# Patient Record
Sex: Female | Born: 1965 | Race: Black or African American | Hispanic: No | Marital: Married | Smoking: Never smoker
Health system: Southern US, Community
[De-identification: ages and names within clinical notes are randomized; demographics above are authoritative.]

## PROBLEM LIST (undated history)

## (undated) DIAGNOSIS — Z87442 Personal history of urinary calculi: Secondary | ICD-10-CM

## (undated) DIAGNOSIS — M2550 Pain in unspecified joint: Secondary | ICD-10-CM

## (undated) DIAGNOSIS — E079 Disorder of thyroid, unspecified: Secondary | ICD-10-CM

## (undated) DIAGNOSIS — E039 Hypothyroidism, unspecified: Secondary | ICD-10-CM

## (undated) DIAGNOSIS — G4733 Obstructive sleep apnea (adult) (pediatric): Secondary | ICD-10-CM

## (undated) DIAGNOSIS — G4730 Sleep apnea, unspecified: Secondary | ICD-10-CM

## (undated) DIAGNOSIS — T884XXA Failed or difficult intubation, initial encounter: Secondary | ICD-10-CM

## (undated) DIAGNOSIS — E049 Nontoxic goiter, unspecified: Secondary | ICD-10-CM

## (undated) DIAGNOSIS — M1990 Unspecified osteoarthritis, unspecified site: Secondary | ICD-10-CM

## (undated) DIAGNOSIS — Z9989 Dependence on other enabling machines and devices: Secondary | ICD-10-CM

## (undated) DIAGNOSIS — K219 Gastro-esophageal reflux disease without esophagitis: Secondary | ICD-10-CM

## (undated) DIAGNOSIS — Z9889 Other specified postprocedural states: Secondary | ICD-10-CM

## (undated) DIAGNOSIS — E785 Hyperlipidemia, unspecified: Secondary | ICD-10-CM

## (undated) DIAGNOSIS — I10 Essential (primary) hypertension: Secondary | ICD-10-CM

## (undated) DIAGNOSIS — K829 Disease of gallbladder, unspecified: Secondary | ICD-10-CM

## (undated) DIAGNOSIS — R112 Nausea with vomiting, unspecified: Secondary | ICD-10-CM

## (undated) DIAGNOSIS — R7303 Prediabetes: Secondary | ICD-10-CM

## (undated) HISTORY — DX: Morbid (severe) obesity due to excess calories: E66.01

## (undated) HISTORY — DX: Prediabetes: R73.03

## (undated) HISTORY — DX: Disease of gallbladder, unspecified: K82.9

## (undated) HISTORY — DX: Pain in unspecified joint: M25.50

## (undated) HISTORY — DX: Sleep apnea, unspecified: G47.30

## (undated) HISTORY — DX: Nontoxic goiter, unspecified: E04.9

## (undated) HISTORY — DX: Obstructive sleep apnea (adult) (pediatric): G47.33

## (undated) HISTORY — DX: Hyperlipidemia, unspecified: E78.5

## (undated) HISTORY — DX: Dependence on other enabling machines and devices: Z99.89

---

## 1997-12-17 DIAGNOSIS — T884XXA Failed or difficult intubation, initial encounter: Secondary | ICD-10-CM

## 1997-12-17 HISTORY — DX: Failed or difficult intubation, initial encounter: T88.4XXA

## 1998-03-03 ENCOUNTER — Other Ambulatory Visit: Admission: RE | Admit: 1998-03-03 | Discharge: 1998-03-03 | Payer: Self-pay | Admitting: Obstetrics and Gynecology

## 1998-08-24 ENCOUNTER — Inpatient Hospital Stay (HOSPITAL_COMMUNITY): Admission: AD | Admit: 1998-08-24 | Discharge: 1998-08-24 | Payer: Self-pay | Admitting: Obstetrics and Gynecology

## 1998-08-25 HISTORY — PX: TUBAL LIGATION: SHX77

## 1998-10-06 ENCOUNTER — Other Ambulatory Visit: Admission: RE | Admit: 1998-10-06 | Discharge: 1998-10-06 | Payer: Self-pay | Admitting: Obstetrics and Gynecology

## 1999-01-23 ENCOUNTER — Encounter: Payer: Self-pay | Admitting: Family Medicine

## 1999-01-23 ENCOUNTER — Ambulatory Visit (HOSPITAL_COMMUNITY): Admission: RE | Admit: 1999-01-23 | Discharge: 1999-01-23 | Payer: Self-pay | Admitting: Family Medicine

## 2002-02-04 ENCOUNTER — Encounter: Admission: RE | Admit: 2002-02-04 | Discharge: 2002-02-04 | Payer: Self-pay | Admitting: Family Medicine

## 2002-03-11 ENCOUNTER — Encounter: Payer: Self-pay | Admitting: *Deleted

## 2002-03-11 ENCOUNTER — Encounter: Admission: RE | Admit: 2002-03-11 | Discharge: 2002-03-11 | Payer: Self-pay | Admitting: *Deleted

## 2002-04-06 ENCOUNTER — Encounter (INDEPENDENT_AMBULATORY_CARE_PROVIDER_SITE_OTHER): Payer: Self-pay | Admitting: Specialist

## 2002-04-06 ENCOUNTER — Ambulatory Visit (HOSPITAL_COMMUNITY): Admission: RE | Admit: 2002-04-06 | Discharge: 2002-04-06 | Payer: Self-pay | Admitting: *Deleted

## 2002-04-06 ENCOUNTER — Encounter: Payer: Self-pay | Admitting: *Deleted

## 2002-04-22 ENCOUNTER — Encounter (INDEPENDENT_AMBULATORY_CARE_PROVIDER_SITE_OTHER): Payer: Self-pay | Admitting: *Deleted

## 2002-04-22 ENCOUNTER — Encounter: Admission: RE | Admit: 2002-04-22 | Discharge: 2002-04-22 | Payer: Self-pay | Admitting: Family Medicine

## 2002-07-14 ENCOUNTER — Encounter: Admission: RE | Admit: 2002-07-14 | Discharge: 2002-07-14 | Payer: Self-pay | Admitting: Family Medicine

## 2002-09-10 ENCOUNTER — Encounter: Admission: RE | Admit: 2002-09-10 | Discharge: 2002-09-10 | Payer: Self-pay | Admitting: Family Medicine

## 2002-09-16 ENCOUNTER — Encounter: Admission: RE | Admit: 2002-09-16 | Discharge: 2002-09-16 | Payer: Self-pay | Admitting: Family Medicine

## 2002-09-16 ENCOUNTER — Ambulatory Visit (HOSPITAL_COMMUNITY): Admission: RE | Admit: 2002-09-16 | Discharge: 2002-09-16 | Payer: Self-pay | Admitting: Family Medicine

## 2002-09-25 ENCOUNTER — Encounter: Payer: Self-pay | Admitting: Family Medicine

## 2002-09-25 ENCOUNTER — Inpatient Hospital Stay (HOSPITAL_COMMUNITY): Admission: AD | Admit: 2002-09-25 | Discharge: 2002-09-29 | Payer: Self-pay | Admitting: Family Medicine

## 2002-09-25 ENCOUNTER — Encounter: Admission: RE | Admit: 2002-09-25 | Discharge: 2002-09-25 | Payer: Self-pay | Admitting: Family Medicine

## 2002-10-05 ENCOUNTER — Encounter: Admission: RE | Admit: 2002-10-05 | Discharge: 2002-10-05 | Payer: Self-pay | Admitting: Family Medicine

## 2002-10-09 ENCOUNTER — Encounter (INDEPENDENT_AMBULATORY_CARE_PROVIDER_SITE_OTHER): Payer: Self-pay | Admitting: *Deleted

## 2002-10-09 ENCOUNTER — Inpatient Hospital Stay (HOSPITAL_COMMUNITY): Admission: RE | Admit: 2002-10-09 | Discharge: 2002-10-11 | Payer: Self-pay | Admitting: Surgery

## 2002-10-30 HISTORY — PX: THYROIDECTOMY: SHX17

## 2002-12-07 ENCOUNTER — Encounter: Admission: RE | Admit: 2002-12-07 | Discharge: 2002-12-07 | Payer: Self-pay | Admitting: Family Medicine

## 2002-12-24 ENCOUNTER — Encounter: Admission: RE | Admit: 2002-12-24 | Discharge: 2002-12-24 | Payer: Self-pay | Admitting: Family Medicine

## 2003-01-04 ENCOUNTER — Encounter: Admission: RE | Admit: 2003-01-04 | Discharge: 2003-01-04 | Payer: Self-pay | Admitting: Family Medicine

## 2003-02-17 ENCOUNTER — Encounter: Admission: RE | Admit: 2003-02-17 | Discharge: 2003-02-17 | Payer: Self-pay | Admitting: Sports Medicine

## 2003-03-10 ENCOUNTER — Encounter: Admission: RE | Admit: 2003-03-10 | Discharge: 2003-03-10 | Payer: Self-pay | Admitting: Family Medicine

## 2003-05-30 ENCOUNTER — Emergency Department (HOSPITAL_COMMUNITY): Admission: EM | Admit: 2003-05-30 | Discharge: 2003-05-31 | Payer: Self-pay | Admitting: Emergency Medicine

## 2003-05-31 ENCOUNTER — Encounter: Payer: Self-pay | Admitting: Emergency Medicine

## 2003-08-13 ENCOUNTER — Encounter: Admission: RE | Admit: 2003-08-13 | Discharge: 2003-08-13 | Payer: Self-pay | Admitting: Family Medicine

## 2003-09-15 ENCOUNTER — Encounter: Admission: RE | Admit: 2003-09-15 | Discharge: 2003-09-15 | Payer: Self-pay | Admitting: Family Medicine

## 2004-03-06 ENCOUNTER — Encounter: Admission: RE | Admit: 2004-03-06 | Discharge: 2004-03-06 | Payer: Self-pay | Admitting: Family Medicine

## 2004-11-24 ENCOUNTER — Ambulatory Visit: Payer: Self-pay | Admitting: Sports Medicine

## 2004-12-14 ENCOUNTER — Ambulatory Visit: Payer: Self-pay | Admitting: Family Medicine

## 2005-01-03 ENCOUNTER — Ambulatory Visit: Payer: Self-pay | Admitting: Family Medicine

## 2005-03-28 ENCOUNTER — Encounter: Admission: RE | Admit: 2005-03-28 | Discharge: 2005-03-28 | Payer: Self-pay | Admitting: Sports Medicine

## 2005-04-06 ENCOUNTER — Encounter (INDEPENDENT_AMBULATORY_CARE_PROVIDER_SITE_OTHER): Payer: Self-pay | Admitting: Specialist

## 2005-04-06 ENCOUNTER — Ambulatory Visit: Payer: Self-pay | Admitting: Sports Medicine

## 2005-04-10 ENCOUNTER — Ambulatory Visit: Payer: Self-pay | Admitting: Sports Medicine

## 2005-04-19 ENCOUNTER — Ambulatory Visit: Payer: Self-pay | Admitting: Family Medicine

## 2005-07-12 ENCOUNTER — Ambulatory Visit: Payer: Self-pay | Admitting: Family Medicine

## 2005-08-03 ENCOUNTER — Ambulatory Visit: Payer: Self-pay | Admitting: Family Medicine

## 2006-02-12 ENCOUNTER — Ambulatory Visit: Payer: Self-pay | Admitting: Family Medicine

## 2006-08-16 ENCOUNTER — Encounter: Admission: RE | Admit: 2006-08-16 | Discharge: 2006-08-16 | Payer: Self-pay | Admitting: *Deleted

## 2006-11-15 HISTORY — PX: BREAST SURGERY: SHX581

## 2009-12-05 ENCOUNTER — Emergency Department (HOSPITAL_COMMUNITY): Admission: EM | Admit: 2009-12-05 | Discharge: 2009-12-05 | Payer: Self-pay | Admitting: Emergency Medicine

## 2011-01-07 ENCOUNTER — Encounter: Payer: Self-pay | Admitting: *Deleted

## 2011-03-19 LAB — DIFFERENTIAL
Basophils Absolute: 0 10*3/uL (ref 0.0–0.1)
Basophils Relative: 0 % (ref 0–1)
Lymphocytes Relative: 21 % (ref 12–46)
Monocytes Relative: 5 % (ref 3–12)
Neutro Abs: 5.7 10*3/uL (ref 1.7–7.7)
Neutrophils Relative %: 73 % (ref 43–77)

## 2011-03-19 LAB — COMPREHENSIVE METABOLIC PANEL
Alkaline Phosphatase: 70 U/L (ref 39–117)
BUN: 15 mg/dL (ref 6–23)
Creatinine, Ser: 0.97 mg/dL (ref 0.4–1.2)
Glucose, Bld: 126 mg/dL — ABNORMAL HIGH (ref 70–99)
Potassium: 4.5 mEq/L (ref 3.5–5.1)
Total Bilirubin: 1.3 mg/dL — ABNORMAL HIGH (ref 0.3–1.2)
Total Protein: 7.5 g/dL (ref 6.0–8.3)

## 2011-03-19 LAB — URINALYSIS, ROUTINE W REFLEX MICROSCOPIC
Bilirubin Urine: NEGATIVE
Glucose, UA: NEGATIVE mg/dL
Ketones, ur: NEGATIVE mg/dL
Protein, ur: 30 mg/dL — AB
pH: 5.5 (ref 5.0–8.0)

## 2011-03-19 LAB — GC/CHLAMYDIA PROBE AMP, GENITAL
Chlamydia, DNA Probe: NEGATIVE
GC Probe Amp, Genital: NEGATIVE

## 2011-03-19 LAB — CBC
HCT: 36.7 % (ref 36.0–46.0)
Hemoglobin: 12.4 g/dL (ref 12.0–15.0)
MCV: 91.6 fL (ref 78.0–100.0)
RDW: 13.7 % (ref 11.5–15.5)

## 2011-03-19 LAB — URINE MICROSCOPIC-ADD ON

## 2011-03-19 LAB — WET PREP, GENITAL

## 2011-05-02 ENCOUNTER — Encounter: Payer: Self-pay | Admitting: Family Medicine

## 2011-05-02 ENCOUNTER — Ambulatory Visit (INDEPENDENT_AMBULATORY_CARE_PROVIDER_SITE_OTHER): Payer: Medicaid Other | Admitting: Family Medicine

## 2011-05-02 VITALS — BP 141/93 | HR 102 | Temp 98.6°F | Ht 68.0 in | Wt 325.8 lb

## 2011-05-02 DIAGNOSIS — I10 Essential (primary) hypertension: Secondary | ICD-10-CM | POA: Insufficient documentation

## 2011-05-02 DIAGNOSIS — E669 Obesity, unspecified: Secondary | ICD-10-CM

## 2011-05-02 DIAGNOSIS — G4733 Obstructive sleep apnea (adult) (pediatric): Secondary | ICD-10-CM | POA: Insufficient documentation

## 2011-05-02 DIAGNOSIS — E890 Postprocedural hypothyroidism: Secondary | ICD-10-CM | POA: Insufficient documentation

## 2011-05-02 DIAGNOSIS — G4730 Sleep apnea, unspecified: Secondary | ICD-10-CM

## 2011-05-02 MED ORDER — LEVOTHYROXINE SODIUM 125 MCG PO TABS: 125.0000 ug | ORAL_TABLET | Freq: Every day | ORAL | Status: DC

## 2011-05-02 MED ORDER — HYDROCHLOROTHIAZIDE 25 MG PO TABS: 25.0000 mg | ORAL_TABLET | Freq: Every day | ORAL | Status: DC

## 2011-05-02 MED ORDER — ENALAPRIL MALEATE 20 MG PO TABS: 20.0000 mg | ORAL_TABLET | Freq: Two times a day (BID) | ORAL | Status: DC

## 2011-05-02 NOTE — Progress Notes (Signed)
  Subjective:    Patient ID: Paula Austin, female    DOB: 12-16-1966, 45 y.o.   MRN: [id number]  HPI Establishing care. History of hypertension and total thyroidectomy. Wt up since thyroidectomy Also had old sleep study showing "severe sleep apnea", but never given CPAP No complaints - feels well except would like to lose wt Switching due to financial reasons    Review of Systems     Objective:   Physical Exam Neck thyroidectomy scar well healed Cardiac RRR without m Abd benign.       Assessment & Plan:

## 2011-05-03 LAB — COMPLETE METABOLIC PANEL WITH GFR
AST: 14 U/L (ref 0–37)
Albumin: 4.2 g/dL (ref 3.5–5.2)
Alkaline Phosphatase: 78 U/L (ref 39–117)
Potassium: 4.8 mEq/L (ref 3.5–5.3)
Sodium: 136 mEq/L (ref 135–145)
Total Protein: 7.6 g/dL (ref 6.0–8.3)

## 2011-05-04 ENCOUNTER — Encounter: Payer: Self-pay | Admitting: Family Medicine

## 2011-05-04 NOTE — Assessment & Plan Note (Signed)
Sleep study, sounds like she really needs CPAP,  Snore, children say she stops breathing, has excessive daytime sleepiness.

## 2011-05-04 NOTE — Op Note (Signed)
NAME:  Paula Austin, Paula Austin                       ACCOUNT NO.:  [id number]   MEDICAL RECORD NO.:  [id number]                   PATIENT TYPE:  OIB   LOCATION:  2550                                 FACILITY:  [facility]   PHYSICIAN:  Velora Heckler, M.D.                DATE OF BIRTH:  01/14/1966   DATE OF PROCEDURE:  10/09/2002  DATE OF DISCHARGE:                                 OPERATIVE REPORT   PREOPERATIVE DIAGNOSIS:  Thyroid goiter with atypia.   POSTOPERATIVE DIAGNOSIS:  Thyroid goiter with atypia.   PROCEDURE:  Total thyroidectomy.   SURGEON:  Velora Heckler, M.D.   ASSISTANT:  Gita Kudo, M.D.   ANESTHESIA:  General per Maren Beach, M.D.   ESTIMATED BLOOD LOSS:  500 cc.   PREPARATION:  Betadine.   COMPLICATIONS:  None.   DRAINS:  48 [place].   INDICATIONS:  The patient is a 45 year old black female who presents with  thyroid goiter.  This has been present for approximately four years.  It has  slowly progressed in size.  The patient has developed compressive symptoms.  She had a recent admission to [facility] for thyroid storm.  She is  currently on PTU and beta blockade.  The patient had undergone fine needle  aspiration cytology.  This showed moderate nuclear irregularity and  microfollicles.  The possibility of a papillary lesion in the left thyroid  lobe was raised.  The patient now comes to surgery for total thyroidectomy.   DESCRIPTION OF PROCEDURE:  The procedure was done in OR #15 at the [facility].  The patient is brought to the operating room and  placed in a supine position on the operating room table.  Following  administration of general anesthesia, the patient is prepped and draped in  the usual strict aseptic fashion.  This is a large anterior goiter.  After  ascertaining that an adequate level of anesthesia had been obtained, a  transverse Kocher incision is made with a #11 blade.  Dissection was carried  down through the subcutaneous tissues and platysma and hemostasis obtained  with the electrocautery.  Skin flaps are developed cephalad and caudad.  A  Mahorner self-retaining retractor is placed for exposure.  Strap muscles are  incised in the midline and dissection is begun on the left side of the neck.  Strap muscles are reflected laterally.  The gland is quite large anteriorly.  It extends posteriorly and superiorly a great distance.  This is a quite  large goiter.  Venous tributaries are dissected out and ligated in  continuity with 3-0 silk ties and divided.  The gland is rolled anteriorly.  Inferior venous tributaries are ligated in continuity with 3-0 silk ties and  divided.  The superior pole was dissected out.  Again large venous  tributaries are ligated in continuity with 2-0 silk ties and div died.  Smaller vessels are divided between medium Ligaclips.  The superior thyroid  artery is ligated in continuity with 2-0 silk ties as well as medium  Ligaclips and then divided.  The gland is rolled anteriorly.  The superior  and inferior parathyroid glands are identified on the left side, and both  glands are preserved on their vascular pedicles.  The recurrent laryngeal  nerve was identified and preserved.  Branches of the inferior thyroid artery  are divided between small and medium Ligaclips.  The gland was rolled  anteriorly.  The tubercle of Zuckerkandl is dissected out.  Ligament of  Allyson Sabal is transected, and the gland is rolled up and onto the anterior  trachea.  Venous tributaries coming from the superior midline are also  divided between medium Ligaclips.  Hemostasis is obtained with the  electrocautery.  Next we turned our attention to the right side.  Again  strap muscles are reflected laterally.  Strap muscles are markedly  attenuated due to the size of the goiter.  Venous tributaries are ligated in  continuity with 2-0 silk ties and divided.  The gland is rolled  anteriorly.  Venous tributaries to the superior pole are ligated in continuity between 2-  0 silk ties and div died.  Superior pole vessels are dissected out and  ligated between 2-0 silk ties and divided.  The smaller vessels are divided  between medium Ligaclips.  The gland is rolled further anteriorly.  There is  moderate back bleeding at this point from the large venous collaterals on  the surface of the thyroid gland.  This makes visualization difficult.  Despite multiple attempts at a meticulous dissection on the right neck,  visualization cannot be maintained.  Therefore, a decision is made to leave  a small rim of thyroid tissue in the area of the parathyroid glands and  recurrent nerve.  Therefore, the thyroid tissue is divided between hemostats  near the trachea.  The inferior thyroid veins are ligated with 2-0 silk  ties.  The gland is then excised off the anterior trachea and anterior  thyroid cartilage in its entirety.  The gland is inspected and then the left  superior pole marked with a suture ligature and submitted to pathology for  review.  Hemostats on the residual thyroid tissue are suture ligated with 3-  0 Vicryl suture ligatures.  Good hemostasis is noted.  The neck is irrigated  on both sides.  The venous collaterals are ligated with 2-0 silk ties.  Surgicel is placed over the area of the recurrent laryngeal nerve  bilaterally.  Good hemostasis is noted.  A 15 [place] Blake drain is brought  in from a left lateral stab wound and placed into the thyroid bed.  Strap  muscles are then reapproximated in the midline with interrupted 3-0 Vicryl  sutures.  Platysma is reapproximated with interrupted 3-0 Vicryl sutures.  Skin edges are reapproximated with widely-spaced stainless steel staples and  interspaced Benzoin and Steri-Strips.  Sterile gauze dressings were applied.  The drain was placed to bulb suction.  The patient is awakened from anesthesia and brought to the  recovery room in stable condition.  The  patient tolerated the procedure well.                                               Velora Heckler, M.D.  TMG/MEDQ  D:  10/09/2002  T:  10/09/2002  Job:  [id number]   cc:   Solon Palm, D.O.

## 2011-05-04 NOTE — Assessment & Plan Note (Signed)
Refill meds, check TSH

## 2011-05-04 NOTE — Assessment & Plan Note (Signed)
Poor control, increase enalapril

## 2011-05-04 NOTE — Discharge Summary (Signed)
NAME:  MOZELL, HABER                       ACCOUNT NO.:  [id number]   MEDICAL RECORD NO.:  [id number]                   PATIENT TYPE:  INP   LOCATION:  3710                                 FACILITY:  [facility]   PHYSICIAN:  Leighton Roach McDiarmid, M.D.             DATE OF BIRTH:  14-Nov-1966   DATE OF ADMISSION:  09/25/2002  DATE OF DISCHARGE:  09/29/2002                                 DISCHARGE SUMMARY   DISCHARGE DIAGNOSES:  1. 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  2. Thyroid goiter.  3. Gastroesophageal reflux disease.   DISCHARGE MEDICATIONS:  1. Propylthiouracil 15 mg 4 pills p.o. t.i.d. x 10 days.  2. Metoprolol 150 mg 1 tab p.o. b.i.d. x 10 days.  3. __________ solution or SSKI 5 drops under the tongue t.i.d. x 10 days.   FOLLOW UP:  She will be discharged with an appointment to see Dr. Renold Don at the  [facility] [facility] on October 05, 2002, at 10:05 a.m. and Dr. Ardine Eng  office will call her to set up an appointment prior to her surgery which is  scheduled for October 09, 2002.   HOSPITAL COURSE:  Ms. [name] is a 45 year old female, recently diagnosed  with hypothyroidism with a goiter, status post fine needle aspiration which  showed atypical cells, who is scheduled for a thyroidectomy on October 09, 2002, who over the past couple of weeks has had increased agitation and  inability to sleep as well as tremor and persistent vomiting and anorexia.  The patient had a 16 pound weight loss over the course of two weeks prior to  admission, complains of heart racing, palpitations, vomiting, and  tremulousness over the course of the two weeks prior to hospitalization.  Please see admission H&P for further details.   PHYSICAL EXAMINATION ON ADMISSION:  VITAL SIGNS:  Temperature 97.5, heart  rate 100, blood pressure 136/80.  GENERAL:  Well-developed, well-nourished female.  Of note, on physical exam  at admission, the patient has no proptosis, a large midline tissue swelling  on her neck  which is not tender.  LUNGS:  Clear to auscultation bilaterally.  CARDIOVASCULAR:  She is tachycardic with 2/6 systolic ejection murmur.  ABDOMEN:  She is obese.   LABORATORY DATA:  TSH on September 16, 2002, was less than 0.004.  EKG on  September 16, 2002, showed sinus tachycardia with no acute abnormalities.   PROBLEM LIST:  1. Hypothyroidism with thyroid storm.  T4 and T3 were extremely elevated,     her T4 being 28.  Free T4 being 8.54.  Her T3 being 680.1.  The patient     was admitted, placed on beta blockers which were titrated up to alleviate     symptoms.  She was then placed on PTU to help manage.  Dr. Gerrit Friends was     contacted, and he requested that we place the patient on __________     solution or SSKI prior  to surgery and that we should get some lab work     prior to her surgery and that he would see her a couple of days before     her surgery to make sure she was not symptomatic.  On the day of     discharge, the patient was no longer tremulous and maintained heart rates     under 105.  Felt that she would be comfortable going home and increasing     her beta blocker as an outpatient if necessary.  2. Gastroesophageal reflux disease.  The patient is currently controlled     with Protonix.  This will be continued as an outpatient.   SUGGESTED FOLLOW UP ITEMS:  The patient has an appointment with Dr. Renold Don, on  October 05, 2002.  It is suggested that he repeat her lab work at that time  and cc a copy of it to Dr. Gerrit Friends.     Maylon Peppers Waynette Buttery, M.D.                     Etta Grandchild, M.D.    SAG/MEDQ  D:  09/29/2002  T:  09/30/2002  Job:  [id number]   cc:   Emmit Alexanders, M.D.  [address]  [place], [place] [address]  Fax: [phone]   Velora Heckler, M.D.  Fax: [phone]

## 2011-05-09 ENCOUNTER — Telehealth: Payer: Self-pay | Admitting: Family Medicine

## 2011-05-09 NOTE — Telephone Encounter (Signed)
Checking status of referral to get a sleep study

## 2011-05-09 NOTE — Telephone Encounter (Signed)
Informed pt that the sleep study lab will call her about the date and time of her appt.Marland KitchenMarland KitchenLoralee Pacas Austin

## 2011-06-13 ENCOUNTER — Ambulatory Visit (HOSPITAL_BASED_OUTPATIENT_CLINIC_OR_DEPARTMENT_OTHER): Payer: Medicaid Other | Attending: Family Medicine

## 2011-06-13 DIAGNOSIS — I491 Atrial premature depolarization: Secondary | ICD-10-CM | POA: Insufficient documentation

## 2011-06-13 DIAGNOSIS — G4733 Obstructive sleep apnea (adult) (pediatric): Secondary | ICD-10-CM | POA: Insufficient documentation

## 2011-06-13 DIAGNOSIS — G4730 Sleep apnea, unspecified: Secondary | ICD-10-CM

## 2011-06-23 DIAGNOSIS — G4733 Obstructive sleep apnea (adult) (pediatric): Secondary | ICD-10-CM

## 2011-06-23 DIAGNOSIS — I491 Atrial premature depolarization: Secondary | ICD-10-CM

## 2011-06-23 NOTE — Procedures (Signed)
NAMELUVA, METZGER             ACCOUNT NO.:  [id number]  MEDICAL RECORD NO.:  [id number]          PATIENT TYPE:  OUT  LOCATION:  SLEEP CENTER                 FACILITY:  [facility]  PHYSICIAN:  Clinton D. Maple Hudson, MD, FCCP, FACPDATE OF BIRTH:  [birth year] 09, 1966  DATE OF STUDY:  06/13/2011                           NOCTURNAL POLYSOMNOGRAM  REFERRING PHYSICIAN:  Richrd Prime HENSEL  INDICATION FOR STUDY:  Hypersomnia with sleep apnea.  EPWORTH SLEEPINESS SCORE:  18/24, BMI 48.7.  Weight 320 pounds, height 68 inches.  Neck 20 inches.  MEDICATIONS:  Home medications are charted and reviewed.  SLEEP ARCHITECTURE:  Split-study protocol.  During the diagnostic phase, total sleep time 147 minutes with sleep efficiency 79.9%.  Stage I was 24.5%, stage II 46.3%, stage III absent, REM 29.3% of total sleep time. Sleep latency 14.5 minutes, REM latency 39 minutes, awake after sleep onset 21.5 minutes, arousal index 78.  BEDTIME MEDICATION:  None.  RESPIRATORY DATA:  Split-study protocol.  Apnea-hypopnea index (AHI) 101.2 per hour.  A total of 248 events was scored including 177 obstructive apneas, 20 mixed apneas and 51 hypopneas.  The events were not positional.  REM/AHI 100.5 per hour.  CPAP was then titrated to 17 CWP, AHI 0 per hour.  She wore a medium ResMed [employer] FX full- face mask with heated humidifier and C-flex of 3.  OXYGEN DATA:  Before CPAP, snoring was moderately loud with oxygen desaturation to a nadir of 68% on room air.  With CPAP titration, snoring was prevented and mean oxygen saturation held 95% on room air.  CARDIAC DATA:  Sinus rhythm with occasional PAC.  MOVEMENT-PARASOMNIA:  No significant movement disturbance.  Bathroom x1.  IMPRESSIONS-RECOMMENDATIONS: 1. Severe obstructive sleep apnea/hypopnea syndrome, AHI 101.2 per     hour with non positional events, moderately loud snoring at oxygen     desaturation to a nadir of 68% on room air. 2. Successful CPAP  titration to 17 CWP, AHI 0 per hour.  She wore a     medium ResMed [employer]age Quattro FX full-face mask with heated     humidifier and a C-Flex setting of 3.     Clinton D. Maple Hudson, MD, [facility], FACP Diplomate, Biomedical engineer of Sleep Medicine Electronically Signed    CDY/MEDQ  D:  06/23/2011 11:52:35  T:  06/23/2011 13:18:30  Job:  [id number]

## 2011-06-25 ENCOUNTER — Telehealth: Payer: Self-pay | Admitting: Family Medicine

## 2011-06-25 DIAGNOSIS — G4730 Sleep apnea, unspecified: Secondary | ICD-10-CM

## 2011-06-25 NOTE — Telephone Encounter (Signed)
Pt is requesting sleep study results. Would like message left on her vm since she is going to work.

## 2011-06-26 NOTE — Assessment & Plan Note (Signed)
Severe sleep apnea based on sleep study of 05/2011

## 2011-06-26 NOTE — Telephone Encounter (Signed)
Exam results show severe sleep apnea.  Will put order in for [facility] to fit for home CPAP.

## 2011-06-27 ENCOUNTER — Telehealth: Payer: Self-pay | Admitting: Family Medicine

## 2011-06-27 NOTE — Telephone Encounter (Signed)
Dr. Leveda Anna, Can you please write an rx for this. ---[place] Dec

## 2011-06-27 NOTE — Telephone Encounter (Signed)
Paula Austin has called again about the prescription for the CPAP and a copy of the sleep study.

## 2011-06-27 NOTE — Telephone Encounter (Signed)
rec'd referral but needs a copy of sleep study and also needs rx script for the CPAP pls fax to [phone] attn [place]

## 2011-06-27 NOTE — Telephone Encounter (Signed)
done

## 2011-06-27 NOTE — Telephone Encounter (Signed)
Faxed out

## 2011-06-27 NOTE — Telephone Encounter (Signed)
LM on patient's VM informing below. I am faxing referral to [facility].

## 2011-11-23 ENCOUNTER — Ambulatory Visit (INDEPENDENT_AMBULATORY_CARE_PROVIDER_SITE_OTHER): Payer: Medicaid Other | Admitting: Family Medicine

## 2011-11-23 ENCOUNTER — Other Ambulatory Visit (HOSPITAL_COMMUNITY)
Admission: RE | Admit: 2011-11-23 | Discharge: 2011-11-23 | Disposition: A | Discharge status: Home / Self Care | Payer: Medicaid Other | Source: Ambulatory Visit | Attending: Family Medicine | Admitting: Family Medicine

## 2011-11-23 VITALS — BP 140/89 | HR 102 | Temp 97.7°F | Ht 68.0 in | Wt 327.0 lb

## 2011-11-23 DIAGNOSIS — Z01419 Encounter for gynecological examination (general) (routine) without abnormal findings: Secondary | ICD-10-CM | POA: Insufficient documentation

## 2011-11-23 DIAGNOSIS — Z124 Encounter for screening for malignant neoplasm of cervix: Secondary | ICD-10-CM

## 2011-11-23 DIAGNOSIS — E890 Postprocedural hypothyroidism: Secondary | ICD-10-CM

## 2011-11-23 DIAGNOSIS — I10 Essential (primary) hypertension: Secondary | ICD-10-CM

## 2011-11-23 DIAGNOSIS — Z23 Encounter for immunization: Secondary | ICD-10-CM

## 2011-11-23 DIAGNOSIS — N912 Amenorrhea, unspecified: Secondary | ICD-10-CM | POA: Insufficient documentation

## 2011-11-23 DIAGNOSIS — Z1239 Encounter for other screening for malignant neoplasm of breast: Secondary | ICD-10-CM

## 2011-11-23 DIAGNOSIS — Z1231 Encounter for screening mammogram for malignant neoplasm of breast: Secondary | ICD-10-CM

## 2011-11-23 LAB — CBC
HCT: 39.2 % (ref 36.0–46.0)
Hemoglobin: 12.9 g/dL (ref 12.0–15.0)
MCV: 91 fL (ref 78.0–100.0)
RBC: 4.31 MIL/uL (ref 3.87–5.11)
WBC: 8.5 10*3/uL (ref 4.0–10.5)

## 2011-11-23 LAB — LIPID PANEL
HDL: 58 mg/dL (ref >39)
LDL Cholesterol: 122 mg/dL — ABNORMAL HIGH (ref 0–99)
Total CHOL/HDL Ratio: 3.4 Ratio

## 2011-11-23 MED ORDER — ASPIRIN EC 325 MG PO TBEC: 325.0000 mg | DELAYED_RELEASE_TABLET | Freq: Every day | ORAL | Status: AC

## 2011-11-23 NOTE — Progress Notes (Signed)
  Subjective:    Patient ID: Paula Austin, female    DOB: [birth year], 45 y.o.   MRN: [id number]  HPI Las menstrual period about 3.5 months ago.  Not sexually active.  Periods were regular until then. Is having some flushing of face and tingling of arms that could be consistent with a hot flash.    Wonders if her thyroid might be abnormal.  She had previous similar tingling and flushing which was attributed to thyroid problems.  She is exercising more and eating healthier.  Unfortunately, this has not yet translated to wt loss.    Review of Systems     Objective:   Physical Exam HEENT normal Neck supple Cardiac RRR without m Lungs clear abd benign. Pelvic and Pap done.  Grossly normal but limited sensitivity of  bimanual due to her obesity.       Assessment & Plan:

## 2011-11-23 NOTE — Patient Instructions (Addendum)
Please start taking one aspirin per day.   Good job on exercising more. I will call with lab results.  The likely problem is either with your thyroid or that you are menopausal or perimenopausal. Great job with more exercise.  This will help even if you do not lose weight. Please get your mammogram [facility] [place] [place], [place]

## 2011-11-24 LAB — BASIC METABOLIC PANEL WITH GFR
Calcium: 9.8 mg/dL (ref 8.4–10.5)
GFR, Est African American: >89 mL/min
Glucose, Bld: 92 mg/dL (ref 70–99)
Sodium: 136 mEq/L (ref 135–145)

## 2011-11-27 ENCOUNTER — Encounter: Payer: Self-pay | Admitting: Family Medicine

## 2011-11-28 ENCOUNTER — Encounter: Payer: Self-pay | Admitting: Family Medicine

## 2011-11-28 NOTE — Assessment & Plan Note (Signed)
Likely perimenopausal.  Will check FSH and TSH.

## 2011-11-28 NOTE — Assessment & Plan Note (Signed)
Given risk factors, will add daily aspirin to regimine.

## 2011-11-28 NOTE — Assessment & Plan Note (Signed)
Check TSH 

## 2011-12-21 ENCOUNTER — Ambulatory Visit
Admission: RE | Admit: 2011-12-21 | Discharge: 2011-12-21 | Disposition: A | Discharge status: Home / Self Care | Payer: Medicaid Other | Source: Ambulatory Visit | Attending: Family Medicine | Admitting: Family Medicine

## 2011-12-21 DIAGNOSIS — Z1231 Encounter for screening mammogram for malignant neoplasm of breast: Secondary | ICD-10-CM

## 2012-06-05 ENCOUNTER — Other Ambulatory Visit: Payer: Self-pay | Admitting: Family Medicine

## 2012-06-24 ENCOUNTER — Ambulatory Visit: Payer: Medicaid Other | Admitting: Family Medicine

## 2012-12-01 ENCOUNTER — Encounter (HOSPITAL_COMMUNITY): Payer: Self-pay | Admitting: Adult Health

## 2012-12-01 ENCOUNTER — Emergency Department (HOSPITAL_COMMUNITY)
Admission: EM | Admit: 2012-12-01 | Discharge: 2012-12-01 | Disposition: A | Discharge status: Home / Self Care | Payer: Self-pay | Attending: Emergency Medicine | Admitting: Emergency Medicine

## 2012-12-01 DIAGNOSIS — E079 Disorder of thyroid, unspecified: Secondary | ICD-10-CM | POA: Insufficient documentation

## 2012-12-01 DIAGNOSIS — M545 Low back pain, unspecified: Secondary | ICD-10-CM | POA: Insufficient documentation

## 2012-12-01 DIAGNOSIS — Z79899 Other long term (current) drug therapy: Secondary | ICD-10-CM | POA: Insufficient documentation

## 2012-12-01 DIAGNOSIS — I10 Essential (primary) hypertension: Secondary | ICD-10-CM | POA: Insufficient documentation

## 2012-12-01 HISTORY — DX: Disorder of thyroid, unspecified: E07.9

## 2012-12-01 HISTORY — DX: Essential (primary) hypertension: I10

## 2012-12-01 LAB — URINALYSIS, ROUTINE W REFLEX MICROSCOPIC
Hgb urine dipstick: NEGATIVE
Nitrite: NEGATIVE
Protein, ur: NEGATIVE mg/dL
Urobilinogen, UA: 1 mg/dL (ref 0.0–1.0)

## 2012-12-01 LAB — URINE MICROSCOPIC-ADD ON

## 2012-12-01 MED ORDER — KETOROLAC TROMETHAMINE 60 MG/2ML IM SOLN
60.0000 mg | Freq: Once | INTRAMUSCULAR | Status: AC
Start: 2012-12-01 — End: 2012-12-01
  Administered 2012-12-01: 60 mg via INTRAMUSCULAR
  Filled 2012-12-01: qty 2

## 2012-12-01 MED ORDER — DIAZEPAM 5 MG PO TABS
10.0000 mg | ORAL_TABLET | Freq: Once | ORAL | Status: AC
  Administered 2012-12-01: 10 mg via ORAL
  Filled 2012-12-01: qty 1

## 2012-12-01 MED ORDER — METHOCARBAMOL 500 MG PO TABS: 1000.0000 mg | ORAL_TABLET | Freq: Four times a day (QID) | ORAL | Status: DC | PRN

## 2012-12-01 MED ORDER — DIAZEPAM 5 MG PO TABS
ORAL_TABLET | ORAL | Status: AC
  Filled 2012-12-01: qty 1

## 2012-12-01 MED ORDER — TRAMADOL-ACETAMINOPHEN 37.5-325 MG PO TABS: ORAL_TABLET | ORAL | Status: DC

## 2012-12-01 MED ORDER — PREDNISONE 20 MG PO TABS: ORAL_TABLET | ORAL | Status: DC

## 2012-12-01 NOTE — ED Notes (Signed)
Family at bedside. 

## 2012-12-01 NOTE — ED Notes (Signed)
PT reports back pain started after bending over in the shower. Pt reports a neg. Hx of back injuries.

## 2012-12-01 NOTE — ED Notes (Signed)
Presents with lumbar back pain that began yesterday after getting out of shower pain is described as constant and cramping and is worse with bending over and waliking. Pain radiates to lower abdomen. Pt denies injury to back. Denies urinary symptoms. Pain is rated 6/10.  Pressure against back makes pain better.

## 2012-12-01 NOTE — ED Provider Notes (Signed)
History   This chart was scribed for Ward Givens, MD by Gerlean Ren, ED Scribe. This patient was seen in room TR07C/TR07C and the patient's care was started at 4:31 PM    CSN: [id number]  Arrival date & time 12/01/12  1443   First MD Initiated Contact with Patient 12/01/12 1606      Chief Complaint  Patient presents with  . Back Pain    The history is provided by the patient. No language interpreter was used.   MARIEA MCMARTIN is a 46 y.o. female with h/o HTN and thyroid disease who presents to the Emergency Department complaining of sudden onset, constant, moderate, aching, non-radiating lower back pain that began yesterday morning while bending down in the shower to wash her feet  and is worsened when changing positions and improved when she presses on her back while laying down and by warm compress and Icy-Hot compress.  Pt denies urinary or rectal incontinence and numbness or weakness in extremities.  Pain does not radiate. Pt denies any h/o similar back problems.  Pt denies tobacco and alcohol use.    PCP is Dr. Leveda Anna [facility]  Past Medical History  Diagnosis Date  . Hypertension   . Thyroid disease     History reviewed. No pertinent past surgical history.  History reviewed. No pertinent family history.  History  Substance Use Topics  . Smoking status: Never Smoker   . Smokeless tobacco: Never Used  . Alcohol Use: occassional  employed  No OB history provided.   Review of Systems  HENT: Negative for neck pain.   Musculoskeletal: Positive for back pain.  Neurological: Negative for weakness and numbness.    Allergies  Review of patient's allergies indicates no known allergies.  Home Medications   Current Outpatient Rx  Name  Route  Sig  Dispense  Refill  . VITAMIN D 1000 UNITS PO TABS   Oral   Take 1,000 Units by mouth daily. OTC med          . ENALAPRIL MALEATE 20 MG PO TABS   Oral   Take 20 mg by mouth 2 (two) times daily.         . GUAIFENESIN ER  600 MG PO TB12   Oral   Take 1,200 mg by mouth 2 (two) times daily as needed. For congestion         . HYDROCHLOROTHIAZIDE 25 MG PO TABS   Oral   Take 25 mg by mouth daily.         . IBUPROFEN 200 MG PO TABS   Oral   Take 400-600 mg by mouth every 6 (six) hours as needed. For pain         . LEVOTHYROXINE SODIUM 125 MCG PO TABS   Oral   Take 125 mcg by mouth daily.           BP 157/89  Pulse 87  Temp 98.5 F (36.9 C) (Oral)  Resp 16  SpO2 99%  Vital signs normal    Physical Exam  Nursing note and vitals reviewed. Constitutional: She is oriented to person, place, and time. She appears well-developed and well-nourished.  Non-toxic appearance. She does not appear ill. No distress.       obese  HENT:  Head: Normocephalic and atraumatic.  Right Ear: External ear normal.  Left Ear: External ear normal.  Nose: Nose normal. No mucosal edema or rhinorrhea.  Mouth/Throat: Oropharynx is clear and moist and mucous membranes are normal.  No dental abscesses or uvula swelling.  Eyes: Conjunctivae normal and EOM are normal. Pupils are equal, round, and reactive to light.  Neck: Normal range of motion and full passive range of motion without pain. Neck supple.  Cardiovascular: Normal rate, regular rhythm and normal heart sounds.  Exam reveals no gallop and no friction rub.   No murmur heard. Pulmonary/Chest: Effort normal and breath sounds normal. No respiratory distress. She has no wheezes. She has no rhonchi. She has no rales. She exhibits no tenderness and no crepitus.  Abdominal: Normal appearance.  Musculoskeletal: She exhibits tenderness. She exhibits no edema.       Lower lumbar and sacral tenderness.  Non-tender over cervical and thoracic and upper lumbar spine.  Non-tender over SIJ bilaterally.  On ROM at the waist no pain on lateral movement, forward flexion reproduces pain. Patellar reflexes +2 and = bilaterally. Neg SLR bilaterally  Neurological: She is alert and  oriented to person, place, and time. She has normal strength. No cranial nerve deficit.  Skin: Skin is warm, dry and intact. No rash noted. No erythema. No pallor.  Psychiatric: She has a normal mood and affect. Her speech is normal and behavior is normal. Her mood appears not anxious.    ED Course  Procedures (including critical care time)   Medications  ketorolac (TORADOL) injection 60 mg (60 mg Intramuscular Given 12/01/12 1651)  diazepam (VALIUM) tablet 10 mg (10 mg Oral Given 12/01/12 1651)    DIAGNOSTIC STUDIES: Oxygen Saturation is 99% on room air, normal by my interpretation.    COORDINATION OF CARE: 4:39 PM- Patient informed of clinical course, understands medical decision-making process, and agrees with plan.  Ordered IM Toradol, IM Valium, and urinalysis.  Advised follow-up with Dr. Leveda Anna if symptoms worsen.  Results for orders placed during the hospital encounter of 12/01/12  URINALYSIS, ROUTINE W REFLEX MICROSCOPIC      Component Value Range   Color, Urine YELLOW  YELLOW   APPearance CLOUDY (*) CLEAR   Specific Gravity, Urine 1.026  1.005 - 1.030   pH 6.0  5.0 - 8.0   Glucose, UA NEGATIVE  NEGATIVE mg/dL   Hgb urine dipstick NEGATIVE  NEGATIVE   Bilirubin Urine NEGATIVE  NEGATIVE   Ketones, ur NEGATIVE  NEGATIVE mg/dL   Protein, ur NEGATIVE  NEGATIVE mg/dL   Urobilinogen, UA 1.0  0.0 - 1.0 mg/dL   Nitrite NEGATIVE  NEGATIVE   Leukocytes, UA MODERATE (*) NEGATIVE  URINE MICROSCOPIC-ADD ON      Component Value Range   Squamous Epithelial / LPF MANY (*) RARE   WBC, UA 3-6  <3 WBC/hpf   RBC / HPF 0-2  <3 RBC/hpf   Bacteria, UA FEW (*) RARE   Urine-Other TRICHOMONAS PRESENT     Laboratory interpretation all normal except contaminated UA     1. Low back pain     New Prescriptions   METHOCARBAMOL (ROBAXIN) 500 MG TABLET    Take 2 tablets (1,000 mg total) by mouth 4 (four) times daily as needed.   PREDNISONE (DELTASONE) 20 MG TABLET    Take 3 po QD x 3d ,  then 2 po QD x 3d then 1 po QD x 3d   TRAMADOL-ACETAMINOPHEN (ULTRACET) 37.5-325 MG PER TABLET    2 tabs po QID prn pain    Plan discharge  Devoria Albe, MD, FACEP   MDM    I personally performed the services described in this documentation, which was scribed in my presence. The  recorded information has been reviewed and considered.  Devoria Albe, MD, FACEP        Ward Givens, MD 12/01/12 [phone]

## 2012-12-03 LAB — URINE CULTURE: Colony Count: 45000

## 2013-04-17 ENCOUNTER — Ambulatory Visit: Payer: Medicaid Other | Admitting: Family Medicine

## 2013-04-24 ENCOUNTER — Ambulatory Visit: Payer: Medicaid Other | Admitting: Family Medicine

## 2013-05-13 ENCOUNTER — Ambulatory Visit (INDEPENDENT_AMBULATORY_CARE_PROVIDER_SITE_OTHER): Payer: BC Managed Care – PPO | Admitting: Surgery

## 2013-05-13 ENCOUNTER — Encounter (INDEPENDENT_AMBULATORY_CARE_PROVIDER_SITE_OTHER): Payer: Self-pay | Admitting: Surgery

## 2013-05-13 NOTE — Patient Instructions (Addendum)

## 2013-05-13 NOTE — Progress Notes (Signed)
Chief Complaint:  Morbid obesity BMI 52  History of Present Illness:  Paula Austin is an 47 y.o. female who has been to 2 of our seminars and is interested in lap gastric bypass.  I discussed this procedure with her in detail including complications not limited to anastomotic leaks. She is very knowledgeable patient and his study this. She has questions. She has had multiple attempts at weight loss and has been in the gym and his work out a lot. She noted a lot of weight loss after she had a thyroidectomy back in early 2006. Her weight is up to 343. I questioned her about Deltasone listed under medications but she only was given that for back spasms several months ago and didn't take it. I explained that with the gastric bypass she cannot take NSAIDs or steroids.    Past Medical History  Diagnosis Date  . Hypertension   . Thyroid disease     History reviewed. No pertinent past surgical history.  Current Outpatient Prescriptions  Medication Sig Dispense Refill  . cholecalciferol (VITAMIN D) 1000 UNITS tablet Take 1,000 Units by mouth daily. OTC med       . enalapril (VASOTEC) 20 MG tablet Take 20 mg by mouth 2 (two) times daily.      . hydrochlorothiazide (HYDRODIURIL) 25 MG tablet Take 25 mg by mouth daily.      Marland Kitchen ibuprofen (ADVIL,MOTRIN) 200 MG tablet Take 400-600 mg by mouth every 6 (six) hours as needed. For pain      . levothyroxine (SYNTHROID, LEVOTHROID) 125 MCG tablet Take 125 mcg by mouth daily.       No current facility-administered medications for this visit.   Review of patient's allergies indicates no known allergies. Family History  Problem Relation Age of Onset  . Cancer Maternal Grandfather     lung   Social History:   reports that she has never smoked. She has never used smokeless tobacco. She reports that she does not drink alcohol or use illicit drugs.   REVIEW OF SYSTEMS - PERTINENT POSITIVES ONLY: No DVT  Physical Exam:   Blood pressure 138/84, pulse 76,  temperature 97.4 F (36.3 C), temperature source Temporal, resp. rate 14, height 5\' 8"  (1.727 m), weight 343 lb 3.2 oz (155.674 kg). Body mass index is 52.2 kg/(m^2).  Gen:  WDWN AAF NAD  Neurological: Alert and oriented to person, place, and time. Motor and sensory function is grossly intact  Head: Normocephalic and atraumatic.  Eyes: Conjunctivae are normal. Pupils are equal, round, and reactive to light. No scleral icterus.  Neck: Normal range of motion. Neck supple. No tracheal deviation or thyromegaly present.  Cardiovascular:  SR without murmurs or gallops.  No carotid bruits Respiratory: Effort normal.  No respiratory distress. No chest wall tenderness. Breath sounds normal.  No wheezes, rales or rhonchi.  Abdomen:  Prior tubal ligation GU: Musculoskeletal: Normal range of motion. Extremities are nontender. No cyanosis, edema or clubbing noted Lymphadenopathy: No cervical, preauricular, postauricular or axillary adenopathy is present Skin: Skin is warm and dry. No rash noted. No diaphoresis. No erythema. No pallor. Pscyh: Normal mood and affect. Behavior is normal. Judgment and thought content normal.   LABORATORY RESULTS: No results found for this or any previous visit (from the past 48 hour(s)).  RADIOLOGY RESULTS: No results found.  Problem List: Patient Active Problem List   Diagnosis Date Noted  . Breast cancer screening 11/23/2011  . Amenorrhea 11/23/2011  . 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  Hypertension 05/02/2011  . Sleep apnea 05/02/2011  . Obesity 05/02/2011    Assessment & Plan: Morbid obesity and desire lap gastric bypass.      Matt B. Daphine Deutscher, MD, [facility] [phone] beeper [phone]  05/13/2013 4:20 PM

## 2013-05-22 ENCOUNTER — Encounter: Payer: Self-pay | Admitting: Family Medicine

## 2013-05-22 ENCOUNTER — Ambulatory Visit (INDEPENDENT_AMBULATORY_CARE_PROVIDER_SITE_OTHER): Payer: BC Managed Care – PPO | Admitting: Family Medicine

## 2013-05-22 VITALS — BP 148/102 | HR 91 | Temp 98.1°F | Ht 68.0 in | Wt 340.3 lb

## 2013-05-22 DIAGNOSIS — E890 Postprocedural hypothyroidism: Secondary | ICD-10-CM

## 2013-05-22 DIAGNOSIS — I10 Essential (primary) hypertension: Secondary | ICD-10-CM

## 2013-05-22 DIAGNOSIS — Z0000 Encounter for general adult medical examination without abnormal findings: Secondary | ICD-10-CM | POA: Insufficient documentation

## 2013-05-22 DIAGNOSIS — N951 Menopausal and female climacteric states: Secondary | ICD-10-CM | POA: Insufficient documentation

## 2013-05-22 DIAGNOSIS — E669 Obesity, unspecified: Secondary | ICD-10-CM

## 2013-05-22 DIAGNOSIS — L8990 Pressure ulcer of unspecified site, unspecified stage: Secondary | ICD-10-CM | POA: Insufficient documentation

## 2013-05-22 LAB — LIPID PANEL
Cholesterol: 215 mg/dL — ABNORMAL HIGH (ref 0–200)
HDL: 56 mg/dL (ref >39)
LDL Cholesterol: 145 mg/dL — ABNORMAL HIGH (ref 0–99)
Total CHOL/HDL Ratio: 3.8 Ratio
Triglycerides: 68 mg/dL (ref <150)
VLDL: 14 mg/dL (ref 0–40)

## 2013-05-22 LAB — COMPLETE METABOLIC PANEL WITH GFR
ALT: 28 U/L (ref 0–35)
AST: 19 U/L (ref 0–37)
Alkaline Phosphatase: 76 U/L (ref 39–117)
BUN: 10 mg/dL (ref 6–23)
Chloride: 101 mEq/L (ref 96–112)
Creat: 0.75 mg/dL (ref 0.50–1.10)
Potassium: 4 mEq/L (ref 3.5–5.3)

## 2013-05-22 LAB — CBC
HCT: 38.2 % (ref 36.0–46.0)
MCHC: 34 g/dL (ref 30.0–36.0)
MCV: 88.2 fL (ref 78.0–100.0)
Platelets: 257 10*3/uL (ref 150–400)
RDW: 14.2 % (ref 11.5–15.5)
WBC: 7.4 10*3/uL (ref 4.0–10.5)

## 2013-05-22 LAB — TSH: TSH: 3.182 u[IU]/mL (ref 0.350–4.500)

## 2013-05-22 MED ORDER — ENALAPRIL MALEATE 20 MG PO TABS
20.0000 mg | ORAL_TABLET | Freq: Two times a day (BID) | ORAL | Status: DC
Start: 2013-05-22 — End: 2013-08-31

## 2013-05-22 MED ORDER — LEVOTHYROXINE SODIUM 125 MCG PO TABS: 125.0000 ug | ORAL_TABLET | Freq: Every day | ORAL | Status: DC

## 2013-05-22 MED ORDER — HYDROCHLOROTHIAZIDE 25 MG PO TABS: 25.0000 mg | ORAL_TABLET | Freq: Every day | ORAL | Status: DC

## 2013-05-22 NOTE — Assessment & Plan Note (Signed)
Needs TSH.

## 2013-05-22 NOTE — Patient Instructions (Signed)
I am glad you signed up for MyChart. I am glad you are planning bariatric surgery.  The cows are still in the barn. Your blood pressure is up.  Get it checked several times over the next month.  We will then decide whether to increase your meds. See me in one month.   Make sure you have them send mammogram results to me.

## 2013-05-22 NOTE — Assessment & Plan Note (Addendum)
Begin medically supervised weight loss.  Will see nutritionist through bariatric surg program.

## 2013-05-22 NOTE — Assessment & Plan Note (Signed)
Poor control today.  Unclear whether due to missed doses or I need to increase meds.

## 2013-05-22 NOTE — Progress Notes (Signed)
  Subjective:    Patient ID: Paula Austin, female    DOB: 1966/10/29, 47 y.o.   MRN: [id number]  HPI Plans Bariatric surg.  Needs letter for medical necessity. Has appointment for mammogram. Up to date on screening/immunizations. BP up but has missed some doses.  Needs blood work - over 1 year since last check.  No complaints except for inability to lose wt.  Exercising regularly.    Review of Systems     Objective:   Physical ExamBP noted HEENT nl Neck supple without thyromegally Lungs clear Cardiac RRR without m or g abd benign Ext 1+ edema        Assessment & Plan:

## 2013-05-22 NOTE — Assessment & Plan Note (Signed)
Having irregular menses.

## 2013-05-22 NOTE — Assessment & Plan Note (Signed)
Blood work and begin medically supervised weight loss

## 2013-05-25 ENCOUNTER — Encounter: Payer: Self-pay | Admitting: Family Medicine

## 2013-05-26 ENCOUNTER — Ambulatory Visit (HOSPITAL_COMMUNITY)
Admission: RE | Admit: 2013-05-26 | Discharge: 2013-05-26 | Disposition: A | Discharge status: Home / Self Care | Payer: BC Managed Care – PPO | Source: Ambulatory Visit | Attending: Surgery | Admitting: Surgery

## 2013-05-26 ENCOUNTER — Encounter (HOSPITAL_COMMUNITY)
Admission: RE | Disposition: A | Discharge status: Home / Self Care | Payer: Self-pay | Source: Ambulatory Visit | Attending: Surgery

## 2013-05-26 HISTORY — PX: BREATH TEK H PYLORI: SHX5422

## 2013-05-26 SURGERY — BREATH TEST, FOR HELICOBACTER PYLORI

## 2013-05-27 ENCOUNTER — Encounter (HOSPITAL_COMMUNITY): Payer: Self-pay | Admitting: Surgery

## 2013-06-04 LAB — CBC WITH DIFFERENTIAL/PLATELET
Basophils Absolute: 0 10*3/uL (ref 0.0–0.1)
Eosinophils Relative: 2 % (ref 0–5)
Lymphocytes Relative: 36 % (ref 12–46)
Lymphs Abs: 2.9 10*3/uL (ref 0.7–4.0)
MCV: 90.3 fL (ref 78.0–100.0)
Neutrophils Relative %: 55 % (ref 43–77)
Platelets: 246 10*3/uL (ref 150–400)
RBC: 4.11 MIL/uL (ref 3.87–5.11)
RDW: 13.7 % (ref 11.5–15.5)
WBC: 8 10*3/uL (ref 4.0–10.5)

## 2013-06-04 LAB — T4: T4, Total: 7.1 ug/dL (ref 5.0–12.5)

## 2013-06-11 ENCOUNTER — Other Ambulatory Visit: Payer: Self-pay | Admitting: Family Medicine

## 2013-06-11 DIAGNOSIS — Z1231 Encounter for screening mammogram for malignant neoplasm of breast: Secondary | ICD-10-CM

## 2013-06-12 ENCOUNTER — Ambulatory Visit (HOSPITAL_COMMUNITY)
Admission: RE | Admit: 2013-06-12 | Discharge: 2013-06-12 | Disposition: A | Discharge status: Home / Self Care | Payer: BC Managed Care – PPO | Source: Ambulatory Visit | Attending: Surgery | Admitting: Surgery

## 2013-06-12 ENCOUNTER — Other Ambulatory Visit: Payer: Self-pay

## 2013-06-12 DIAGNOSIS — G4730 Sleep apnea, unspecified: Secondary | ICD-10-CM | POA: Insufficient documentation

## 2013-06-12 DIAGNOSIS — M4180 Other forms of scoliosis, site unspecified: Secondary | ICD-10-CM | POA: Insufficient documentation

## 2013-06-12 DIAGNOSIS — E079 Disorder of thyroid, unspecified: Secondary | ICD-10-CM | POA: Insufficient documentation

## 2013-06-12 DIAGNOSIS — Z6841 Body Mass Index (BMI) 40.0 and over, adult: Secondary | ICD-10-CM | POA: Insufficient documentation

## 2013-06-12 DIAGNOSIS — K828 Other specified diseases of gallbladder: Secondary | ICD-10-CM | POA: Insufficient documentation

## 2013-06-12 DIAGNOSIS — K7689 Other specified diseases of liver: Secondary | ICD-10-CM | POA: Insufficient documentation

## 2013-06-12 DIAGNOSIS — I10 Essential (primary) hypertension: Secondary | ICD-10-CM | POA: Insufficient documentation

## 2013-06-17 ENCOUNTER — Encounter: Payer: BC Managed Care – PPO | Attending: Surgery | Admitting: *Deleted

## 2013-06-17 ENCOUNTER — Encounter: Payer: Self-pay | Admitting: *Deleted

## 2013-06-17 VITALS — Ht 68.0 in | Wt 350.9 lb

## 2013-06-17 DIAGNOSIS — E669 Obesity, unspecified: Secondary | ICD-10-CM | POA: Insufficient documentation

## 2013-06-17 DIAGNOSIS — Z713 Dietary counseling and surveillance: Secondary | ICD-10-CM | POA: Insufficient documentation

## 2013-06-17 NOTE — Patient Instructions (Addendum)
   Follow Pre-Op Nutrition Goals to prepare for Gastric Bypass Surgery.   Call the [facility] at [phone] once you have been given your surgery date to enrolled in the Pre-Op Nutrition Class. You will need to attend this nutrition class 3-4 weeks prior to your surgery. 

## 2013-06-17 NOTE — Progress Notes (Signed)
  Pre-Op Assessment Visit:  Pre-Operative RYGB Surgery  Medical Nutrition Therapy:  Appt start time: 1530   End time:  1630.  Patient was seen on 06/17/2013 for Pre-Operative RYGB Nutrition Assessment. Assessment and letter of approval faxed to [facility] Program coordinator on 06/17/2013.  Approval letter sent to [employer] [facility] center and will be available in the chart under the media tab.  Handouts given during visit include:  Pre-Op Goals   Bariatric Surgery Protein Shakes  Patient to call for Pre-Op and Post-Op Nutrition Education at the [facility] when surgery is scheduled.

## 2013-06-18 ENCOUNTER — Ambulatory Visit (HOSPITAL_COMMUNITY)
Admission: RE | Admit: 2013-06-18 | Discharge: 2013-06-18 | Disposition: A | Discharge status: Home / Self Care | Payer: BC Managed Care – PPO | Source: Ambulatory Visit | Attending: Family Medicine | Admitting: Family Medicine

## 2013-06-18 DIAGNOSIS — Z1231 Encounter for screening mammogram for malignant neoplasm of breast: Secondary | ICD-10-CM | POA: Insufficient documentation

## 2013-06-29 ENCOUNTER — Other Ambulatory Visit: Payer: Self-pay | Admitting: *Deleted

## 2013-06-29 DIAGNOSIS — E890 Postprocedural hypothyroidism: Secondary | ICD-10-CM

## 2013-06-29 MED ORDER — LEVOTHYROXINE SODIUM 125 MCG PO TABS: 125.0000 ug | ORAL_TABLET | Freq: Every day | ORAL | Status: DC

## 2013-06-29 NOTE — Telephone Encounter (Signed)
Yes, OK to switch

## 2013-06-29 NOTE — Telephone Encounter (Signed)
[facility] is changing manufacturers of levothyroxine from [employer] to [employer] - would like to know if this is ok for this pt? Wyatt Haste, RN-BSN

## 2013-07-15 ENCOUNTER — Other Ambulatory Visit: Payer: Self-pay | Admitting: *Deleted

## 2013-07-15 DIAGNOSIS — E890 Postprocedural hypothyroidism: Secondary | ICD-10-CM

## 2013-07-15 DIAGNOSIS — I10 Essential (primary) hypertension: Secondary | ICD-10-CM

## 2013-07-15 MED ORDER — LEVOTHYROXINE SODIUM 125 MCG PO TABS: 125.0000 ug | ORAL_TABLET | Freq: Every day | ORAL | Status: DC

## 2013-07-15 MED ORDER — HYDROCHLOROTHIAZIDE 25 MG PO TABS: 25.0000 mg | ORAL_TABLET | Freq: Every day | ORAL | Status: DC

## 2013-07-17 ENCOUNTER — Ambulatory Visit: Payer: BC Managed Care – PPO | Admitting: Family Medicine

## 2013-08-17 DIAGNOSIS — R112 Nausea with vomiting, unspecified: Secondary | ICD-10-CM

## 2013-08-17 DIAGNOSIS — Z9889 Other specified postprocedural states: Secondary | ICD-10-CM

## 2013-08-17 HISTORY — DX: Other specified postprocedural states: Z98.890

## 2013-08-17 HISTORY — DX: Other specified postprocedural states: R11.2

## 2013-08-20 ENCOUNTER — Encounter: Payer: BC Managed Care – PPO | Attending: Surgery | Admitting: *Deleted

## 2013-08-20 VITALS — Ht 68.0 in | Wt 352.5 lb

## 2013-08-20 DIAGNOSIS — Z713 Dietary counseling and surveillance: Secondary | ICD-10-CM | POA: Insufficient documentation

## 2013-08-20 DIAGNOSIS — E669 Obesity, unspecified: Secondary | ICD-10-CM

## 2013-08-23 NOTE — Progress Notes (Signed)
  Pre-Operative Nutrition Class:  Appt start time: 1730   End time:  1830.  Patient was seen on 08/20/13 for Pre-Operative Bariatric Surgery Education at [facility].   Surgery date: 09/07/13 Surgery type: RYGB Start weight at [facility]: 350.9 lbs (06/17/13) Weight today: 352.5 lbs   TANITA  BODY COMP RESULTS  08/20/13   BMI (kg/m^2) 53.6   Fat Mass (lbs) 180.5   Fat Free Mass (lbs) 172.0   Total Body Water (lbs) 126.0   Samples given per MNT protocol.  Patient educated on appropriate usage. Bariatric Advantage Multivitamin Lot # [id number]; Exp: 12/15  Bariatric Advantage Sublingual B12 Lot # [id number]; Exp: 10/15  Celebrate Vitamins Multivitamin Lot # [id number]; Exp: 01/15  Celebrate Vitamins Calcium Citrate Lot # [id number]; Exp: 01/16  BariActiv Multivitamin Lot # [id number]; Exp: 05/16  BariActiv Calcium Citrate Lot # [id number]; Exp: 05/16  BariActiv Iron + Vit C Lot # [id number]; Exp: 05/16  Unjury Protein Powder Lot # [id number]; Exp: 09/15  The following the learning objectives were met by the patient during this course:  Identify Pre-Op Dietary Goals and will begin 2 weeks pre-operatively  Identify appropriate sources of fluids and proteins   State protein recommendations and appropriate sources pre and post-operatively  Identify Post-Operative Dietary Goals and will follow for 2 weeks post-operatively  Identify appropriate multivitamin and calcium sources  Describe the need for physical activity post-operatively and will follow MD recommendations  State when to call healthcare provider regarding medication questions or post-operative complications  Handouts given during class include:  Pre-Op Bariatric Surgery Diet Handout  Protein Shake Handout  Post-Op Bariatric Surgery Nutrition Handout  [facility]ELT Program Information Flyer  Support Group Information Flyer  [facility] Bariatric Supplements Price List  Follow-Up Plan: Patient will  follow-up at [facility] 2 weeks post operatively for diet advancement per MD.

## 2013-08-23 NOTE — Patient Instructions (Signed)
Follow:  Pre-Op Diet per MD 2 weeks prior to surgery  Phase 2- Liquids (clear/full) 2 weeks after surgery  Vitamin/Mineral/Calcium guidelines for purchasing bariatric supplements  Exercise guidelines pre and post-op per MD  Follow-up at [facility] in 2 weeks post-op for diet advancement. Contact Tajai Suder at [email or web address] or [phone] as needed with questions/concerns.   

## 2013-08-31 ENCOUNTER — Encounter (HOSPITAL_COMMUNITY): Payer: Self-pay | Admitting: Pharmacy Technician

## 2013-08-31 NOTE — Progress Notes (Signed)
Dr. Daphine Deutscher,  Please enter preop orders in Epic for Paula Austin - she is coming to [facility] Thursday  9/18 for her preop labs / visit.  Thanks.

## 2013-09-02 ENCOUNTER — Other Ambulatory Visit (HOSPITAL_COMMUNITY): Payer: Self-pay | Admitting: Surgery

## 2013-09-02 NOTE — Patient Instructions (Addendum)
[address] Spruell  09/02/2013   Your procedure is scheduled on: 09-07-2013  MONDAY  Report to [facility] at 515 AM.  Call this number if you have problems the morning of surgery [phone] BRING CPAP MASK AND TUBING WITH YOU TO HOSPITAL  Remember:   Do not eat food or drink liquids :After Midnight. Sunday NIGHT     Take these medicines the morning of surgery with A SIP OF WATER:  Levothyroxine                                SEE [facility] PREPARING FOR SURGERY SHEET             You may not have any metal on your body including hair pins and piercings  Do not wear jewelry, make-up.  Do not wear lotions, powders, or perfumes. You may wear deodorant.   Men may shave face and neck.  Do not bring valuables to the hospital. [facility] IS NOT RESPONSIBLE FOR VALUEABLES.  Contacts, dentures or bridgework may not be worn into surgery.  Leave suitcase in the car. After surgery it may be brought to your room.  For patients admitted to the hospital, checkout time is 11:00 AM the day of discharge.   Patients discharged the day of surgery will not be allowed to drive home.  Name and phone number of your driver:family  Special Instructions: N/A   Please read over the following fact sheets that you were given:   Call Theodis Aguas  RN pre op nurse if needed [phone]    FAILURE TO FOLLOW THESE INSTRUCTIONS MAY RESULT IN THE CANCELLATION OF YOUR SURGERY.  PATIENT SIGNATURE___________________________________________  NURSE SIGNATURE_____________________________________________

## 2013-09-02 NOTE — Progress Notes (Signed)
Chest xray 2 view 06-12-13 epic ekg 06-12-13 epic

## 2013-09-03 ENCOUNTER — Encounter (HOSPITAL_COMMUNITY)
Admission: RE | Admit: 2013-09-03 | Discharge: 2013-09-03 | Disposition: A | Discharge status: Home / Self Care | Payer: BC Managed Care – PPO | Source: Ambulatory Visit | Attending: Surgery | Admitting: Surgery

## 2013-09-03 ENCOUNTER — Ambulatory Visit (INDEPENDENT_AMBULATORY_CARE_PROVIDER_SITE_OTHER): Payer: BC Managed Care – PPO | Admitting: Surgery

## 2013-09-03 ENCOUNTER — Encounter (HOSPITAL_COMMUNITY): Payer: Self-pay

## 2013-09-03 ENCOUNTER — Encounter (INDEPENDENT_AMBULATORY_CARE_PROVIDER_SITE_OTHER): Payer: Self-pay | Admitting: Surgery

## 2013-09-03 VITALS — BP 146/90 | HR 80 | Temp 98.1°F | Resp 14 | Ht 68.0 in | Wt 340.8 lb

## 2013-09-03 DIAGNOSIS — E669 Obesity, unspecified: Secondary | ICD-10-CM

## 2013-09-03 DIAGNOSIS — Z01812 Encounter for preprocedural laboratory examination: Secondary | ICD-10-CM | POA: Insufficient documentation

## 2013-09-03 DIAGNOSIS — Z01818 Encounter for other preprocedural examination: Secondary | ICD-10-CM | POA: Insufficient documentation

## 2013-09-03 HISTORY — DX: Gastro-esophageal reflux disease without esophagitis: K21.9

## 2013-09-03 HISTORY — DX: Personal history of urinary calculi: Z87.442

## 2013-09-03 LAB — CBC
Hemoglobin: 12.8 g/dL (ref 12.0–15.0)
MCH: 30.1 pg (ref 26.0–34.0)
MCV: 90.1 fL (ref 78.0–100.0)
Platelets: 265 10*3/uL (ref 150–400)
RBC: 4.25 MIL/uL (ref 3.87–5.11)

## 2013-09-03 LAB — BASIC METABOLIC PANEL
BUN: 14 mg/dL (ref 6–23)
CO2: 26 mEq/L (ref 19–32)
Calcium: 9.7 mg/dL (ref 8.4–10.5)
Creatinine, Ser: 0.66 mg/dL (ref 0.50–1.10)
Glucose, Bld: 108 mg/dL — ABNORMAL HIGH (ref 70–99)

## 2013-09-03 NOTE — Progress Notes (Signed)
Chief Complaint:  Morbid obesity BMI 52:  Wants to proceed with lap roux en Y gastric bypass  History of Present Illness:  Paula Austin is an 47 y.o. female who has been to 2 of our seminars and is interested in lap gastric bypass.  I discussed this procedure with her in detail including complications not limited to anastomotic leaks. She is very knowledgeable patient and his study this. She has questions. She has had multiple attempts at weight loss and has been in the gym and his work out a lot. She noted a lot of weight loss after she had a thyroidectomy back in early 2006. Her weight is 340.  BMI 52.  Ultrasound showed a polyp,  UGI negative.   I questioned her about Deltasone listed under medications but she only was given that for back spasms several months ago and didn't take it. I explained that with the gastric bypass she cannot take NSAIDs or steroids.    Questions answered and patient ready for roux Y gastric bypass.  I gave her a sample of ImBev to drink the evening before her surgery.    Past Medical History  Diagnosis Date  . Hypertension   . Thyroid disease   . Hyperlipidemia   . OSA on CPAP   . Goiter   . Morbid obesity   . History of kidney stones   . GERD (gastroesophageal reflux disease)     Past Surgical History  Procedure Laterality Date  . Breath tek h pylori N/A 05/26/2013    Procedure: BREATH TEK H PYLORI;  Surgeon: Valarie Merino, MD;  Location: [facility];  Service: General;  Laterality: N/A;  . Thyroidectomy  10/30/02  . Breast surgery  11/15/06    Reduction  . Tubal ligation  08/25/98    Current Outpatient Prescriptions  Medication Sig Dispense Refill  . cholecalciferol (VITAMIN D) 1000 UNITS tablet Take 1,000 Units by mouth daily. OTC med       . cyanocobalamin 1000 MCG tablet Place 100 mcg under the tongue daily.      . enalapril (VASOTEC) 20 MG tablet Take 20 mg by mouth 2 (two) times daily.      . hydrochlorothiazide (HYDRODIURIL) 25 MG tablet  Take 25 mg by mouth daily.      Marland Kitchen levothyroxine (SYNTHROID, LEVOTHROID) 125 MCG tablet Take 125 mcg by mouth daily before breakfast.      . phenylephrine (SUDAFED PE) 10 MG TABS tablet Take 10 mg by mouth every 4 (four) hours as needed.       No current facility-administered medications for this visit.   Oxycodone Family History  Problem Relation Age of Onset  . Cancer Maternal Grandfather     lung  . Diabetes Father   . Diabetes Paternal Grandmother    Social History:   reports that she has never smoked. She has never used smokeless tobacco. She reports that she does not drink alcohol or use illicit drugs.   REVIEW OF SYSTEMS - PERTINENT POSITIVES ONLY: No DVT  Physical Exam:   Blood pressure 146/90, pulse 80, temperature 98.1 F (36.7 C), temperature source Temporal, resp. rate 14, height 5\' 8"  (1.727 m), weight 340 lb 12.8 oz (154.586 kg), last menstrual period 07/03/2013. Body mass index is 51.83 kg/(m^2).  Gen:  WDWN AAF NAD  Neurological: Alert and oriented to person, place, and time. Motor and sensory function is grossly intact  Head: Normocephalic and atraumatic.  Eyes: Conjunctivae are normal. Pupils are equal, round, and  reactive to light. No scleral icterus.  Neck: Normal range of motion. Neck supple. No tracheal deviation or thyromegaly present.  Cardiovascular:  SR without murmurs or gallops.  No carotid bruits Respiratory: Effort normal.  No respiratory distress. No chest wall tenderness. Breath sounds normal.  No wheezes, rales or rhonchi.  Abdomen:  Prior tubal ligation GU: Musculoskeletal: Normal range of motion. Extremities are nontender. No cyanosis, edema or clubbing noted Lymphadenopathy: No cervical, preauricular, postauricular or axillary adenopathy is present Skin: Skin is warm and dry. No rash noted. No diaphoresis. No erythema. No pallor. Pscyh: Normal mood and affect. Behavior is normal. Judgment and thought content normal.   LABORATORY  RESULTS: Results for orders placed during the hospital encounter of 09/03/13 (from the past 48 hour(s))  CBC     Status: None   Collection Time    09/03/13  8:40 AM      Result Value Range   WBC 6.0  4.0 - 10.5 K/uL   RBC 4.25  3.87 - 5.11 MIL/uL   Hemoglobin 12.8  12.0 - 15.0 g/dL   HCT 09.8  11.9 - 14.7 %   MCV 90.1  78.0 - 100.0 fL   MCH 30.1  26.0 - 34.0 pg   MCHC 33.4  30.0 - 36.0 g/dL   RDW 82.9  56.2 - 13.0 %   Platelets 265  150 - 400 K/uL  HCG, SERUM, QUALITATIVE     Status: None   Collection Time    09/03/13  8:40 AM      Result Value Range   Preg, Serum NEGATIVE  NEGATIVE   Comment:            THE SENSITIVITY OF THIS     METHODOLOGY IS >10 mIU/mL.    RADIOLOGY RESULTS: No results found.  Problem List: Patient Active Problem List   Diagnosis Date Noted  . Routine general medical examination at a health care facility 05/22/2013  . Perimenopausal 05/22/2013  . 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 Hypertension 05/02/2011  . Sleep apnea 05/02/2011  . morbid obesity BMI 52 05/02/2011    Assessment & Plan: For Roux en Y gastric bypass on Monday    Matt B. Daphine Deutscher, MD, [facility] [phone] beeper [phone]  09/03/2013 10:18 AM

## 2013-09-03 NOTE — Patient Instructions (Signed)

## 2013-09-03 NOTE — Progress Notes (Signed)
Dr Daphine Deutscher- need pre op orders please  Thanks

## 2013-09-07 ENCOUNTER — Encounter (HOSPITAL_COMMUNITY): Payer: Self-pay | Admitting: *Deleted

## 2013-09-07 ENCOUNTER — Encounter (HOSPITAL_COMMUNITY): Payer: Self-pay | Admitting: Anesthesiology

## 2013-09-07 ENCOUNTER — Inpatient Hospital Stay (HOSPITAL_COMMUNITY): Payer: BC Managed Care – PPO | Admitting: Anesthesiology

## 2013-09-07 ENCOUNTER — Encounter (HOSPITAL_COMMUNITY)
Admission: RE | Disposition: A | Discharge status: Home / Self Care | Payer: Self-pay | Source: Ambulatory Visit | Attending: Surgery

## 2013-09-07 ENCOUNTER — Other Ambulatory Visit (INDEPENDENT_AMBULATORY_CARE_PROVIDER_SITE_OTHER): Payer: Self-pay | Admitting: Surgery

## 2013-09-07 ENCOUNTER — Inpatient Hospital Stay (HOSPITAL_COMMUNITY)
Admission: RE | Admit: 2013-09-07 | Discharge: 2013-09-09 | DRG: 288 | Disposition: A | Discharge status: Home / Self Care | Payer: BC Managed Care – PPO | Source: Ambulatory Visit | Attending: Surgery | Admitting: Surgery

## 2013-09-07 DIAGNOSIS — Z79899 Other long term (current) drug therapy: Secondary | ICD-10-CM

## 2013-09-07 DIAGNOSIS — Z6841 Body Mass Index (BMI) 40.0 and over, adult: Secondary | ICD-10-CM

## 2013-09-07 DIAGNOSIS — Z9884 Bariatric surgery status: Secondary | ICD-10-CM

## 2013-09-07 DIAGNOSIS — E785 Hyperlipidemia, unspecified: Secondary | ICD-10-CM | POA: Diagnosis present

## 2013-09-07 DIAGNOSIS — K219 Gastro-esophageal reflux disease without esophagitis: Secondary | ICD-10-CM | POA: Diagnosis present

## 2013-09-07 DIAGNOSIS — G4733 Obstructive sleep apnea (adult) (pediatric): Secondary | ICD-10-CM | POA: Diagnosis present

## 2013-09-07 DIAGNOSIS — E669 Obesity, unspecified: Secondary | ICD-10-CM

## 2013-09-07 DIAGNOSIS — I10 Essential (primary) hypertension: Secondary | ICD-10-CM

## 2013-09-07 DIAGNOSIS — Z01812 Encounter for preprocedural laboratory examination: Secondary | ICD-10-CM

## 2013-09-07 HISTORY — DX: Failed or difficult intubation, initial encounter: T88.4XXA

## 2013-09-07 HISTORY — PX: GASTRIC ROUX-EN-Y: SHX5262

## 2013-09-07 LAB — CREATININE, SERUM: Creatinine, Ser: 0.82 mg/dL (ref 0.50–1.10)

## 2013-09-07 LAB — CBC
MCH: 30.3 pg (ref 26.0–34.0)
MCHC: 33.4 g/dL (ref 30.0–36.0)
Platelets: 217 10*3/uL (ref 150–400)
RDW: 13.3 % (ref 11.5–15.5)

## 2013-09-07 SURGERY — LAPAROSCOPIC ROUX-EN-Y GASTRIC
Anesthesia: General | Wound class: Clean Contaminated

## 2013-09-07 MED ORDER — INFLUENZA VAC SPLIT QUAD 0.5 ML IM SUSP
0.5000 mL | INTRAMUSCULAR | Status: AC
  Administered 2013-09-09: 0.5 mL via INTRAMUSCULAR
  Filled 2013-09-07 (×2): qty 0.5

## 2013-09-07 MED ORDER — EPHEDRINE SULFATE 50 MG/ML IJ SOLN
INTRAMUSCULAR | Status: DC | PRN
  Administered 2013-09-07: 5 mg via INTRAVENOUS

## 2013-09-07 MED ORDER — CISATRACURIUM BESYLATE (PF) 10 MG/5ML IV SOLN
INTRAVENOUS | Status: DC | PRN
  Administered 2013-09-07: 4 mg via INTRAVENOUS
  Administered 2013-09-07: 10 mg via INTRAVENOUS
  Administered 2013-09-07: 4 mg via INTRAVENOUS
  Administered 2013-09-07: 10 mg via INTRAVENOUS
  Administered 2013-09-07 (×2): 2 mg via INTRAVENOUS

## 2013-09-07 MED ORDER — DEXTROSE 5 % IV SOLN
2.0000 g | INTRAVENOUS | Status: DC | PRN
  Administered 2013-09-07: 2 g via INTRAVENOUS

## 2013-09-07 MED ORDER — LACTATED RINGERS IV SOLN
INTRAVENOUS | Status: DC | PRN
  Administered 2013-09-07 (×3): via INTRAVENOUS

## 2013-09-07 MED ORDER — KETOROLAC TROMETHAMINE 30 MG/ML IJ SOLN: 15.0000 mg | Freq: Once | INTRAMUSCULAR | Status: DC | PRN

## 2013-09-07 MED ORDER — SUCCINYLCHOLINE CHLORIDE 20 MG/ML IJ SOLN
INTRAMUSCULAR | Status: DC | PRN
  Administered 2013-09-07: 160 mg via INTRAVENOUS
  Administered 2013-09-07: 80 mg via INTRAVENOUS

## 2013-09-07 MED ORDER — MORPHINE SULFATE 2 MG/ML IJ SOLN
2.0000 mg | INTRAMUSCULAR | Status: DC | PRN
  Administered 2013-09-07 – 2013-09-08 (×4): 2 mg via INTRAVENOUS
  Administered 2013-09-08 (×2): 4 mg via INTRAVENOUS
  Administered 2013-09-09: 2 mg via INTRAVENOUS
  Administered 2013-09-09 (×2): 4 mg via INTRAVENOUS
  Filled 2013-09-07 (×3): qty 2
  Filled 2013-09-07 (×2): qty 1
  Filled 2013-09-07: qty 2
  Filled 2013-09-07: qty 1
  Filled 2013-09-07: qty 2
  Filled 2013-09-07: qty 1

## 2013-09-07 MED ORDER — ONDANSETRON HCL 4 MG/2ML IJ SOLN
INTRAMUSCULAR | Status: DC | PRN
  Administered 2013-09-07 (×2): 2 mg via INTRAVENOUS

## 2013-09-07 MED ORDER — HYDROMORPHONE HCL PF 1 MG/ML IJ SOLN
INTRAMUSCULAR | Status: AC
  Filled 2013-09-07: qty 1

## 2013-09-07 MED ORDER — GLYCOPYRROLATE 0.2 MG/ML IJ SOLN
INTRAMUSCULAR | Status: DC | PRN
  Administered 2013-09-07: .2 mg via INTRAVENOUS
  Administered 2013-09-07: .1 mg via INTRAVENOUS

## 2013-09-07 MED ORDER — PNEUMOCOCCAL VAC POLYVALENT 25 MCG/0.5ML IJ INJ
0.5000 mL | INJECTION | INTRAMUSCULAR | Status: AC
  Filled 2013-09-07 (×2): qty 0.5

## 2013-09-07 MED ORDER — UNJURY VANILLA POWDER: 2.0000 [oz_av] | Freq: Four times a day (QID) | ORAL | Status: DC

## 2013-09-07 MED ORDER — UNJURY CHOCOLATE CLASSIC POWDER
2.0000 [oz_av] | Freq: Four times a day (QID) | ORAL | Status: DC
  Administered 2013-09-09: 2 [oz_av] via ORAL

## 2013-09-07 MED ORDER — NEOSTIGMINE METHYLSULFATE 1 MG/ML IJ SOLN
INTRAMUSCULAR | Status: DC | PRN
  Administered 2013-09-07: 3 mg via INTRAVENOUS

## 2013-09-07 MED ORDER — FENTANYL CITRATE 0.05 MG/ML IJ SOLN
INTRAMUSCULAR | Status: DC | PRN
  Administered 2013-09-07 (×3): 50 ug via INTRAVENOUS
  Administered 2013-09-07 (×3): 100 ug via INTRAVENOUS
  Administered 2013-09-07 (×2): 50 ug via INTRAVENOUS

## 2013-09-07 MED ORDER — KCL IN DEXTROSE-NACL 20-5-0.45 MEQ/L-%-% IV SOLN
INTRAVENOUS | Status: DC
  Administered 2013-09-07 – 2013-09-08 (×3): via INTRAVENOUS
  Administered 2013-09-09: 125 mL/h via INTRAVENOUS
  Administered 2013-09-09: 09:00:00 via INTRAVENOUS
  Filled 2013-09-07 (×8): qty 1000

## 2013-09-07 MED ORDER — UNJURY CHICKEN SOUP POWDER: 2.0000 [oz_av] | Freq: Four times a day (QID) | ORAL | Status: DC

## 2013-09-07 MED ORDER — LACTATED RINGERS IR SOLN
Status: DC | PRN
  Administered 2013-09-07: 1000 mL

## 2013-09-07 MED ORDER — ONDANSETRON HCL 4 MG/2ML IJ SOLN
4.0000 mg | INTRAMUSCULAR | Status: DC | PRN
  Administered 2013-09-07 – 2013-09-08 (×3): 4 mg via INTRAVENOUS
  Filled 2013-09-07 (×4): qty 2

## 2013-09-07 MED ORDER — ACETAMINOPHEN 160 MG/5ML PO SOLN: 650.0000 mg | ORAL | Status: DC | PRN

## 2013-09-07 MED ORDER — LIDOCAINE HCL (CARDIAC) 20 MG/ML IV SOLN
INTRAVENOUS | Status: DC | PRN
  Administered 2013-09-07: 100 mg via INTRAVENOUS

## 2013-09-07 MED ORDER — DEXTROSE 5 % IV SOLN
INTRAVENOUS | Status: AC
  Filled 2013-09-07 (×2): qty 1

## 2013-09-07 MED ORDER — HYDROMORPHONE HCL PF 1 MG/ML IJ SOLN
0.2500 mg | INTRAMUSCULAR | Status: DC | PRN
  Administered 2013-09-07 (×2): 0.5 mg via INTRAVENOUS

## 2013-09-07 MED ORDER — BUPIVACAINE LIPOSOME 1.3 % IJ SUSP
INTRAMUSCULAR | Status: DC | PRN
  Administered 2013-09-07: 20 mL

## 2013-09-07 MED ORDER — PROPOFOL 10 MG/ML IV BOLUS
INTRAVENOUS | Status: DC | PRN
  Administered 2013-09-07: 225 mg via INTRAVENOUS
  Administered 2013-09-07: 75 mg via INTRAVENOUS

## 2013-09-07 MED ORDER — KCL IN DEXTROSE-NACL 20-5-0.45 MEQ/L-%-% IV SOLN
INTRAVENOUS | Status: AC
  Filled 2013-09-07: qty 1000

## 2013-09-07 MED ORDER — DEXAMETHASONE SODIUM PHOSPHATE 10 MG/ML IJ SOLN
INTRAMUSCULAR | Status: DC | PRN
  Administered 2013-09-07: 10 mg via INTRAVENOUS

## 2013-09-07 MED ORDER — HEPARIN SODIUM (PORCINE) 5000 UNIT/ML IJ SOLN
5000.0000 [IU] | Freq: Three times a day (TID) | INTRAMUSCULAR | Status: DC
  Administered 2013-09-07 – 2013-09-09 (×6): 5000 [IU] via SUBCUTANEOUS
  Filled 2013-09-07 (×8): qty 1

## 2013-09-07 MED ORDER — BUPIVACAINE LIPOSOME 1.3 % IJ SUSP
20.0000 mL | Freq: Once | INTRAMUSCULAR | Status: DC
  Filled 2013-09-07: qty 20

## 2013-09-07 MED ORDER — ALBUTEROL SULFATE HFA 108 (90 BASE) MCG/ACT IN AERS
INHALATION_SPRAY | RESPIRATORY_TRACT | Status: DC | PRN
  Administered 2013-09-07 (×2): 5 via RESPIRATORY_TRACT

## 2013-09-07 MED ORDER — EVICEL 5 ML EX KIT
PACK | CUTANEOUS | Status: DC | PRN
  Administered 2013-09-07: 20 mL

## 2013-09-07 MED ORDER — 0.9 % SODIUM CHLORIDE (POUR BTL) OPTIME
TOPICAL | Status: DC | PRN
  Administered 2013-09-07: 1000 mL

## 2013-09-07 MED ORDER — TISSEEL VH 10 ML EX KIT
PACK | CUTANEOUS | Status: AC
  Filled 2013-09-07: qty 2

## 2013-09-07 MED ORDER — MIDAZOLAM HCL 5 MG/5ML IJ SOLN
INTRAMUSCULAR | Status: DC | PRN
  Administered 2013-09-07: 1 mg via INTRAVENOUS
  Administered 2013-09-07: 0.5 mg via INTRAVENOUS

## 2013-09-07 MED ORDER — PROMETHAZINE HCL 25 MG/ML IJ SOLN: 6.2500 mg | INTRAMUSCULAR | Status: DC | PRN

## 2013-09-07 SURGICAL SUPPLY — 72 items
APL SKNCLS STERI-STRIP NONHPOA (GAUZE/BANDAGES/DRESSINGS)
APL SRG 32X5 SNPLK LF DISP (MISCELLANEOUS) ×1
APPLICATOR COTTON TIP 6IN STRL (MISCELLANEOUS) ×2 IMPLANT
APPLIER CLIP ROT 10 11.4 M/L (STAPLE)
APR CLP MED LRG 11.4X10 (STAPLE)
BENZOIN TINCTURE PRP APPL 2/3 (GAUZE/BANDAGES/DRESSINGS) IMPLANT
BLADE SURG 15 STRL LF DISP TIS (BLADE) ×1 IMPLANT
BLADE SURG 15 STRL SS (BLADE) ×2
CABLE HIGH FREQUENCY MONO STRZ (ELECTRODE) IMPLANT
CANISTER SUCTION 2500CC (MISCELLANEOUS) ×2 IMPLANT
CLIP APPLIE ROT 10 11.4 M/L (STAPLE) ×1 IMPLANT
CLIP SUT LAPRA TY ABSORB (SUTURE) ×4 IMPLANT
CLOTH BEACON ORANGE TIMEOUT ST (SAFETY) ×2 IMPLANT
COVER SURGICAL LIGHT HANDLE (MISCELLANEOUS) ×1 IMPLANT
DEVICE SUTURE ENDOST 10MM (ENDOMECHANICALS) ×3 IMPLANT
DISSECTOR BLUNT TIP ENDO 5MM (MISCELLANEOUS) ×2 IMPLANT
DRAIN CHANNEL RND F F (WOUND CARE) ×1 IMPLANT
DRAIN PENROSE 18X1/4 LTX STRL (WOUND CARE) ×2 IMPLANT
DRAPE CAMERA CLOSED 9X96 (DRAPES) ×2 IMPLANT
EVACUATOR SILICONE 100CC (DRAIN) ×1 IMPLANT
GAUZE SPONGE 4X4 16PLY XRAY LF (GAUZE/BANDAGES/DRESSINGS) ×2 IMPLANT
GLOVE BIOGEL M 8.0 STRL (GLOVE) ×2 IMPLANT
GOWN STRL NON-REIN LRG LVL3 (GOWN DISPOSABLE) ×2 IMPLANT
GOWN STRL REIN XL XLG (GOWN DISPOSABLE) ×8 IMPLANT
HANDLE STAPLE EGIA 4 XL (STAPLE) ×2 IMPLANT
HOVERMATT SINGLE USE (MISCELLANEOUS) ×2 IMPLANT
KIT BASIN OR (CUSTOM PROCEDURE TRAY) ×2 IMPLANT
KIT GASTRIC LAVAGE 34FR ADT (SET/KITS/TRAYS/PACK) ×2 IMPLANT
MARKER SKIN DUAL TIP RULER LAB (MISCELLANEOUS) ×2 IMPLANT
NDL SPNL 22GX3.5 QUINCKE BK (NEEDLE) ×1 IMPLANT
NEEDLE SPNL 22GX3.5 QUINCKE BK (NEEDLE) ×2 IMPLANT
NS IRRIG 1000ML POUR BTL (IV SOLUTION) ×2 IMPLANT
PACK CARDIOVASCULAR III (CUSTOM PROCEDURE TRAY) ×2 IMPLANT
RELOAD EGIA 45 MED/THCK PURPLE (STAPLE) ×2 IMPLANT
RELOAD EGIA 45 TAN VASC (STAPLE) ×2 IMPLANT
RELOAD EGIA 60 MED/THCK PURPLE (STAPLE) ×2 IMPLANT
RELOAD EGIA 60 TAN VASC (STAPLE) ×6 IMPLANT
RELOAD ENDO STITCH 2.0 (ENDOMECHANICALS) ×22
RELOAD STAPLE 60 MED/THCK ART (STAPLE) ×2 IMPLANT
RELOAD SUT SNGL STCH ABSRB 2-0 (ENDOMECHANICALS) ×5 IMPLANT
RELOAD SUT SNGL STCH BLK 2-0 (ENDOMECHANICALS) ×4 IMPLANT
SCISSORS LAP 5X45 EPIX DISP (ENDOMECHANICALS) ×1 IMPLANT
SEALANT SURGICAL APPL DUAL CAN (MISCELLANEOUS) ×2 IMPLANT
SET IRRIG TUBING LAPAROSCOPIC (IRRIGATION / IRRIGATOR) ×2 IMPLANT
SHEARS CURVED HARMONIC AC 45CM (MISCELLANEOUS) ×2 IMPLANT
SLEEVE ADV FIXATION 12X100MM (TROCAR) ×3 IMPLANT
SLEEVE ADV FIXATION 5X100MM (TROCAR) ×2 IMPLANT
SOLUTION ANTI FOG 6CC (MISCELLANEOUS) ×2 IMPLANT
SPONGE DRAIN TRACH 4X4 STRL 2S (GAUZE/BANDAGES/DRESSINGS) ×1 IMPLANT
SPONGE GAUZE 4X4 12PLY (GAUZE/BANDAGES/DRESSINGS) ×1 IMPLANT
STAPLER VISISTAT 35W (STAPLE) ×2 IMPLANT
STRIP CLOSURE SKIN 1/2X4 (GAUZE/BANDAGES/DRESSINGS) IMPLANT
STRIP PERI DRY VERITAS 45 (STAPLE) ×1 IMPLANT
STRIP PERI DRY VERITAS 60 (STAPLE) ×4 IMPLANT
SUT ETHILON 2 0 PS N (SUTURE) ×1 IMPLANT
SUT RELOAD ENDO STITCH 2 48X1 (ENDOMECHANICALS) ×7
SUT RELOAD ENDO STITCH 2.0 (ENDOMECHANICALS) ×4
SUT VIC AB 2-0 SH 27 (SUTURE) ×4
SUT VIC AB 2-0 SH 27X BRD (SUTURE) ×1 IMPLANT
SUT VIC AB 4-0 SH 18 (SUTURE) ×2 IMPLANT
SUTURE RELOAD END STTCH 2 48X1 (ENDOMECHANICALS) ×7 IMPLANT
SUTURE RELOAD ENDO STITCH 2.0 (ENDOMECHANICALS) ×4 IMPLANT
SYR 20CC LL (SYRINGE) ×2 IMPLANT
SYR 30ML LL (SYRINGE) ×1 IMPLANT
SYR 50ML LL SCALE MARK (SYRINGE) ×2 IMPLANT
TRAY FOLEY CATH 14FRSI W/METER (CATHETERS) ×2 IMPLANT
TROCAR ADV FIXATION 12X100MM (TROCAR) ×4 IMPLANT
TROCAR BLADELESS OPT 5 100 (ENDOMECHANICALS) ×2 IMPLANT
TROCAR XCEL 12X100 BLDLESS (ENDOMECHANICALS) ×2 IMPLANT
TUBING CONNECTING 10 (TUBING) ×3 IMPLANT
TUBING ENDO SMARTCAP (MISCELLANEOUS) ×2 IMPLANT
TUBING FILTER THERMOFLATOR (ELECTROSURGICAL) ×2 IMPLANT

## 2013-09-07 NOTE — Preoperative (Signed)
Beta Blockers   Reason not to administer Beta Blockers:Not Applicable 

## 2013-09-07 NOTE — Transfer of Care (Signed)
Immediate Anesthesia Transfer of Care Note  Patient: Paula Austin  Procedure(s) Performed: Procedure(s): LAPAROSCOPIC ROUX-EN-Y GASTRIC upper endoscopy (N/A)  Patient Location: PACU  Anesthesia Type:General  Level of Consciousness: awake, alert , oriented and patient cooperative  Airway & Oxygen Therapy: Patient Spontanous Breathing and Patient connected to face mask oxygen  Post-op Assessment: Report given to PACU RN, Post -op Vital signs reviewed and stable and Patient moving all extremities  Post vital signs: Reviewed and stable  Complications: No apparent anesthesia complications

## 2013-09-07 NOTE — Anesthesia Preprocedure Evaluation (Addendum)
Anesthesia Evaluation  Patient identified by MRN, date of birth, ID band Patient awake    Reviewed: Allergy & Precautions, H&P , NPO status , Patient's Chart, lab work & pertinent test results  Airway Mallampati: IV TM Distance: <3 FB Neck ROM: Full    Dental no notable dental hx.    Pulmonary sleep apnea and Continuous Positive Airway Pressure Ventilation ,  breath sounds clear to auscultation  + decreased breath sounds      Cardiovascular hypertension, Pt. on medications Rhythm:Regular Rate:Normal     Neuro/Psych negative neurological ROS  negative psych ROS   GI/Hepatic negative GI ROS, Neg liver ROS,   Endo/Other  Hypothyroidism   Renal/GU negative Renal ROS  negative genitourinary   Musculoskeletal negative musculoskeletal ROS (+)   Abdominal   Peds negative pediatric ROS (+)  Hematology negative hematology ROS (+)   Anesthesia Other Findings   Reproductive/Obstetrics negative OB ROS                         Anesthesia Physical Anesthesia Plan  ASA: III  Anesthesia Plan: General   Post-op Pain Management:    Induction: Intravenous  Airway Management Planned: Oral ETT and Video Laryngoscope Planned  Additional Equipment:   Intra-op Plan:   Post-operative Plan: Extubation in OR  Informed Consent: I have reviewed the patients History and Physical, chart, labs and discussed the procedure including the risks, benefits and alternatives for the proposed anesthesia with the patient or authorized representative who has indicated his/her understanding and acceptance.   Dental advisory given  Plan Discussed with: CRNA and Surgeon  Anesthesia Plan Comments:        Anesthesia Quick Evaluation

## 2013-09-07 NOTE — Interval H&P Note (Signed)
History and Physical Interval Note:  09/07/2013 7:12 AM  Paula Austin  has presented today for surgery, with the diagnosis of morbid obesity   The various methods of treatment have been discussed with the patient and family. After consideration of risks, benefits and other options for treatment, the patient has consented to  Procedure(s): LAPAROSCOPIC ROUX-EN-Y GASTRIC (N/A) as a surgical intervention .  The patient's history has been reviewed, patient examined, no change in status, stable for surgery.  I have reviewed the patient's chart and labs.  Questions were answered to the patient's satisfaction.     [name]

## 2013-09-07 NOTE — Anesthesia Postprocedure Evaluation (Signed)
  Anesthesia Post-op Note  Patient: Paula Austin  Procedure(s) Performed: Procedure(s) (LRB): LAPAROSCOPIC ROUX-EN-Y GASTRIC upper endoscopy (N/A)  Patient Location: PACU  Anesthesia Type: General  Level of Consciousness: awake and alert   Airway and Oxygen Therapy: Patient Spontanous Breathing  Post-op Pain: mild  Post-op Assessment: Post-op Vital signs reviewed, Patient's Cardiovascular Status Stable, Respiratory Function Stable, Patent Airway and No signs of Nausea or vomiting  Last Vitals:  Filed Vitals:   09/07/13 1245  BP: 131/73  Pulse: 105  Temp:   Resp: 13    Post-op Vital Signs: stable   Complications: No apparent anesthesia complications

## 2013-09-07 NOTE — Anesthesia Procedure Notes (Signed)
Procedure Name: Intubation Date/Time: 09/07/2013 7:23 AM Performed by: Edison Pace Pre-anesthesia Checklist: Emergency Drugs available, Patient identified, Timeout performed, Suction available and Patient being monitored Patient Re-evaluated:Patient Re-evaluated prior to inductionOxygen Delivery Method: Circle system utilized Preoxygenation: Pre-oxygenation with 100% oxygen (glidescope, bougee at bedside elective glidescope per Dr Okey Dupre) Intubation Type: IV induction Ventilation: Mask ventilation throughout procedure, Two handed mask ventilation required and Nasal airway inserted- appropriate to patient size Laryngoscope Size: Mac and 4 Grade View: Grade III Tube type: Glide rite Tube size: 7.5 mm Number of attempts: 3 Airway Equipment and Method: Video-laryngoscopy and Bougie stylet (elective ) Placement Confirmation: ETT inserted through vocal cords under direct vision,  positive ETCO2 and breath sounds checked- equal and bilateral Secured at: 22 cm Tube secured with: Tape Dental Injury: Bloody posterior oropharynx  Difficulty Due To: Difficulty was anticipated, Difficult Airway- due to anterior larynx, Difficult Airway- due to limited oral opening, Difficult Airway- due to dentition, Difficult Airway- due to reduced neck mobility and Difficult Airway- due to large tongue Future Recommendations: Recommend- induction with short-acting agent, and alternative techniques readily available

## 2013-09-07 NOTE — Progress Notes (Signed)
Patient complains of "indigiestion" in her upper chest near breast area. Pt repositioned to more upright sitting , morphine 2mg  given and md notified. Will cont to monitor.

## 2013-09-07 NOTE — H&P (View-Only) (Signed)
Chief Complaint:  Morbid obesity BMI 52:  Wants to proceed with lap roux en Y gastric bypass  History of Present Illness:  Paula Austin is an 47 y.o. female who has been to 2 of our seminars and is interested in lap gastric bypass.  I discussed this procedure with her in detail including complications not limited to anastomotic leaks. She is very knowledgeable patient and his study this. She has questions. She has had multiple attempts at weight loss and has been in the gym and his work out a lot. She noted a lot of weight loss after she had a thyroidectomy back in early 2006. Her weight is 340.  BMI 52.  Ultrasound showed a polyp,  UGI negative.   I questioned her about Deltasone listed under medications but she only was given that for back spasms several months ago and didn't take it. I explained that with the gastric bypass she cannot take NSAIDs or steroids.    Questions answered and patient ready for roux Y gastric bypass.  I gave her a sample of ImBev to drink the evening before her surgery.    Past Medical History  Diagnosis Date  . Hypertension   . Thyroid disease   . Hyperlipidemia   . OSA on CPAP   . Goiter   . Morbid obesity   . History of kidney stones   . GERD (gastroesophageal reflux disease)     Past Surgical History  Procedure Laterality Date  . Breath tek h pylori N/A 05/26/2013    Procedure: BREATH TEK H PYLORI;  Surgeon: Anna-Marie Coller B Maksim Peregoy, MD;  Location: [facility];  Service: General;  Laterality: N/A;  . Thyroidectomy  10/30/02  . Breast surgery  11/15/06    Reduction  . Tubal ligation  08/25/98    Current Outpatient Prescriptions  Medication Sig Dispense Refill  . cholecalciferol (VITAMIN D) 1000 UNITS tablet Take 1,000 Units by mouth daily. OTC med       . cyanocobalamin 1000 MCG tablet Place 100 mcg under the tongue daily.      . enalapril (VASOTEC) 20 MG tablet Take 20 mg by mouth 2 (two) times daily.      . hydrochlorothiazide (HYDRODIURIL) 25 MG tablet  Take 25 mg by mouth daily.      . levothyroxine (SYNTHROID, LEVOTHROID) 125 MCG tablet Take 125 mcg by mouth daily before breakfast.      . phenylephrine (SUDAFED PE) 10 MG TABS tablet Take 10 mg by mouth every 4 (four) hours as needed.       No current facility-administered medications for this visit.   Oxycodone Family History  Problem Relation Age of Onset  . Cancer Maternal Grandfather     lung  . Diabetes Father   . Diabetes Paternal Grandmother    Social History:   reports that she has never smoked. She has never used smokeless tobacco. She reports that she does not drink alcohol or use illicit drugs.   REVIEW OF SYSTEMS - PERTINENT POSITIVES ONLY: No DVT  Physical Exam:   Blood pressure 146/90, pulse 80, temperature 98.1 F (36.7 C), temperature source Temporal, resp. rate 14, height 5' 8" (1.727 m), weight 340 lb 12.8 oz (154.586 kg), last menstrual period 07/03/2013. Body mass index is 51.83 kg/(m^2).  Gen:  WDWN AAF NAD  Neurological: Alert and oriented to person, place, and time. Motor and sensory function is grossly intact  Head: Normocephalic and atraumatic.  Eyes: Conjunctivae are normal. Pupils are equal, round, and   reactive to light. No scleral icterus.  Neck: Normal range of motion. Neck supple. No tracheal deviation or thyromegaly present.  Cardiovascular:  SR without murmurs or gallops.  No carotid bruits Respiratory: Effort normal.  No respiratory distress. No chest wall tenderness. Breath sounds normal.  No wheezes, rales or rhonchi.  Abdomen:  Prior tubal ligation GU: Musculoskeletal: Normal range of motion. Extremities are nontender. No cyanosis, edema or clubbing noted Lymphadenopathy: No cervical, preauricular, postauricular or axillary adenopathy is present Skin: Skin is warm and dry. No rash noted. No diaphoresis. No erythema. No pallor. Pscyh: Normal mood and affect. Behavior is normal. Judgment and thought content normal.   LABORATORY  RESULTS: Results for orders placed during the hospital encounter of 09/03/13 (from the past 48 hour(s))  CBC     Status: None   Collection Time    09/03/13  8:40 AM      Result Value Range   WBC 6.0  4.0 - 10.5 K/uL   RBC 4.25  3.87 - 5.11 MIL/uL   Hemoglobin 12.8  12.0 - 15.0 g/dL   HCT 38.3  36.0 - 46.0 %   MCV 90.1  78.0 - 100.0 fL   MCH 30.1  26.0 - 34.0 pg   MCHC 33.4  30.0 - 36.0 g/dL   RDW 13.3  11.5 - 15.5 %   Platelets 265  150 - 400 K/uL  HCG, SERUM, QUALITATIVE     Status: None   Collection Time    09/03/13  8:40 AM      Result Value Range   Preg, Serum NEGATIVE  NEGATIVE   Comment:            THE SENSITIVITY OF THIS     METHODOLOGY IS >10 mIU/mL.    RADIOLOGY RESULTS: No results found.  Problem List: Patient Active Problem List   Diagnosis Date Noted  . Routine general medical examination at a health care facility 05/22/2013  . Perimenopausal 05/22/2013  . 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 Hypertension 05/02/2011  . Sleep apnea 05/02/2011  . morbid obesity BMI 52 05/02/2011    Assessment & Plan: For Roux en Y gastric bypass on Monday    Matt B. Davien Malone, MD, FACS  [facility] [phone] beeper [phone]  09/03/2013 10:18 AM     

## 2013-09-07 NOTE — Brief Op Note (Signed)
09/07/2013  12:00 PM  PATIENT:  Paula Austin  47 y.o. female  PRE-OPERATIVE DIAGNOSIS:  morbid obesity   POST-OPERATIVE DIAGNOSIS:  morbid obesity  PROCEDURE:  Procedure(s): upper endoscopy (N/A)  SURGEON:  Surgeon(s) and Role:    Avri Paiva  PHYSICIAN ASSISTANT:   ASSISTANTS: martin   ANESTHESIA:   general  EBL:  Total I/O In: 2550 [I.V.:2550] Out: 225 [Urine:200; Blood:25]  BLOOD ADMINISTERED:none  DRAINS: none   LOCAL MEDICATIONS USED:  NONE  SPECIMEN:  No Specimen  DISPOSITION OF SPECIMEN:  N/A  COUNTS:  YES  TOURNIQUET:  * No tourniquets in log *  DICTATION: .Other Dictation: Dictation Number [id number]  PLAN OF CARE: Admit to inpatient   PATIENT DISPOSITION:  PACU - hemodynamically stable.   Delay start of Pharmacological VTE agent (>24hrs) due to surgical blood loss or risk of bleeding: no

## 2013-09-07 NOTE — Progress Notes (Signed)
Text paged Dr. Daphine Deutscher this am 09/07/13 requesting orders for surgery.

## 2013-09-07 NOTE — Op Note (Signed)
Surgeon: Pollyann Savoy. Daphine Deutscher, MD, FACS Asst:  Lodema Pilot, DO Anesthesia: General endotracheal Drains: None  Procedure: Laparoscopic Roux en Y gastric bypass with 40 cm BP limb and 100 cm Roux limb, antecolic, antegastric, candy cane to the left.  Closure of Peterson's defect. Upper endoscopy.   Description of Procedure:  The patient was taken to OR 1 at [facility] and given general anesthesia.  The abdomen was prepped with PCMX and draped sterilely.  A time out was performed.  Access to the abdomen was achieved with a 0 12 mm Optiview to the left upper quadrant. A total of 6 trochars were used including a 5 mm laterally which was subsequently converted to the drain site on the left.  The operation began by identifying the ligament of Treitz. I measured 40 cm downstream and divided the bowel with a 6 cm Covidian stapler.  I sutured a Penrose drain along the Roux limb end.  I measured a 1 meter (100 cm) Roux limb and then placed the distal bowels to the BP limb side by side and performed a stapled 4.5 mm  jejunojejunostomy. The common defect was closed from either end with 4-0 Vicryl using the Endo Stitch. The mesenteric defect was closed with a running 2-0 silk using the Endo Stitch. Tisseel was applied to the suture line.  The omentum was divided with the harmonic scalpel.  The Nathanson retractor was inserted in the left lateral segment of liver was retracted. The foregut dissection ensued.  5 cm was measured down from the EG junction and the stomach was divided along the lesser curvature a single firing of the purple cartridge Covidien 6 cm a second application going more cephalad was performed and once in this retrogastric space we applied multiple firings proximally using the green stapler purple loads and Peri-Strips.  The Roux limb was then brought up with the candycane pointed left and a back row of sutures of 2-0 Vicryl were placed. I opened along the right side of each structure and inserted the 4.5 cm  stapler to create the gastrojejunostomy. The common defect was closed from either end with 2-0 Vicryl and a second row was placed anterior to that the Ewald tube acting as a stent across the anastomosis. The Penrose drain was removed. Peterson's defect was closed with 2-0 silk. The Ewald was fixed and so I removed the anterior sutures and removed the tube.  I then placed the second layer again with no stent in place.    Endoscopy was performed by Dr. Biagio Quint and this revealed a 6-7 cm pouch with a patent anastomsis, no bleeding and no bubbles were seen with insufflation.    The incisions were injected with Exparel and were closed with 4-0 Vicryl and steristrips.    The patient was taken to the recovery room in satisfactory condition.  Matt B. Daphine Deutscher, MD, FACS

## 2013-09-08 ENCOUNTER — Encounter (HOSPITAL_COMMUNITY): Payer: Self-pay | Admitting: Surgery

## 2013-09-08 ENCOUNTER — Encounter (HOSPITAL_COMMUNITY): Payer: Self-pay | Admitting: *Deleted

## 2013-09-08 ENCOUNTER — Inpatient Hospital Stay (HOSPITAL_COMMUNITY): Payer: BC Managed Care – PPO

## 2013-09-08 DIAGNOSIS — Z9889 Other specified postprocedural states: Secondary | ICD-10-CM

## 2013-09-08 LAB — CBC WITH DIFFERENTIAL/PLATELET
Basophils Absolute: 0 10*3/uL (ref 0.0–0.1)
Eosinophils Absolute: 0 10*3/uL (ref 0.0–0.7)
Eosinophils Relative: 0 % (ref 0–5)
MCH: 30.3 pg (ref 26.0–34.0)
MCHC: 33.5 g/dL (ref 30.0–36.0)
MCV: 90.3 fL (ref 78.0–100.0)
Platelets: 217 10*3/uL (ref 150–400)
RDW: 13.3 % (ref 11.5–15.5)

## 2013-09-08 LAB — HEMOGLOBIN AND HEMATOCRIT, BLOOD
HCT: 34.8 % — ABNORMAL LOW (ref 36.0–46.0)
Hemoglobin: 11.7 g/dL — ABNORMAL LOW (ref 12.0–15.0)

## 2013-09-08 MED ORDER — IOHEXOL 300 MG/ML  SOLN
50.0000 mL | Freq: Once | INTRAMUSCULAR | Status: AC | PRN
  Administered 2013-09-08: 45 mL via ORAL

## 2013-09-08 MED ORDER — HYDRALAZINE HCL 20 MG/ML IJ SOLN
5.0000 mg | Freq: Once | INTRAMUSCULAR | Status: AC
  Administered 2013-09-08: 5 mg via INTRAVENOUS
  Filled 2013-09-08: qty 1

## 2013-09-08 NOTE — Op Note (Signed)
NAMEMARKIAH, Paula Austin             ACCOUNT NO.:  [id number]  MEDICAL RECORD NO.:  [id number]  LOCATION:  1538                         FACILITY:  [facility]  PHYSICIAN:  Lodema Pilot, MD       DATE OF BIRTH:  [birth year]  DATE OF PROCEDURE:  09/07/2013 DATE OF DISCHARGE:                              OPERATIVE REPORT   PROCEDURE:  Intraoperative upper endoscopy.  PREOPERATIVE DIAGNOSIS:  Morbid obesity.  POSTOPERATIVE DIAGNOSIS:  Morbid obesity.  SURGEON:  Lodema Pilot, MD  ASSISTANT:  Thornton Park. Daphine Deutscher, MD  ANESTHESIA:  General endotracheal tube anesthesia.  FLUIDS:  Please see anesthesia record for fluid administration.  ESTIMATED BLOOD LOSS:  None.  SPECIMENS:  None.  COMPLICATIONS:  None.  FINDINGS:  Normal-appearing anastomosis without any evidence of anastomotic leak.  No evidence of intraluminal bleeding.  No evidence of esophageal masses.  OPERATIVE DETAILS:  Ms. [name] was undergoing laparoscopic Roux-en-Y gastric bypass for morbid obesity by Dr. Daphine Deutscher and I was asked to perform intraoperative upper endoscopy for evaluation of her anastomosis and gastric pouch.  I passed a well-lubricated fiberoptic endoscope on the first attempt into the esophagus and advanced this under direct visualization through the esophagus and to the gastroesophageal junction, which was located at about 39 cm from the incisors.  There was no evidence of any esophageal mucosal changes or masses.  A scope was introduced into the gastric pouch and there was no evidence of intraluminal bleeding.  I insufflated the pouch that Dr. Daphine Deutscher had submerged under water to test the anastomosis for leakage and there was no evidence of air leakage.  The anastomosis was located 7 cm distal to the gastroesophageal junction and I advanced the scope through the anastomosis into the small bowel without difficulty.  The air was suctioned and the scope was removed.  There was no evidence of any complications.   For the rest of the details of the gastric bypass, please refer to the op note by Dr. Daphine Deutscher.          ______________________________ Lodema Pilot, MD     BL/MEDQ  D:  09/07/2013  T:  09/08/2013  Job:  [id number]

## 2013-09-08 NOTE — Progress Notes (Signed)
Patient prefers self placement of nocturnal CPAP. She does have her home equipment at bedside. She denies the need for any assistance of supplies at this time. She is aware that RT is present in the hospital at all times if she should need further assistance.

## 2013-09-08 NOTE — Care Management Note (Signed)
    Page 1 of 1   09/08/2013     11:16:12 AM   CARE MANAGEMENT NOTE 09/08/2013  Patient:  Paula Austin, Paula Austin   Account Number:  [id number]  Date Initiated:  09/08/2013  Documentation initiated by:  Lorenda Ishihara  Subjective/Objective Assessment:   47 yo female admitted s/p gastric bypass. PTA lived at home with spouse.     Action/Plan:   Home when stable   Anticipated DC Date:  09/11/2013   Anticipated DC Plan:  HOME/SELF CARE      DC Planning Services  CM consult      Choice offered to / List presented to:             Status of service:  Completed, signed off Medicare Important Message given?   (If response is "NO", the following Medicare IM given date fields will be blank) Date Medicare IM given:   Date Additional Medicare IM given:    Discharge Disposition:  HOME/SELF CARE  Per UR Regulation:  Reviewed for med. necessity/level of care/duration of stay  If discussed at Long Length of Stay Meetings, dates discussed:    Comments:

## 2013-09-08 NOTE — Progress Notes (Signed)
*  PRELIMINARY RESULTS* Vascular Ultrasound Lower extremity venous duplex has been completed.  Preliminary findings: no evidence of DVT in visualized veins.   Farrel Demark, RDMS, RVT  09/08/2013, 2:12 PM

## 2013-09-08 NOTE — Progress Notes (Signed)
Patient brought own CPAP machine, mask, and tubing. Equipment has been checked and looks fine . Will let [facility] know to come and check it in the AM.

## 2013-09-09 DIAGNOSIS — Z9884 Bariatric surgery status: Secondary | ICD-10-CM

## 2013-09-09 LAB — CBC WITH DIFFERENTIAL/PLATELET
Basophils Relative: 0 % (ref 0–1)
Eosinophils Absolute: 0 10*3/uL (ref 0.0–0.7)
Eosinophils Relative: 0 % (ref 0–5)
Hemoglobin: 11.3 g/dL — ABNORMAL LOW (ref 12.0–15.0)
MCH: 30.1 pg (ref 26.0–34.0)
MCHC: 33.3 g/dL (ref 30.0–36.0)
Monocytes Relative: 6 % (ref 3–12)
Neutro Abs: 9.3 10*3/uL — ABNORMAL HIGH (ref 1.7–7.7)
Neutrophils Relative %: 76 % (ref 43–77)
RDW: 13.3 % (ref 11.5–15.5)

## 2013-09-09 MED ORDER — HYDROCODONE-ACETAMINOPHEN 7.5-325 MG/15ML PO SOLN: 15.0000 mL | Freq: Four times a day (QID) | ORAL | Status: DC | PRN

## 2013-09-09 NOTE — Discharge Summary (Signed)
Physician Discharge Summary  Patient ID: Paula Austin MRN: [id number] DOB/AGE: 21-Oct-1966 47 y.o.  Admit date: 09/07/2013 Discharge date: 09/09/2013  Admission Diagnoses:  Morbid obesity  Discharge Diagnoses:  same  Active Problems:   Lap Roux Y Gastric Bypass Sept 2014   Surgery:  Lap Roux Y Gastric Bypass  Discharged Condition: improved  Hospital Course:   Had surgery.  UGI and DVT studies were negative.  Had some nausea PD 1 that resolved.  Ready for discharge on PD 2.    Consults: none  Significant Diagnostic Studies: UGI, doppler study of legs    Discharge Exam: Blood pressure 141/96, pulse 94, temperature 98.7 F (37.1 C), temperature source Oral, resp. rate 18, height 5\' 8"  (1.727 m), weight 340 lb 2 oz (154.28 kg), SpO2 100.00%. JP serosanguinous-removed.  No nausea or abdominal pain.    Disposition: 01-Home or Self Care  Discharge Orders   Future Appointments Provider Department Dept Phone   09/22/2013 3:30 PM [facility] Post-Op Class [facility] [phone]   09/25/2013 9:50 AM Valarie Merino, MD [facility] [place] [phone]   Future Orders Complete By Expires   Call MD for:  persistant nausea and vomiting  As directed    Call MD for:  severe uncontrolled pain  As directed    Call MD for:  temperature >100.4  As directed    Diet - low sodium heart healthy  As directed    Discharge wound care:  As directed    Comments:     May shower and shampoo as needed   Discontinue JP/Blake drain  As directed    Increase activity slowly  As directed        Medication List         cholecalciferol 1000 UNITS tablet  Commonly known as:  VITAMIN D  Take 1,000 Units by mouth daily. OTC med     cyanocobalamin 1000 MCG tablet  Place 100 mcg under the tongue daily.     enalapril 20 MG tablet  Commonly known as:  VASOTEC  Take 20 mg by mouth 2 (two) times daily.     hydrochlorothiazide 25 MG tablet  Commonly  known as:  HYDRODIURIL  Take 25 mg by mouth daily.     HYDROcodone-acetaminophen 7.5-325 mg/15 ml solution  Commonly known as:  HYCET  Take 15 mLs by mouth 4 (four) times daily as needed for pain.     levothyroxine 125 MCG tablet  Commonly known as:  SYNTHROID, LEVOTHROID  Take 125 mcg by mouth daily before breakfast.     phenylephrine 10 MG Tabs tablet  Commonly known as:  SUDAFED PE  Take 10 mg by mouth every 4 (four) hours as needed.           Follow-up Information   Follow up with Valarie Merino, MD.   Specialty:  General Surgery   Contact information:   [address] [place] [address] [phone]       Signed: Valarie Merino 09/09/2013, 3:30 PM

## 2013-09-09 NOTE — Progress Notes (Signed)
Patient alert and oriented, pain is controlled. Patient is tolerating fluids, plan to advance to protein shake today.  Reviewed Gastric Bypass discharge instructions with patient and patient is able to articulate understanding.  GASTRIC BYPASS / SLEEVE  Home Care Instructions  These instructions are to help you care for yourself when you go home.  Call: If you have any problems.   Call [phone] and ask for the surgeon on call   If you need immediate assistance come to the ER at [facility]. Tell the ER staff that you are a new post-op gastric bypass or gastric sleeve patient   Signs and symptoms to report:   Severe vomiting or nausea o If you cannot handle clear liquids for longer than 1 day, call your surgeon    Abdominal pain which does not get better after taking your pain medication   Fever greater than 100.4 F and chills   Heart rate over 100 beats a minute   Trouble breathing   Chest pain    Redness, swelling, drainage, or foul odor at incision (surgical) sites    If your incisions open or pull apart   Swelling or pain in calf (lower leg)   Diarrhea (Loose bowel movements that happen often), frequent watery, uncontrolled bowel movements   Constipation, (no bowel movements for 3 days) if this happens:  o Take Milk of Magnesia, 2 tablespoons by mouth, 3 times a day for 2 days if needed o Stop taking Milk of Magnesia once you have had a bowel movement o Call your doctor if constipation continues Or o Take Miralax  (instead of Milk of Magnesia) following the label instructions o Stop taking Miralax once you have had a bowel movement o Call your doctor if constipation continues   Anything you think is "abnormal for you"   Normal side effects after surgery:   Unable to sleep at night or unable to concentrate   Irritability   Being tearful (crying) or depressed These are common complaints, possibly related to your anesthesia, stress of surgery and change in lifestyle, that  usually go away a few weeks after surgery.  If these feelings continue, call your medical doctor.  Wound Care: You may have surgical glue, steri-strips, or staples over your incisions after surgery   Surgical glue:  Looks like a clear film over your incisions and will wear off a little at a time   Steri-strips : Adhesive strips of tape over your incisions. You may notice a yellowish color on the skin under the steri-strips. This is used to make the   steri-strips stick better. Do not pull the steri-strips off - let them fall off   Staples: Staples may be removed before you leave the hospital o If you go home with staples, call [facility] at for an appointment with your surgeon's nurse to have staples removed 10 days after surgery, [phone]   Showering: You may shower two (2) days after your surgery unless your surgeon tells you differently o Wash gently around incisions with warm soapy water, rinse well, and gently pat dry  o If you have a drain (tube from your incision), you may need someone to hold this while you shower  o No tub baths until staples are removed and incisions are healed     Medications:   Medications should be liquid or crushed if larger than the size of a dime   Extended release pills (medication that releases a little bit at a time   through the day) should not be crushed   Depending on the size and number of medications you take, you may need to space (take a few throughout the day)/change the time you take your medications so that you do not over-fill your pouch (smaller stomach)   Make sure you follow-up with your primary care physician to make medication changes needed during rapid weight loss and life-style changes   If you have diabetes, follow up with the doctor that orders your diabetes medication(s) within one week after surgery and check your blood sugar regularly.   Do not drive while taking narcotics (pain medications)   Do not take acetaminophen  (Tylenol) and Roxicet or Lortab Elixir at the same time since these pain medications contain acetaminophen  Diet:                    First 2 Weeks  You will see the nutritionist about two (2) weeks after your surgery. The nutritionist will increase the types of foods you can eat if you are handling liquids well:   If you have severe vomiting or nausea and cannot handle clear liquids lasting longer than 1 day, call your surgeon  Protein Shake   Drink at least 2 ounces of shake 5-6 times per day   Each serving of protein shakes (usually 8 - 12 ounces) should have a minimum of:  o 15 grams of protein  o And no more than 5 grams of carbohydrate    Goal for protein each day: o Men = 80 grams per day o Women = 60 grams per day   Protein powder may be added to fluids such as non-fat milk or Lactaid milk or Soy milk (limit to 35 grams added protein powder per serving)  Hydration   Slowly increase the amount of water and other clear liquids as tolerated (See Acceptable Fluids)   Slowly increase the amount of protein shake as tolerated     Sip fluids slowly and throughout the day   May use sugar substitutes in small amounts (no more than 6 - 8 packets per day; i.e. Splenda)  Fluid Goal   The first goal is to drink at least 8 ounces of protein shake/drink per day (or as directed by the nutritionist); some examples of protein shakes are Syntrax Nectar, Adkins Advantage, EAS Edge HP, and Unjury. See handout from pre-op Bariatric Education Class: o Slowly increase the amount of protein shake you drink as tolerated o You may find it easier to slowly sip shakes throughout the day o It is important to get your proteins in first   Your fluid goal is to drink 64 - 100 ounces of fluid daily o It may take a few weeks to build up to this   32 oz (or more) should be clear liquids  And    32 oz (or more) should be full liquids (see below for examples)   Liquids should not contain sugar, caffeine, or  carbonation  Clear Liquids:   Water or Sugar-free flavored water (i.e. Fruit H2O, Propel)   Decaffeinated coffee or tea (sugar-free)   Crystal Lite, Wyler's Lite, Minute Maid Lite   Sugar-free Jell-O   Bouillon or broth   Sugar-free Popsicle:   *Less than 20 calories each; Limit 1 per day  Full Liquids: Protein Shakes/Drinks + 2 choices per day of other full liquids   Full liquids must be: o No More Than 12 grams of Carbs per serving  o No   More Than 3 grams of Fat per serving   Strained low-fat cream soup   Non-Fat milk   Fat-free Lactaid Milk   Sugar-free yogurt (Dannon Lite & Fit, Greek yogurt)      Vitamins and Minerals   Start 1 day after surgery unless otherwise directed by your surgeon   2 Chewable Multivitamin / Multimineral Supplement with iron (i.e. Centrum for Adults)   Vitamin B-12, 350 - 500 micrograms sub-lingual (place tablet under the tongue) each day   Chewable Calcium Citrate with Vitamin D-3 (Example: 3 Chewable Calcium Plus 600 with Vitamin D-3) o Take 500 mg three (3) times a day for a total of 1500 mg each day o Do not take all 3 doses of calcium at one time as it may cause constipation, and you can only absorb 500 mg  at a time  o Do not mix multivitamins containing iron with calcium supplements; take 2 hours apart o Do not substitute Tums (calcium carbonate) for your calcium   Menstruating women and those at risk for anemia (a blood disease that causes weakness) may need extra iron o Talk with your doctor to see if you need more iron   If you need extra iron: Total daily Iron recommendation (including Vitamins) is 50 to 100 mg Iron/day   Do not stop taking or change any vitamins or minerals until you talk to your nutritionist or surgeon   Your nutritionist and/or surgeon must approve all vitamin and mineral supplements   Activity and Exercise: It is important to continue walking at home.  Limit your physical activity as instructed by your doctor.  During  this time, use these guidelines:   Do not lift anything greater than ten (10) pounds for at least two (2) weeks   Do not go back to work or drive until your surgeon says you can   You may have sex when you feel comfortable  o It is VERY important for female patients to use a reliable birth control method; fertility often increases after surgery  o Do not get pregnant for at least 18 months   Start exercising as soon as your doctor tells you that you can o Make sure your doctor approves any physical activity   Start with a simple walking program   Walk 5-15 minutes each day, 7 days per week.    Slowly increase until you are walking 30-45 minutes per day Consider joining our BELT program. [phone] or email [email or web address]   Special Instructions Things to remember:   Free counseling is available for you and your family through collaboration between [facility] and [employer]. Please call [phone] and leave a message   Use your CPAP when sleeping if this applies to you   [facility] has a free Bariatric Surgery Support Group that meets monthly, the 3rd Thursday, 6 pm,  Education Center Classrooms You can see classes online at [email or web address]   It is very important to keep all follow up appointments with your surgeon, nutritionist, primary care physician, and behavioral health practitioner o After the first year, please follow up with your bariatric surgeon and nutritionist at least once a year in order to maintain best weight loss results [facility]: [phone] [facility]: [phone] Bariatric Nurse Coordinator: [phone]     

## 2013-09-09 NOTE — Progress Notes (Signed)
Patient ID: Paula Austin, female   DOB: 16-May-1966, 47 y.o.   MRN: [id number] [facility] Progress Note:   2 Days Post-Op  Subjective: Mental status is clear Objective: Vital signs in last 24 hours: Temp:  [98.2 F (36.8 C)-99.5 F (37.5 C)] 98.9 F (37.2 C) (09/24 0643) Pulse Rate:  [87-99] 87 (09/24 0643) Resp:  [18-20] 20 (09/24 0643) BP: (140-177)/(82-110) 164/97 mmHg (09/24 0643) SpO2:  [97 %-100 %] 100 % (09/24 0643)  Intake/Output from previous day: 09/23 0701 - 09/24 0700 In: 2435 [P.O.:60; I.V.:2375] Out: 1520 [Urine:1400; Drains:120] Intake/Output this shift:    Physical Exam: Work of breathing is normal  Lab Results:  Results for orders placed during the hospital encounter of 09/07/13 (from the past 48 hour(s))  CBC     Status: Abnormal   Collection Time    09/07/13  2:15 PM      Result Value Range   WBC 15.3 (*) 4.0 - 10.5 K/uL   RBC 4.26  3.87 - 5.11 MIL/uL   Hemoglobin 12.9  12.0 - 15.0 g/dL   HCT 38.7  56.4 - 33.2 %   MCV 90.6  78.0 - 100.0 fL   MCH 30.3  26.0 - 34.0 pg   MCHC 33.4  30.0 - 36.0 g/dL   RDW 95.1  88.4 - 16.6 %   Platelets 217  150 - 400 K/uL  CREATININE, SERUM     Status: Abnormal   Collection Time    09/07/13  2:15 PM      Result Value Range   Creatinine, Ser 0.82  0.50 - 1.10 mg/dL   GFR calc non Af Amer 83 (*) >90 mL/min   GFR calc Af Amer >90  >90 mL/min   Comment: (NOTE)     The eGFR has been calculated using the CKD EPI equation.     This calculation has not been validated in all clinical situations.     eGFR's persistently <90 mL/min signify possible Chronic Kidney     Disease.  CBC WITH DIFFERENTIAL     Status: Abnormal   Collection Time    09/08/13  4:50 AM      Result Value Range   WBC 16.7 (*) 4.0 - 10.5 K/uL   RBC 3.80 (*) 3.87 - 5.11 MIL/uL   Hemoglobin 11.5 (*) 12.0 - 15.0 g/dL   HCT 06.3 (*) 01.6 - 01.0 %   MCV 90.3  78.0 - 100.0 fL   MCH 30.3  26.0 - 34.0 pg   MCHC 33.5  30.0 - 36.0 g/dL   RDW  93.2  35.5 - 73.2 %   Platelets 217  150 - 400 K/uL   Neutrophils Relative % 83 (*) 43 - 77 %   Neutro Abs 13.9 (*) 1.7 - 7.7 K/uL   Lymphocytes Relative 9 (*) 12 - 46 %   Lymphs Abs 1.4  0.7 - 4.0 K/uL   Monocytes Relative 8  3 - 12 %   Monocytes Absolute 1.4 (*) 0.1 - 1.0 K/uL   Eosinophils Relative 0  0 - 5 %   Eosinophils Absolute 0.0  0.0 - 0.7 K/uL   Basophils Relative 0  0 - 1 %   Basophils Absolute 0.0  0.0 - 0.1 K/uL  HEMOGLOBIN AND HEMATOCRIT, BLOOD     Status: Abnormal   Collection Time    09/08/13  4:31 PM      Result Value Range   Hemoglobin 11.7 (*) 12.0 - 15.0 g/dL  HCT 34.8 (*) 36.0 - 46.0 %  CBC WITH DIFFERENTIAL     Status: Abnormal   Collection Time    09/09/13 12:01 AM      Result Value Range   WBC 12.2 (*) 4.0 - 10.5 K/uL   RBC 3.75 (*) 3.87 - 5.11 MIL/uL   Hemoglobin 11.3 (*) 12.0 - 15.0 g/dL   HCT 16.1 (*) 09.6 - 04.5 %   MCV 90.4  78.0 - 100.0 fL   MCH 30.1  26.0 - 34.0 pg   MCHC 33.3  30.0 - 36.0 g/dL   RDW 40.9  81.1 - 91.4 %   Platelets 206  150 - 400 K/uL   Neutrophils Relative % 76  43 - 77 %   Neutro Abs 9.3 (*) 1.7 - 7.7 K/uL   Lymphocytes Relative 18  12 - 46 %   Lymphs Abs 2.2  0.7 - 4.0 K/uL   Monocytes Relative 6  3 - 12 %   Monocytes Absolute 0.7  0.1 - 1.0 K/uL   Eosinophils Relative 0  0 - 5 %   Eosinophils Absolute 0.0  0.0 - 0.7 K/uL   Basophils Relative 0  0 - 1 %   Basophils Absolute 0.0  0.0 - 0.1 K/uL    Radiology/Results: Dg Ugi W/water Sol Cm  09/08/2013   CLINICAL DATA:  Morbid obesity.  EXAM: Upper GI with water-soluble contrast and KUB  TECHNIQUE: After obtaining a scout radiograph a routine upper GI series was performed using water-soluble contrast.  COMPARISON:  06/12/2013  FLUOROSCOPY TIME:  0 min 57 seconds  FINDINGS: The esophageal mucosa and motility are normal. There is a small gastric remnant. The anastomosis with the small bowel is widely patent. No extravasation of contrast.  IMPRESSION: Satisfactory  postoperative appearance of the gastric remnant and anastomosis with the small bowel.   Electronically Signed   By: Geanie Cooley   On: 09/08/2013 13:32    Anti-infectives: Anti-infectives   None      Assessment/Plan: Problem List: Patient Active Problem List   Diagnosis Date Noted  . Routine general medical examination at a health care facility 05/22/2013  . Perimenopausal 05/22/2013  . 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 Hypertension 05/02/2011  . Sleep apnea 05/02/2011  . morbid obesity BMI 52 05/02/2011    Feeling much better this morning.  Swallow OK.  Postop nausea yesterday.  Productive cough after difficult intubation.  Will advance to PD 2 diet.   2 Days Post-Op    LOS: 2 days   Matt B. Daphine Deutscher, MD, [facility] [phone] beeper [phone]  09/09/2013 8:31 AM

## 2013-09-10 NOTE — Progress Notes (Signed)
Discharge summary sent to payer through [facility]  

## 2013-09-22 ENCOUNTER — Encounter: Payer: BC Managed Care – PPO | Attending: Surgery | Admitting: *Deleted

## 2013-09-22 VITALS — Ht 68.0 in | Wt 320.5 lb

## 2013-09-22 DIAGNOSIS — Z713 Dietary counseling and surveillance: Secondary | ICD-10-CM | POA: Insufficient documentation

## 2013-09-22 DIAGNOSIS — E669 Obesity, unspecified: Secondary | ICD-10-CM | POA: Insufficient documentation

## 2013-09-23 ENCOUNTER — Encounter: Payer: Self-pay | Admitting: *Deleted

## 2013-09-23 NOTE — Progress Notes (Signed)
Bariatric Class:  Appt start time: 1530 end time:  1630.  2 Week Post-Operative Nutrition Class  Patient was seen on 09/22/13 for [facility]   Surgery date: 09/07/13  Surgery type: RYGB  Start weight at [facility]: 350.9 lbs (06/17/13)  Weight @ Pre-Op Class: 352.5 lbs (08/20/13)  Weight today: 320.5 lbs Weight change:  32.0 lbs Total weight loss: 32.0 lbs  TANITA BODY COMP RESULTS   08/20/13  09/22/13  BMI (kg/m^2)  53.6  48.7  Fat Mass (lbs)  180.5  180.0  Fat Free Mass (lbs)  172.0  140.5  Total Body Water (lbs)  126.0  103.0   The following the learning objectives were met by the patient during this course:  Identifies Phase 3A (Soft, High Proteins) Dietary Goals and will begin from 2 weeks post-operatively to 2 months post-operatively  Identifies appropriate sources of fluids and proteins   States protein recommendations and appropriate sources post-operatively  Identifies the need for appropriate texture modifications, mastication, and bite sizes when consuming solids  Identifies appropriate multivitamin and calcium sources post-operatively  Describes the need for physical activity post-operatively and will follow MD recommendations  States when to call healthcare provider regarding medication questions or post-operative complications  Handouts given during class include:  Phase 3A: Soft, High Protein Diet Handout  Samples given during class include:   PB2: 1 pkt Lot: NONE; Exp: 12/29/13  Follow-Up Plan: Patient will follow-up at [facility] in 6 weeks for 2 month post-op nutrition visit for diet advancement per MD.

## 2013-09-23 NOTE — Patient Instructions (Signed)
Patient to follow Phase 3A-Soft, High Protein Diet and follow-up at [facility] in 6 weeks for 2 months post-op nutrition visit for diet advancement. 

## 2013-09-25 ENCOUNTER — Encounter (INDEPENDENT_AMBULATORY_CARE_PROVIDER_SITE_OTHER): Payer: Self-pay | Admitting: Surgery

## 2013-09-25 ENCOUNTER — Ambulatory Visit (INDEPENDENT_AMBULATORY_CARE_PROVIDER_SITE_OTHER): Payer: BC Managed Care – PPO | Admitting: Surgery

## 2013-09-25 VITALS — BP 122/78 | HR 70 | Temp 97.2°F | Resp 16 | Ht 68.0 in | Wt 319.6 lb

## 2013-09-25 DIAGNOSIS — Z9884 Bariatric surgery status: Secondary | ICD-10-CM

## 2013-09-25 NOTE — Patient Instructions (Signed)
Thanks for your patience.  If you need further assistance after leaving the office, please call our office and speak with a [facility] nurse.  [phone].  If you want to leave a message for Dr. Merve Hotard, please call his office phone at [phone]. 

## 2013-09-25 NOTE — Progress Notes (Signed)
Paula Austin 47 y.o.  Body mass index is 48.61 kg/(m^2).  Patient Active Problem List   Diagnosis Date Noted  . 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 Routine general medical examination at a health care facility 05/22/2013  . Perimenopausal 05/22/2013  . 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 Hypertension 05/02/2011  . Sleep apnea 05/02/2011  . morbid obesity BMI 52 05/02/2011    Allergies  Allergen Reactions  . Oxycodone Anxiety    Objects moving around room; felt she was out of control and felt she was going crazy    Past Surgical History  Procedure Laterality Date  . Breath tek h pylori N/A 05/26/2013    Procedure: BREATH TEK H PYLORI;  Surgeon: Valarie Merino, MD;  Location: [facility];  Service: General;  Laterality: N/A;  . Thyroidectomy  10/30/02  . Breast surgery  11/15/06    Reduction  . Tubal ligation  08/25/98  . 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  Way down by at least 20 pounds today. She is having low blood pressures and I told her to go see Tivis Ringer about maybe reducing her Vasotec hydrochlorothiazide. I encourage her to increase her activity level. She's doing very well after her Y. Gastric bypass. I'll see her back in 8 weeks. Matt B. Daphine Deutscher, MD, [facility] [phone] beeper [phone]  09/25/2013 10:32 AM

## 2013-10-28 ENCOUNTER — Telehealth: Payer: Self-pay | Admitting: Dietician

## 2013-10-28 NOTE — Telephone Encounter (Signed)
Emailed Martie Lee the Phase IIIB Post-Op diet with instructions to follow it starting at 8 weeks.

## 2013-11-03 ENCOUNTER — Ambulatory Visit: Payer: BC Managed Care – PPO | Admitting: *Deleted

## 2013-11-25 ENCOUNTER — Encounter: Payer: BC Managed Care – PPO | Attending: Surgery | Admitting: Dietician

## 2013-11-25 VITALS — Ht 68.0 in | Wt 303.0 lb

## 2013-11-25 DIAGNOSIS — E669 Obesity, unspecified: Secondary | ICD-10-CM

## 2013-11-25 DIAGNOSIS — Z713 Dietary counseling and surveillance: Secondary | ICD-10-CM | POA: Insufficient documentation

## 2013-11-25 NOTE — Progress Notes (Signed)
  Follow-up visit:  9 Weeks Post-Operative RYGB Surgery  Medical Nutrition Therapy:  Appt start time: 1515 end time:  1545.  Primary concerns today: Post-operative Bariatric Surgery Nutrition Management. Naveena returns with a 17.5 weight loss. Tolerating most foods, though "cheating" sometimes tried spaghetti and egg nog. Said she is craving some sweets.   Surgery date: 09/07/13  Surgery type: RYGB  Start weight at [facility]: 350.9 lbs (06/17/13)  Weight @ Pre-Op Class: 352.5 lbs (08/20/13)  Weight today: 303.0 lbs Weight change:  17.5 lbs, 22.5 lbs Total weight loss: 49.5 lbs  TANITA BODY COMP RESULTS   08/20/13  09/22/13 11/25/13  BMI (kg/m^2)  53.6  48.7 46.1  Fat Mass (lbs)  180.5  180.0 157.5  Fat Free Mass (lbs)  172.0  140.5 145.5  Total Body Water (lbs)  126.0  103.0 106.5    Preferred Learning Style:   No preference indicated   Learning Readiness:   Ready  24-hr recall: B (AM): muscle milk or 1 egg (6-25 g protein) Snk (AM): sugar free gelatin  L (PM): salad or wrap with 3 oz meats or beans (21 g protein) Snk (3 PM): cheese, feels hungry at 3 PM (6 g protein) D (PM): 3 oz fish, chicken or hamburger patties (21 g protein) Snk (PM): none  Fluid intake: 64 oz water, clear fruit water, protein shake Estimated total protein intake: 50-75 g protein  Medications: see list Supplementation: Taking  Using straws: No Drinking while eating: "not a lot" Hair loss: Yes, though was having some before Carbonated beverages: No  N/V/D/C: vomited when had spaghetti, had some constipation that resolved Dumping syndrome: Yes, on spaghetti  Recent physical activity:  Walk 2 x day for 20 minutes each time  Progress Towards Goal(s):  In progress.  Handouts given during visit include:  Phase 3 B High Protein    Nutritional Diagnosis:  [place]-3.3 Overweight/obesity related to past poor dietary habits and physical inactivity as evidenced by patient w/ recent RYGB surgery following dietary  guidelines for continued weight loss.    Intervention:  Nutrition education/diet advancement.  Teaching Method Utilized:  Visual Auditory  Barriers to learning/adherence to lifestyle change: none  Demonstrated degree of understanding via:  Teach Back   Monitoring/Evaluation:  Dietary intake, exercise, lap band fills, and body weight. Follow up in 1 month for 3.5 month post-op visit.

## 2013-11-25 NOTE — Patient Instructions (Signed)
Goals:  Follow Phase 3B: High Protein + Non-Starchy Vegetables  Eat 3-6 small meals/snacks, every 3-5 hrs  Increase lean protein foods to meet 60g goal  Increase fluid intake to 64oz +  Avoid drinking 15 minutes before, during and 30 minutes after eating  Aim for >30 min of physical activity daily  

## 2013-11-27 ENCOUNTER — Ambulatory Visit (INDEPENDENT_AMBULATORY_CARE_PROVIDER_SITE_OTHER): Payer: BC Managed Care – PPO | Admitting: Surgery

## 2013-12-30 ENCOUNTER — Encounter (INDEPENDENT_AMBULATORY_CARE_PROVIDER_SITE_OTHER): Payer: Self-pay | Admitting: Surgery

## 2013-12-30 ENCOUNTER — Ambulatory Visit (INDEPENDENT_AMBULATORY_CARE_PROVIDER_SITE_OTHER): Payer: BC Managed Care – PPO | Admitting: Surgery

## 2013-12-30 VITALS — BP 119/77 | HR 82 | Temp 97.8°F | Resp 18 | Ht 68.0 in | Wt 290.8 lb

## 2013-12-30 DIAGNOSIS — Z9884 Bariatric surgery status: Secondary | ICD-10-CM

## 2013-12-30 NOTE — Progress Notes (Signed)
Paula Austin 48 y.o.  Body mass index is 44.23 kg/(m^2).  Patient Active Problem List   Diagnosis Date Noted  . 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 Routine general medical examination at a health care facility 05/22/2013  . Perimenopausal 05/22/2013  . 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 Hypertension 05/02/2011  . Sleep apnea 05/02/2011  . morbid obesity BMI 52 05/02/2011    Allergies  Allergen Reactions  . Oxycodone Anxiety    Objects moving around room; felt she was out of control and felt she was going crazy    Past Surgical History  Procedure Laterality Date  . Breath tek h pylori N/A 05/26/2013    Procedure: BREATH TEK H PYLORI;  Surgeon: Valarie MerinoMatthew B Trevia Nop, MD;  [facility];  Service: General;  Laterality: N/A;  . Thyroidectomy  10/30/02  . Breast surgery  11/15/06    Reduction  . Tubal ligation  08/25/98  . 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  Paula Austin  comes in today and she is 4 months out from her Roux-en-Y gastric bypass. She has lost 50 pounds thus far. She looks good. She is not having any issues other than constipation and is recently starting MiraLAX. She is taking her vitamins and exercising. I think she is doing very well I want to see her at her one year ever screen September. She knows to return sooner if her problem develops. Matt B. Daphine DeutscherMartin, MD, [facility]  [facility] [phone] beeper [phone]  12/30/2013 5:15 PM  She comes in status post right 50 pounds.

## 2013-12-30 NOTE — Patient Instructions (Signed)
You are doing great. Continue taking your vitamins and watching your diet. Continue your exercise regimen.   Will see you in September--1 year

## 2013-12-31 ENCOUNTER — Ambulatory Visit: Payer: BC Managed Care – PPO | Admitting: Dietician

## 2014-04-16 ENCOUNTER — Encounter: Payer: Self-pay | Admitting: Family Medicine

## 2014-04-16 ENCOUNTER — Ambulatory Visit (INDEPENDENT_AMBULATORY_CARE_PROVIDER_SITE_OTHER): Payer: BC Managed Care – PPO | Admitting: Family Medicine

## 2014-04-16 VITALS — BP 137/80 | HR 79 | Temp 99.0°F | Ht 68.0 in | Wt 272.0 lb

## 2014-04-16 DIAGNOSIS — Z1211 Encounter for screening for malignant neoplasm of colon: Secondary | ICD-10-CM | POA: Insufficient documentation

## 2014-04-16 DIAGNOSIS — G4730 Sleep apnea, unspecified: Secondary | ICD-10-CM

## 2014-04-16 DIAGNOSIS — E890 Postprocedural hypothyroidism: Secondary | ICD-10-CM

## 2014-04-16 DIAGNOSIS — Z0000 Encounter for general adult medical examination without abnormal findings: Secondary | ICD-10-CM

## 2014-04-16 DIAGNOSIS — I10 Essential (primary) hypertension: Secondary | ICD-10-CM

## 2014-04-16 DIAGNOSIS — E669 Obesity, unspecified: Secondary | ICD-10-CM

## 2014-04-16 LAB — COMPLETE METABOLIC PANEL WITH GFR
ALT: 23 U/L (ref 0–35)
AST: 18 U/L (ref 0–37)
Albumin: 4.1 g/dL (ref 3.5–5.2)
Alkaline Phosphatase: 80 U/L (ref 39–117)
BILIRUBIN TOTAL: 0.7 mg/dL (ref 0.2–1.2)
BUN: 14 mg/dL (ref 6–23)
CO2: 32 meq/L (ref 19–32)
CREATININE: 0.67 mg/dL (ref 0.50–1.10)
Calcium: 9.7 mg/dL (ref 8.4–10.5)
Chloride: 102 mEq/L (ref 96–112)
GLUCOSE: 92 mg/dL (ref 70–99)
Potassium: 3.6 mEq/L (ref 3.5–5.3)
Sodium: 140 mEq/L (ref 135–145)
TOTAL PROTEIN: 7.2 g/dL (ref 6.0–8.3)

## 2014-04-16 LAB — CBC
HEMATOCRIT: 36.1 % (ref 36.0–46.0)
HEMOGLOBIN: 12.3 g/dL (ref 12.0–15.0)
MCH: 29.9 pg (ref 26.0–34.0)
MCHC: 34.1 g/dL (ref 30.0–36.0)
MCV: 87.6 fL (ref 78.0–100.0)
Platelets: 255 10*3/uL (ref 150–400)
RBC: 4.12 MIL/uL (ref 3.87–5.11)
RDW: 14 % (ref 11.5–15.5)
WBC: 5.2 10*3/uL (ref 4.0–10.5)

## 2014-04-16 LAB — LDL CHOLESTEROL, DIRECT: LDL DIRECT: 100 mg/dL — AB

## 2014-04-16 MED ORDER — LEVOTHYROXINE SODIUM 125 MCG PO TABS: 125.0000 ug | ORAL_TABLET | Freq: Every day | ORAL | Status: DC

## 2014-04-16 MED ORDER — LISINOPRIL-HYDROCHLOROTHIAZIDE 20-25 MG PO TABS: 1.0000 | ORAL_TABLET | Freq: Every day | ORAL | Status: DC

## 2014-04-16 NOTE — Assessment & Plan Note (Signed)
80 lb wt loss and down to BMI=40

## 2014-04-16 NOTE — Assessment & Plan Note (Signed)
Good control.  Has trouble remembering 2nd enalapril.  Will simplify regimen.

## 2014-04-16 NOTE — Patient Instructions (Signed)
Great work on getting healthier Work with my nurse about getting a colonoscopy. Please do get your mammogram The new single blood pressure pill replaces both of your previous pills. If you can hold off, don't fill your thyroid medicine until after you hear about the blood test results. I am on vacation next week so it will be over a week until you hear from me about the blood. You will be due for a pap smear in December.

## 2014-04-16 NOTE — Assessment & Plan Note (Signed)
Needs colonoscopy as she turns 48 in two weeks.

## 2014-04-16 NOTE — Assessment & Plan Note (Signed)
Symptoms seem to be resolving with wt loss

## 2014-04-16 NOTE — Assessment & Plan Note (Signed)
Doing great with fabulous wt loss and great life style increase in exercise.

## 2014-04-16 NOTE — Progress Notes (Signed)
   Subjective:    Patient ID: Paula FellsSabrina Austin, female    DOB: 07-10-1966, 48 y.o.   MRN: [id number]  HPI  Doing great since bypass surg in Sept 2014.  80lbs wt loss.  Exercising daily.  Gardening.   HPDP will be due for Pap in Dec.  Colonoscopy when turns 48 in 2 weeks.   Also needs standard labs checked in FU of gastric bypass surg.        Review of Systems Denies CP, DOE, bleeding, swelling, change of bowel, bladder or appetite.     Objective:   Physical Exam HEENT normal Neck supple Lungs clear Cardiac RRR without m or g Abd benign Pelvic deferred to Dec Ext normal Neuro normal       Assessment & Plan:

## 2014-04-16 NOTE — Assessment & Plan Note (Addendum)
Asymptomatic.  Check TSH

## 2014-04-17 LAB — TSH: TSH: 1.94 u[IU]/mL (ref 0.350–4.500)

## 2014-04-17 LAB — FERRITIN: FERRITIN: 109 ng/mL (ref 10–291)

## 2014-04-17 LAB — VITAMIN B12: Vitamin B-12: 1456 pg/mL — ABNORMAL HIGH (ref 211–911)

## 2014-04-22 ENCOUNTER — Encounter: Payer: Self-pay | Admitting: Family Medicine

## 2014-04-28 ENCOUNTER — Encounter: Payer: Self-pay | Admitting: Gastroenterology

## 2014-06-02 ENCOUNTER — Telehealth: Payer: Self-pay | Admitting: *Deleted

## 2014-06-02 ENCOUNTER — Ambulatory Visit (AMBULATORY_SURGERY_CENTER): Payer: Self-pay | Admitting: *Deleted

## 2014-06-02 VITALS — Ht 68.0 in | Wt 268.8 lb

## 2014-06-02 DIAGNOSIS — Z1211 Encounter for screening for malignant neoplasm of colon: Secondary | ICD-10-CM

## 2014-06-02 MED ORDER — MOVIPREP 100 G PO SOLR: ORAL | Status: DC

## 2014-06-02 NOTE — Progress Notes (Signed)
Patient denies any allergies to eggs or soy. Patient states "during last surgery they hard time getting tube down and left me hoarse and also hard time one other surgery". Patient also states "had couple surgeries without any problems getting it down", Flag sent to Bill,CRNA. Patient denies any oxygen use at home and does not take any diet/weight loss medications. EMMI education assisgned to patient on colonoscopy, this was explained and instructions given to patient.

## 2014-06-02 NOTE — Telephone Encounter (Signed)
Noted. [facility],CMA.

## 2014-06-02 NOTE — Telephone Encounter (Signed)
Ok, [facility] during my next available EUS Thursday time with MAC.  Can you check with Patty about schedule, availability?

## 2014-06-02 NOTE — Telephone Encounter (Signed)
         Paula Austin, Paula Austin ','<More Detail >>       Hildred PriestWilliam C Walton, CRNA [phone])       Sent: Wed June 02, 2014 1:02 PM    To: Lucia Gaskinsobbin H Kell Ferris, RN                   Message     [facility] Anesthesia assessed her as a difficult airway and used elective a/w [facility]. She is therefore not appropriate for [facility].    ----- Message -----    From: Lucia Gaskinsobbin H Lenorris Karger, RN    Sent: 06/02/2014 9:40 AM    To: Hildred PriestWilliam C Walton, CRNA        Please review patient's HX, patient for direct screening colonoscopy, has HX difficult intubation. Pt states they had hard time getting tube down x 2 but also had surgeries without difficulty. [facility] for [facility]. Thanks,Britanie Harshman PV                          Dr.Jacobs, Okay for direct hospital screening colonoscopy or office visit first? Thanks Jalayia Bagheri,PV

## 2014-06-03 NOTE — Telephone Encounter (Signed)
Left message on machine to call back  

## 2014-06-04 NOTE — Telephone Encounter (Signed)
Letter mailed to the home and sent to my chart for the pt to contact our office to set up procedure

## 2014-06-04 NOTE — Telephone Encounter (Signed)
Left message on machine to call back  

## 2014-06-09 ENCOUNTER — Telehealth: Payer: Self-pay

## 2014-06-09 ENCOUNTER — Encounter (HOSPITAL_COMMUNITY): Payer: Self-pay | Admitting: Pharmacy Technician

## 2014-06-09 ENCOUNTER — Other Ambulatory Visit: Payer: Self-pay

## 2014-06-09 DIAGNOSIS — Z1211 Encounter for screening for malignant neoplasm of colon: Secondary | ICD-10-CM

## 2014-06-09 NOTE — Telephone Encounter (Signed)
Pt returned call and [facility] for 06/17/14 11 am she is aware and new instructions have been mailed to the home

## 2014-06-11 ENCOUNTER — Encounter (HOSPITAL_COMMUNITY): Payer: Self-pay | Admitting: *Deleted

## 2014-06-16 ENCOUNTER — Encounter: Payer: BC Managed Care – PPO | Admitting: Gastroenterology

## 2014-06-17 ENCOUNTER — Ambulatory Visit (HOSPITAL_COMMUNITY): Payer: BC Managed Care – PPO | Admitting: *Deleted

## 2014-06-17 ENCOUNTER — Encounter (HOSPITAL_COMMUNITY)
Admission: RE | Disposition: A | Discharge status: Home / Self Care | Payer: Self-pay | Source: Ambulatory Visit | Attending: Gastroenterology

## 2014-06-17 ENCOUNTER — Encounter (HOSPITAL_COMMUNITY): Payer: Self-pay | Admitting: *Deleted

## 2014-06-17 ENCOUNTER — Ambulatory Visit (HOSPITAL_COMMUNITY)
Admission: RE | Admit: 2014-06-17 | Discharge: 2014-06-17 | Disposition: A | Discharge status: Home / Self Care | Payer: BC Managed Care – PPO | Source: Ambulatory Visit | Attending: Gastroenterology | Admitting: Gastroenterology

## 2014-06-17 ENCOUNTER — Encounter (HOSPITAL_COMMUNITY): Payer: BC Managed Care – PPO | Admitting: *Deleted

## 2014-06-17 DIAGNOSIS — Z79899 Other long term (current) drug therapy: Secondary | ICD-10-CM | POA: Insufficient documentation

## 2014-06-17 DIAGNOSIS — Z9884 Bariatric surgery status: Secondary | ICD-10-CM | POA: Insufficient documentation

## 2014-06-17 DIAGNOSIS — Z1211 Encounter for screening for malignant neoplasm of colon: Secondary | ICD-10-CM

## 2014-06-17 DIAGNOSIS — I10 Essential (primary) hypertension: Secondary | ICD-10-CM | POA: Insufficient documentation

## 2014-06-17 DIAGNOSIS — E890 Postprocedural hypothyroidism: Secondary | ICD-10-CM | POA: Insufficient documentation

## 2014-06-17 DIAGNOSIS — G4733 Obstructive sleep apnea (adult) (pediatric): Secondary | ICD-10-CM | POA: Insufficient documentation

## 2014-06-17 HISTORY — DX: Hypothyroidism, unspecified: E03.9

## 2014-06-17 HISTORY — DX: Other specified postprocedural states: Z98.890

## 2014-06-17 HISTORY — PX: COLONOSCOPY WITH PROPOFOL: SHX5780

## 2014-06-17 HISTORY — DX: Nausea with vomiting, unspecified: R11.2

## 2014-06-17 SURGERY — COLONOSCOPY WITH PROPOFOL
Anesthesia: Monitor Anesthesia Care

## 2014-06-17 MED ORDER — GLYCOPYRROLATE 0.2 MG/ML IJ SOLN
INTRAMUSCULAR | Status: DC | PRN
  Administered 2014-06-17: 0.2 mg via INTRAVENOUS

## 2014-06-17 MED ORDER — LACTATED RINGERS IV SOLN
INTRAVENOUS | Status: DC | PRN
  Administered 2014-06-17: 10:00:00 via INTRAVENOUS

## 2014-06-17 MED ORDER — ESMOLOL HCL 10 MG/ML IV SOLN
INTRAVENOUS | Status: DC | PRN
  Administered 2014-06-17: 10 mg via INTRAVENOUS

## 2014-06-17 MED ORDER — PROPOFOL 10 MG/ML IV BOLUS
INTRAVENOUS | Status: AC
  Filled 2014-06-17: qty 20

## 2014-06-17 MED ORDER — LIDOCAINE HCL (CARDIAC) 20 MG/ML IV SOLN
INTRAVENOUS | Status: DC | PRN
Start: 2014-06-17 — End: 2014-06-17
  Administered 2014-06-17: 50 mg via INTRAVENOUS

## 2014-06-17 MED ORDER — PROPOFOL INFUSION 10 MG/ML OPTIME
INTRAVENOUS | Status: DC | PRN
  Administered 2014-06-17: 300 ug/kg/min via INTRAVENOUS

## 2014-06-17 MED ORDER — SODIUM CHLORIDE 0.9 % IV SOLN: INTRAVENOUS | Status: DC

## 2014-06-17 SURGICAL SUPPLY — 22 items

## 2014-06-17 NOTE — Op Note (Signed)
[facility] [address] [place], [id number]   COLONOSCOPY PROCEDURE REPORT  PATIENT: Paula Austin, Paula Austin  MR#: [id number] BIRTHDATE: 1966-04-18 , 48  yrs. old GENDER: Female ENDOSCOPIST: Rachael Feeaniel P Jacobs, MD REFERRED XB:[id number]:William Leveda AnnaHensel, M.D. PROCEDURE DATE:  06/17/2014 PROCEDURE:   Colonoscopy, screening First Screening Colonoscopy - Avg.  risk and is 50 yrs.  old or older Yes.  Prior Negative Screening - Now for repeat screening. N/A  History of Adenoma - Now for follow-up colonoscopy & has been > or = to 3 yrs.  N/A  Polyps Removed Today? No.  Recommend repeat exam, <10 yrs? No. ASA CLASS:   Class III INDICATIONS:average risk screening. MEDICATIONS: MAC sedation, administered by CRNA  DESCRIPTION OF PROCEDURE:   After the risks benefits and alternatives of the procedure were thoroughly explained, informed consent was obtained.  A digital rectal exam revealed no abnormalities of the rectum.   The Pentax Ped Colon [id number] endoscope was introduced through the anus and advanced to the cecum, which was identified by both the appendix and ileocecal valve. No adverse events experienced.   The quality of the prep was excellent.  The instrument was then slowly withdrawn as the colon was fully examined.    COLON FINDINGS: A normal appearing cecum, ileocecal valve, and appendiceal orifice were identified.  The ascending, hepatic flexure, transverse, splenic flexure, descending, sigmoid colon and rectum appeared unremarkable.  No polyps or cancers were seen. Retroflexed views revealed no abnormalities. The time to cecum=3 minutes 00 seconds.  Withdrawal time=10 minutes 00 seconds.  The scope was withdrawn and the procedure completed. COMPLICATIONS: There were no complications.  ENDOSCOPIC IMPRESSION: Normal colon No polyps or cancers  RECOMMENDATIONS: You should continue to follow colorectal cancer screening guidelines for "routine risk" patients with a repeat  colonoscopy in 10 years.   eSigned:  Rachael Feeaniel P Jacobs, MD 06/17/2014 10:49 AM

## 2014-06-17 NOTE — H&P (Signed)
  HPI: This is a 48 yo woman here for routine risk colon cancer screening with colonoscopy    Past Medical History  Diagnosis Date  . Hypertension   . Thyroid disease   . Hyperlipidemia   . Goiter   . Morbid obesity   . History of kidney stones   . OSA on CPAP     uses cpap some nights, pt does know settings  . Sleep apnea   . Hypothyroidism   . GERD (gastroesophageal reflux disease)     occasional, none lately  . Difficult intubation 1999    "had thick neck" , trouble with intubatuion sept sept 2014 also  . PONV (postoperative nausea and vomiting) sept 2014    lots of coughing up phelgm    Past Surgical History  Procedure Laterality Date  . Breath tek h pylori N/A 05/26/2013    Procedure: BREATH TEK H PYLORI;  Surgeon: Valarie MerinoMatthew B Martin, MD;  [facility];  Service: General;  Laterality: N/A;  . Thyroidectomy  10/30/02  . Tubal ligation  08/25/98    had hard time getting ET tube down per pt.   . 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 Breast surgery Bilateral 11/15/06    Reduction    Current Facility-Administered Medications  Medication Dose Route Frequency Provider Last Rate Last Dose  . 0.9 %  sodium chloride infusion   Intravenous Continuous Rachael Feeaniel P Hoy Fallert, MD        Allergies as of 06/09/2014 - Review Complete 06/09/2014  Allergen Reaction Noted  . Oxycodone Anxiety 06/17/2013    Family History  Problem Relation Age of Onset  . Cancer Maternal Grandfather     lung  . Diabetes Father   . Diabetes Paternal Grandmother   . Colon cancer Neg Hx     History   Social History  . Marital Status: Single    Spouse Name: N/A    Number of Children: N/A  . Years of Education: N/A   Occupational History  . Not on file.   Social History Main Topics  . Smoking status: Never Smoker   . Smokeless tobacco: Never  Used  . Alcohol Use: Yes     Comment: occasional social  . Drug Use: No  . Sexual Activity: Not on file   Other Topics Concern  . Not on file   Social History Narrative  . No narrative on file      Physical Exam: BP 154/91  Temp(Src) 97.7 F (36.5 C) (Oral)  Resp 14  SpO2 100%  LMP 04/19/2014 Constitutional: generally well-appearing Psychiatric: alert and oriented x3 Abdomen: soft, nontender, nondistended, no obvious ascites, no peritoneal signs, normal bowel sounds     Assessment and plan: 48 y.o. female with routine risk for colon cancer  For screening colonoscopy today

## 2014-06-17 NOTE — Transfer of Care (Signed)
Immediate Anesthesia Transfer of Care Note  Patient: Paula Austin  Procedure(s) Performed: Procedure(s): COLONOSCOPY WITH PROPOFOL (N/A)  Patient Location: Endo Recovery  Anesthesia Type:MAC  Level of Consciousness: Patient easily awoken, sedated, comfortable, cooperative, following commands, responds to stimulation.   Airway & Oxygen Therapy: Patient spontaneously breathing, ventilating well, oxygen via [place].  Post-op Assessment: Report given to PACU RN, vital signs reviewed and stable, moving all extremities.   Post vital signs: Reviewed and stable.  Complications: No apparent anesthesia complications

## 2014-06-17 NOTE — Anesthesia Preprocedure Evaluation (Signed)
Anesthesia Evaluation  Patient identified by MRN, date of birth, ID band Patient awake    Reviewed: Allergy & Precautions, H&P , NPO status , Patient's Chart, lab work & pertinent test results  History of Anesthesia Complications (+) PONV and DIFFICULT AIRWAY  Airway Mallampati: II TM Distance: >3 FB Neck ROM: Full    Dental no notable dental hx.    Pulmonary neg pulmonary ROS, sleep apnea and Continuous Positive Airway Pressure Ventilation ,  breath sounds clear to auscultation  Pulmonary exam normal       Cardiovascular hypertension, Pt. on medications Rhythm:Regular Rate:Normal     Neuro/Psych negative neurological ROS  negative psych ROS   GI/Hepatic negative GI ROS, Neg liver ROS,   Endo/Other  Hypothyroidism   Renal/GU negative Renal ROS  negative genitourinary   Musculoskeletal negative musculoskeletal ROS (+)   Abdominal   Peds negative pediatric ROS (+)  Hematology negative hematology ROS (+)   Anesthesia Other Findings   Reproductive/Obstetrics negative OB ROS                           Anesthesia Physical Anesthesia Plan  ASA: III  Anesthesia Plan: General   Post-op Pain Management:    Induction:   Airway Management Planned: Simple Face Mask  Additional Equipment:   Intra-op Plan:   Post-operative Plan:   Informed Consent: I have reviewed the patients History and Physical, chart, labs and discussed the procedure including the risks, benefits and alternatives for the proposed anesthesia with the patient or authorized representative who has indicated his/her understanding and acceptance.   Dental advisory given  Plan Discussed with: CRNA  Anesthesia Plan Comments:         Anesthesia Quick Evaluation

## 2014-06-18 ENCOUNTER — Encounter (HOSPITAL_COMMUNITY): Payer: Self-pay | Admitting: Gastroenterology

## 2014-06-27 NOTE — Anesthesia Postprocedure Evaluation (Signed)
  Anesthesia Post-op Note  Patient: Paula Austin  Procedure(s) Performed: Procedure(s) (LRB): COLONOSCOPY WITH PROPOFOL (N/A)  Patient Location: PACU  Anesthesia Type: MAC  Level of Consciousness: awake and alert   Airway and Oxygen Therapy: Patient Spontanous Breathing  Post-op Pain: mild  Post-op Assessment: Post-op Vital signs reviewed, Patient's Cardiovascular Status Stable, Respiratory Function Stable, Patent Airway and No signs of Nausea or vomiting  Last Vitals:  Filed Vitals:   06/17/14 1104  BP: 147/89  Pulse: 58  Temp:   Resp: 16    Post-op Vital Signs: stable   Complications: No apparent anesthesia complications

## 2014-07-06 ENCOUNTER — Encounter: Payer: Self-pay | Admitting: Family Medicine

## 2014-10-01 ENCOUNTER — Other Ambulatory Visit: Payer: Self-pay

## 2015-04-18 ENCOUNTER — Other Ambulatory Visit: Payer: Self-pay | Admitting: Family Medicine

## 2015-05-11 ENCOUNTER — Encounter: Payer: Self-pay | Admitting: Family Medicine

## 2015-05-18 ENCOUNTER — Encounter: Payer: Self-pay | Admitting: Family Medicine

## 2015-07-27 ENCOUNTER — Ambulatory Visit (INDEPENDENT_AMBULATORY_CARE_PROVIDER_SITE_OTHER): Payer: BC Managed Care – PPO | Admitting: Family Medicine

## 2015-07-27 ENCOUNTER — Other Ambulatory Visit (HOSPITAL_COMMUNITY)
Admission: RE | Admit: 2015-07-27 | Discharge: 2015-07-27 | Disposition: A | Discharge status: Home / Self Care | Payer: BC Managed Care – PPO | Source: Ambulatory Visit | Attending: Family Medicine | Admitting: Family Medicine

## 2015-07-27 ENCOUNTER — Encounter: Payer: Self-pay | Admitting: Family Medicine

## 2015-07-27 VITALS — BP 126/85 | HR 98 | Temp 98.2°F | Ht 68.0 in | Wt 272.0 lb

## 2015-07-27 DIAGNOSIS — Z1159 Encounter for screening for other viral diseases: Secondary | ICD-10-CM | POA: Diagnosis not present

## 2015-07-27 DIAGNOSIS — Z0000 Encounter for general adult medical examination without abnormal findings: Secondary | ICD-10-CM

## 2015-07-27 DIAGNOSIS — E890 Postprocedural hypothyroidism: Secondary | ICD-10-CM

## 2015-07-27 DIAGNOSIS — Z01411 Encounter for gynecological examination (general) (routine) with abnormal findings: Secondary | ICD-10-CM | POA: Diagnosis present

## 2015-07-27 DIAGNOSIS — Z114 Encounter for screening for human immunodeficiency virus [HIV]: Secondary | ICD-10-CM

## 2015-07-27 DIAGNOSIS — Z1151 Encounter for screening for human papillomavirus (HPV): Secondary | ICD-10-CM | POA: Insufficient documentation

## 2015-07-27 DIAGNOSIS — I10 Essential (primary) hypertension: Secondary | ICD-10-CM

## 2015-07-27 DIAGNOSIS — Z124 Encounter for screening for malignant neoplasm of cervix: Secondary | ICD-10-CM

## 2015-07-27 LAB — CBC
HCT: 39.3 % (ref 36.0–46.0)
HEMOGLOBIN: 13.3 g/dL (ref 12.0–15.0)
MCH: 30.4 pg (ref 26.0–34.0)
MCHC: 33.8 g/dL (ref 30.0–36.0)
MCV: 89.9 fL (ref 78.0–100.0)
MPV: 10.8 fL (ref 8.6–12.4)
PLATELETS: 274 10*3/uL (ref 150–400)
RBC: 4.37 MIL/uL (ref 3.87–5.11)
RDW: 13.5 % (ref 11.5–15.5)
WBC: 6.8 10*3/uL (ref 4.0–10.5)

## 2015-07-27 LAB — COMPREHENSIVE METABOLIC PANEL
ALBUMIN: 4.1 g/dL (ref 3.6–5.1)
ALT: 18 U/L (ref 6–29)
AST: 16 U/L (ref 10–35)
Alkaline Phosphatase: 81 U/L (ref 33–130)
BUN: 16 mg/dL (ref 7–25)
CALCIUM: 10 mg/dL (ref 8.6–10.4)
CHLORIDE: 104 mmol/L (ref 98–110)
CO2: 28 mmol/L (ref 20–31)
CREATININE: 0.83 mg/dL (ref 0.50–1.05)
GLUCOSE: 96 mg/dL (ref 65–99)
POTASSIUM: 4.3 mmol/L (ref 3.5–5.3)
Sodium: 142 mmol/L (ref 135–146)
Total Bilirubin: 0.5 mg/dL (ref 0.2–1.2)
Total Protein: 7.5 g/dL (ref 6.1–8.1)

## 2015-07-27 LAB — LIPID PANEL
CHOL/HDL RATIO: 2.9 ratio (ref <=5.0)
Cholesterol: 196 mg/dL (ref 125–200)
HDL: 67 mg/dL (ref >=46)
LDL Cholesterol: 102 mg/dL (ref <130)
Triglycerides: 135 mg/dL (ref <150)
VLDL: 27 mg/dL (ref <30)

## 2015-07-27 NOTE — Patient Instructions (Signed)
You look great You will be up to date on everything after today's lab tests. Good news is the paps only need to be done once every three years. I should see you every year Get a flu shot each fall

## 2015-07-28 LAB — HEPATITIS C ANTIBODY: HCV AB: NEGATIVE

## 2015-07-28 LAB — HIV ANTIBODY (ROUTINE TESTING W REFLEX): HIV 1&2 Ab, 4th Generation: NONREACTIVE

## 2015-07-28 LAB — TSH: TSH: 1.418 u[IU]/mL (ref 0.350–4.500)

## 2015-07-28 NOTE — Assessment & Plan Note (Signed)
Good control.  Check labs.  Continue meds.

## 2015-07-28 NOTE — Assessment & Plan Note (Signed)
Low risk.  Check x 1.

## 2015-07-28 NOTE — Progress Notes (Signed)
   Subjective:    Patient ID: Paula Austin, female    DOB: [birth year], 49 y.o.   MRN: [id number]  HPI  Annual visit.  Doing quite well since significant wt loss with bariatric surgery.  Generally feels well.  Her weight has stabilized.  She would like to lose more.  Issues. Just had mammo at [facility].  I believe I have seen results but they are not yet scanned to chart.  She was told they are normal Due for pap.  No previous abnormal pap.  No bleeding Never screened for HIV or Hep C Had colonoscopy - normal. Hypertension - well controled on current meds.  States she is compliant. SP thryoidectomy, needs checked. Non smoker.  Exercises regularly.  No at risk behaviors.    Review of Systems 12 point ROS neg     Objective:   Physical Exam Neck no masses Lungs clear Cardiac RRR without m or g Abd benign Pelvic WNL Nabothean cysts on cervix.  Pap taken Ext Wnl Neuro WNL        Assessment & Plan:

## 2015-07-28 NOTE — Assessment & Plan Note (Signed)
Check status x 1.  Low risk.

## 2015-07-28 NOTE — Assessment & Plan Note (Signed)
Today's labs will bring her up to date on screening.

## 2015-07-28 NOTE — Assessment & Plan Note (Signed)
Check TSH 

## 2015-07-29 LAB — CYTOLOGY - PAP

## 2016-06-22 ENCOUNTER — Ambulatory Visit (INDEPENDENT_AMBULATORY_CARE_PROVIDER_SITE_OTHER): Payer: BC Managed Care – PPO | Admitting: *Deleted

## 2016-06-22 DIAGNOSIS — Z111 Encounter for screening for respiratory tuberculosis: Secondary | ICD-10-CM | POA: Diagnosis not present

## 2016-06-22 NOTE — Progress Notes (Signed)
   PPD placed Left Forearm.  Pt to return 06/25/16, Monday for reading.  Pt tolerated intradermal injection. Martin, Tamika L, RN          

## 2016-06-25 ENCOUNTER — Encounter: Payer: Self-pay | Admitting: *Deleted

## 2016-06-25 ENCOUNTER — Ambulatory Visit (INDEPENDENT_AMBULATORY_CARE_PROVIDER_SITE_OTHER): Payer: BC Managed Care – PPO | Admitting: *Deleted

## 2016-06-25 DIAGNOSIS — Z7689 Persons encountering health services in other specified circumstances: Secondary | ICD-10-CM

## 2016-06-25 DIAGNOSIS — Z111 Encounter for screening for respiratory tuberculosis: Secondary | ICD-10-CM

## 2016-06-25 LAB — TB SKIN TEST
Induration: 0 mm
TB Skin Test: NEGATIVE

## 2016-06-25 NOTE — Progress Notes (Signed)
   PPD Reading Note PPD read and results entered in EpicCare. Result: 0 mm induration. Interpretation: Negative If test not read within 48-72 hours of initial placement, patient advised to repeat in other arm 1-3 weeks after this test. Allergic reaction: no  Martin, Tamika L, RN  

## 2016-07-30 ENCOUNTER — Telehealth: Payer: Self-pay | Admitting: Family Medicine

## 2016-07-30 NOTE — Telephone Encounter (Signed)
Pt is calling for a refill on her Synthroid medication to be called in. jw

## 2016-07-31 MED ORDER — LEVOTHYROXINE SODIUM 125 MCG PO TABS: ORAL_TABLET | ORAL | 3 refills | Status: DC

## 2016-07-31 NOTE — Telephone Encounter (Signed)
  Rx sent.               rx sent

## 2016-08-02 ENCOUNTER — Encounter: Payer: BC Managed Care – PPO | Admitting: Family Medicine

## 2016-11-29 ENCOUNTER — Ambulatory Visit (INDEPENDENT_AMBULATORY_CARE_PROVIDER_SITE_OTHER): Payer: Commercial Managed Care - HMO | Admitting: Family Medicine

## 2016-11-29 ENCOUNTER — Encounter: Payer: Self-pay | Admitting: Family Medicine

## 2016-11-29 DIAGNOSIS — Z0000 Encounter for general adult medical examination without abnormal findings: Secondary | ICD-10-CM

## 2016-11-29 DIAGNOSIS — E890 Postprocedural hypothyroidism: Secondary | ICD-10-CM

## 2016-11-29 DIAGNOSIS — N951 Menopausal and female climacteric states: Secondary | ICD-10-CM | POA: Diagnosis not present

## 2016-11-29 DIAGNOSIS — G4730 Sleep apnea, unspecified: Secondary | ICD-10-CM | POA: Diagnosis not present

## 2016-11-29 DIAGNOSIS — I10 Essential (primary) hypertension: Secondary | ICD-10-CM | POA: Diagnosis not present

## 2016-11-29 DIAGNOSIS — Z9884 Bariatric surgery status: Secondary | ICD-10-CM

## 2016-11-29 LAB — COMPLETE METABOLIC PANEL WITH GFR
ALT: 14 U/L (ref 6–29)
AST: 12 U/L (ref 10–35)
Albumin: 4 g/dL (ref 3.6–5.1)
Alkaline Phosphatase: 77 U/L (ref 33–130)
BUN: 12 mg/dL (ref 7–25)
CHLORIDE: 103 mmol/L (ref 98–110)
CO2: 30 mmol/L (ref 20–31)
Calcium: 9.4 mg/dL (ref 8.6–10.4)
Creat: 0.8 mg/dL (ref 0.50–1.05)
GFR, Est African American: >89 mL/min (ref >=60)
GFR, Est Non African American: 85 mL/min (ref >=60)
GLUCOSE: 89 mg/dL (ref 65–99)
POTASSIUM: 4.1 mmol/L (ref 3.5–5.3)
SODIUM: 140 mmol/L (ref 135–146)
Total Bilirubin: 0.7 mg/dL (ref 0.2–1.2)
Total Protein: 7.2 g/dL (ref 6.1–8.1)

## 2016-11-29 LAB — LIPID PANEL
Cholesterol: 191 mg/dL (ref <200)
HDL: 63 mg/dL (ref >50)
LDL CALC: 114 mg/dL — AB (ref <100)
TRIGLYCERIDES: 68 mg/dL (ref <150)
Total CHOL/HDL Ratio: 3 Ratio (ref <5.0)
VLDL: 14 mg/dL (ref <30)

## 2016-11-29 LAB — POCT GLYCOSYLATED HEMOGLOBIN (HGB A1C): HEMOGLOBIN A1C: 5.4

## 2016-11-29 LAB — CBC
HEMATOCRIT: 40.3 % (ref 35.0–45.0)
HEMOGLOBIN: 13.1 g/dL (ref 11.7–15.5)
MCH: 29.8 pg (ref 27.0–33.0)
MCHC: 32.5 g/dL (ref 32.0–36.0)
MCV: 91.8 fL (ref 80.0–100.0)
MPV: 11.2 fL (ref 7.5–12.5)
Platelets: 276 10*3/uL (ref 140–400)
RBC: 4.39 MIL/uL (ref 3.80–5.10)
RDW: 14 % (ref 11.0–15.0)
WBC: 5.2 10*3/uL (ref 3.8–10.8)

## 2016-11-29 LAB — TSH: TSH: 1.75 m[IU]/L

## 2016-11-29 NOTE — Assessment & Plan Note (Signed)
Obesity is the only major risk factor.  Otherwise very healthy

## 2016-11-29 NOTE — Patient Instructions (Signed)
Keep working on that diet and exercise.   The obesity is your major risk factor.  Everything else seems fine and healthy.   Congrats on the marriage. You should likely check your blood pressure once every month or two just to make sure it is staying under control. I will call with blood work results.

## 2016-11-29 NOTE — Assessment & Plan Note (Addendum)
Last menses was May 2017.  Prior to that was Aug 2016.  Consider endometrial biopsy if any further bleeding.

## 2016-11-29 NOTE — Assessment & Plan Note (Signed)
Wears CPAP nightly 

## 2016-11-30 NOTE — Assessment & Plan Note (Signed)
Screen for DM.  Continue lifestyle modifications.

## 2016-11-30 NOTE — Assessment & Plan Note (Signed)
At risk for iron and B12 deficiencies.  Pay attention to CBC indices.  Hgb and indices normal.  No further work up.

## 2016-11-30 NOTE — Assessment & Plan Note (Signed)
Check tSH.  Has been stable on current dose.

## 2016-11-30 NOTE — Progress Notes (Signed)
   Subjective:    Patient ID: Karsten FellsSabrina Spruell, female    DOB: [birth year], 50 y.o.   MRN: [id number]  HPI  Annual exam Ms Christella NoaSpruell is a healthy woman, who takes an active role in promoting her own health.  She has no bad habits (tobacco, drug, alcohol), eats a healthy diet, exercises regularly.  Her achilles heel is her weight.  She dropped nicely after her gastric bypass surg in 2014 and has since plateaued.  The bypass surgery does put her at risk for several nutritional deficiencies.  Other issues: 1. Hypertension.  Seems to be well controled on current regimen.  Does not check home PB. 2. Sleep apnea, severe.  She wears her CPAP most nights.  Only problem is that she routinely awakens with intestinal bloating/gas. 3. Perimenopausal.  Had LMP ~ 6 months ago.  Period before that was another ~6 months.  Discussed how if further bleeding will likely need endometrial biopsy. 4. Post surgical hypothyroid.  Will need TSH. 5. FHx of DM plus her obesity.  No previous BS elevations.   6. Up to date on screening and adult immunizations.      Review of Systems Denies HA, vision change, skin lumps or lesions, neck swelling, SOB, CP, cough, appetite change, bowel change, bleeding, difficulty urinating, leg swelling or gait disturbance.     Objective:   Physical Exam  HEENT normal Neck supple without masses.  Thyroidectomy scar Lungs clear Cardiac RRR without m or g Abd benign Ext no edema Neuro WNL        Assessment & Plan:

## 2016-12-06 ENCOUNTER — Other Ambulatory Visit: Payer: Self-pay | Admitting: Family Medicine

## 2017-04-09 ENCOUNTER — Encounter (HOSPITAL_COMMUNITY): Payer: Self-pay

## 2017-08-27 ENCOUNTER — Other Ambulatory Visit: Payer: Self-pay | Admitting: Family Medicine

## 2018-01-26 ENCOUNTER — Other Ambulatory Visit: Payer: Self-pay | Admitting: Family Medicine

## 2018-01-26 DIAGNOSIS — I10 Essential (primary) hypertension: Secondary | ICD-10-CM

## 2018-02-10 ENCOUNTER — Ambulatory Visit (HOSPITAL_COMMUNITY)
Admission: EM | Admit: 2018-02-10 | Discharge: 2018-02-10 | Disposition: A | Discharge status: Home / Self Care | Payer: 59 | Attending: Family Medicine | Admitting: Family Medicine

## 2018-02-10 ENCOUNTER — Encounter (HOSPITAL_COMMUNITY): Payer: Self-pay | Admitting: Emergency Medicine

## 2018-02-10 DIAGNOSIS — J0140 Acute pansinusitis, unspecified: Secondary | ICD-10-CM | POA: Diagnosis not present

## 2018-02-10 MED ORDER — AMOXICILLIN-POT CLAVULANATE 875-125 MG PO TABS: 1.0000 | ORAL_TABLET | Freq: Two times a day (BID) | ORAL | 0 refills | Status: DC

## 2018-02-10 MED ORDER — IPRATROPIUM BROMIDE 0.06 % NA SOLN: 2.0000 | Freq: Four times a day (QID) | NASAL | 0 refills | Status: DC

## 2018-02-10 MED ORDER — FLUTICASONE PROPIONATE 50 MCG/ACT NA SUSP: 2.0000 | Freq: Every day | NASAL | 0 refills | Status: DC

## 2018-02-10 NOTE — ED Triage Notes (Signed)
PT reports sinus pain and problems for 2 months.

## 2018-02-10 NOTE — Discharge Instructions (Signed)
Augmentin for sinus infection. Start flonase, atrovent nasal spray for nasal congestion/drainage. Can continue sudafed. You can use over the counter nasal saline rinse such as neti pot for nasal congestion. Keep hydrated, your urine should be clear to pale yellow in color. Tylenol/motrin for fever and pain. Monitor for any worsening of symptoms, chest pain, shortness of breath, wheezing, swelling of the throat, follow up for reevaluation. Follow up with PCP for further evaluation and management needed if symptoms does not resolve.

## 2018-02-10 NOTE — ED Provider Notes (Signed)
[facility]    CSN: [id number] Arrival date & time: 02/10/18  1706     History   Chief Complaint Chief Complaint  Patient presents with  . Facial Pain    HPI Paula Austin is a 52 y.o. female.   52 year old female with history of HLD, HTN, OSA on CPAP, comes in for 67-month history of sinus pain. Rhinorrhea, nasal congestion, sinus pressure. Had some nausea without vomiting today. Also had intermittent dizziness this morning that has since resolved. Denies syncope, one sided weakness, confusion. Denies fever, chills, night sweats. otc sudafed, zyrtec, nasal spray with temporary relief. Never smoker. Denies history of seasonal allergies.       Past Medical History:  Diagnosis Date  . Difficult intubation 1999   "had thick neck" , trouble with intubatuion sept sept 2014 also  . GERD (gastroesophageal reflux disease)    occasional, none lately  . Goiter   . History of kidney stones   . Hyperlipidemia   . Hypertension   . Hypothyroidism   . Morbid obesity (HCC)   . OSA on CPAP    uses cpap some nights, pt does know settings  . PONV (postoperative nausea and vomiting) sept 2014   lots of coughing up phelgm  . Sleep apnea   . Thyroid disease     Patient Active Problem List   Diagnosis Date Noted  . 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 Routine general medical examination at a health care facility 05/22/2013  . Perimenopausal 05/22/2013  . 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 Hypertension 05/02/2011  . Sleep apnea 05/02/2011  . Morbid obesity (HCC) 05/02/2011    Past Surgical History:  Procedure Laterality Date  . BREAST SURGERY Bilateral 11/15/06   Reduction  . BREATH TEK H PYLORI N/A 05/26/2013   Procedure: BREATH TEK H PYLORI;  Surgeon: Valarie Merino, MD;  Location[facility];  Service: General;  Laterality: N/A;  . COLONOSCOPY WITH PROPOFOL N/A 06/17/2014   Procedure: COLONOSCOPY WITH PROPOFOL;  Surgeon: Rachael Fee,  MD;  Location: [facility];  Service: Endoscopy;  Laterality: N/A;  . GASTRIC ROUX-EN-Y N/A 09/07/2013   Procedure: LAPAROSCOPIC ROUX-EN-Y GASTRIC upper endoscopy;  Surgeon: Valarie Merino, MD;  Location: [facility];  Service: General;  Laterality: N/A;  . THYROIDECTOMY  10/30/02  . TUBAL LIGATION  08/25/98   had hard time getting ET tube down per pt.     OB History    No data available       Home Medications    Prior to Admission medications   Medication Sig Start Date End Date Taking? Authorizing Provider  calcium-vitamin D (OSCAL WITH D) 500-200 MG-UNIT per tablet Take 1 tablet by mouth.   Yes [provider]  levothyroxine (SYNTHROID, LEVOTHROID) 125 MCG tablet TAKE ONE TABLET BY MOUTH ONCE DAILY BEFORE BREAKFAST 08/28/17  Yes Hensel, Santiago Bumpers, MD  lisinopril-hydrochlorothiazide (PRINZIDE,ZESTORETIC) 20-25 MG tablet TAKE ONE TABLET BY MOUTH ONCE DAILY 01/27/18  Yes Hensel, Santiago Bumpers, MD  amoxicillin-clavulanate (AUGMENTIN) 875-125 MG tablet Take 1 tablet by mouth every 12 (twelve) hours. 02/10/18   Cathie Hoops, Melenda Bielak V, PA-C  cholecalciferol (VITAMIN D) 1000 UNITS tablet Take 1,000 Units by mouth daily. OTC med     [provider]  cyanocobalamin 1000 MCG tablet Place 100 mcg under the tongue daily.    [provider]  fluticasone (FLONASE) 50 MCG/ACT nasal spray Place 2 sprays into both nostrils daily. 02/10/18   Belinda Fisher,  PA-C  ipratropium (ATROVENT) 0.06 % nasal spray Place 2 sprays into both nostrils 4 (four) times daily. 02/10/18   Belinda Fisher, PA-C    Family History Family History  Problem Relation Age of Onset  . Diabetes Father   . Diabetes Paternal Grandmother   . Cancer Maternal Grandfather        lung  . Colon cancer Neg Hx     Social History Social History   Tobacco Use  . Smoking status: Never Smoker  . Smokeless tobacco: Never Used  Substance Use Topics  . Alcohol use: Yes    Comment: occasional social  . Drug use: No     Allergies     Oxycodone   Review of Systems Review of Systems  Reason unable to perform ROS: See HPI as above.     Physical Exam Triage Vital Signs ED Triage Vitals [02/10/18 1804]  Enc Vitals Group     BP 127/72     Pulse Rate 81     Resp 16     Temp 98.6 F (37 C)     Temp Source Oral     SpO2 100 %     Weight 270 lb (122.5 kg)     Height 5\' 7"  (1.702 m)     Head Circumference      Peak Flow      Pain Score 7     Pain Loc      Pain Edu?      Excl. in GC?    No data found.  Updated Vital Signs BP 127/72   Pulse 81   Temp 98.6 F (37 C) (Oral)   Resp 16   Ht 5\' 7"  (1.702 m)   Wt 270 lb (122.5 kg)   SpO2 100%   BMI 42.29 kg/m   Physical Exam  Constitutional: She is oriented to person, place, and time. She appears well-developed and well-nourished. No distress.  HENT:  Head: Normocephalic and atraumatic.  Right Ear: External ear and ear canal normal. Tympanic membrane is erythematous. Tympanic membrane is not bulging.  Left Ear: External ear and ear canal normal. Tympanic membrane is erythematous. Tympanic membrane is not bulging.  Nose: Mucosal edema present. Right sinus exhibits no maxillary sinus tenderness and no frontal sinus tenderness. Left sinus exhibits no maxillary sinus tenderness and no frontal sinus tenderness.  Mouth/Throat: Uvula is midline, oropharynx is clear and moist and mucous membranes are normal.  Eyes: Conjunctivae are normal. Pupils are equal, round, and reactive to light.  Neck: Normal range of motion. Neck supple.  Cardiovascular: Normal rate, regular rhythm and normal heart sounds. Exam reveals no gallop and no friction rub.  No murmur heard. Pulmonary/Chest: Effort normal and breath sounds normal. She has no decreased breath sounds. She has no wheezes. She has no rhonchi. She has no rales.  Lymphadenopathy:    She has no cervical adenopathy.  Neurological: She is alert and oriented to person, place, and time.  Skin: Skin is warm and dry.   Psychiatric: She has a normal mood and affect. Her behavior is normal. Judgment normal.    UC Treatments / Results  Labs (all labs ordered are listed, but only abnormal results are displayed) Labs Reviewed - No data to display  EKG  EKG Interpretation None       Radiology No results found.  Procedures Procedures (including critical care time)  Medications Ordered in UC Medications - No data to display   Initial Impression / Assessment and  Plan / UC Course  I have reviewed the triage vital signs and the nursing notes.  Pertinent labs & imaging results that were available during my care of the patient were reviewed by me and considered in my medical decision making (see chart for details).    Augmentin for sinusitis.  Other symptomatic treatment discussed.  Push fluids.  Return precautions given.  Patient expresses understanding and agrees to plan.  Final Clinical Impressions(s) / UC Diagnoses   Final diagnoses:  Acute non-recurrent pansinusitis    ED Discharge Orders        Ordered    amoxicillin-clavulanate (AUGMENTIN) 875-125 MG tablet  Every 12 hours     02/10/18 1842    fluticasone (FLONASE) 50 MCG/ACT nasal spray  Daily     02/10/18 1842    ipratropium (ATROVENT) 0.06 % nasal spray  4 times daily     02/10/18 1842        Belinda FisherYu, Cherlyn Syring V, PA-C 02/10/18 1846

## 2018-04-10 ENCOUNTER — Encounter (HOSPITAL_COMMUNITY): Payer: Self-pay

## 2018-04-21 ENCOUNTER — Other Ambulatory Visit: Payer: Self-pay

## 2018-04-21 ENCOUNTER — Encounter: Payer: Self-pay | Admitting: Family Medicine

## 2018-04-21 ENCOUNTER — Ambulatory Visit (INDEPENDENT_AMBULATORY_CARE_PROVIDER_SITE_OTHER): Payer: 59 | Admitting: Family Medicine

## 2018-04-21 VITALS — BP 106/76 | HR 75 | Temp 98.4°F | Ht 68.0 in | Wt 295.2 lb

## 2018-04-21 DIAGNOSIS — I10 Essential (primary) hypertension: Secondary | ICD-10-CM | POA: Diagnosis not present

## 2018-04-21 DIAGNOSIS — E039 Hypothyroidism, unspecified: Secondary | ICD-10-CM | POA: Diagnosis not present

## 2018-04-21 DIAGNOSIS — F4322 Adjustment disorder with anxiety: Secondary | ICD-10-CM | POA: Insufficient documentation

## 2018-04-21 DIAGNOSIS — E890 Postprocedural hypothyroidism: Secondary | ICD-10-CM | POA: Diagnosis not present

## 2018-04-21 NOTE — Patient Instructions (Signed)
Thank you for coming in to see [place] today. Please see below to review our plan for today's visit.  1.  Your symptoms seem to be consistent with anxiety.  I encourage you to continue walking outside and finding other forms of activity to help clear your mind during finals week. 2.  You will receive a letter in the mail with your lab results.  I will call you if results are abnormal. 3.  We will see you with your next schedule appointment on the 15th.  Please call the clinic at [phone] if your symptoms worsen or you have any concerns. It was our pleasure to serve you.  Durward Parcel, DO [facility], PGY-2

## 2018-04-21 NOTE — Assessment & Plan Note (Signed)
Symptoms consistent with anxiety.  Likely related to finals.  Does have history of hypothyroidism and family history of diabetes.  Will screen for these along with electrolytes. - Checking CMET, TSH, A1c - Follow-up with PCP on 04/30/2018

## 2018-04-21 NOTE — Progress Notes (Signed)
   Subjective   Patient ID: Paula Austin    DOB: January 11, 1966, 52 y.o. female   MRN: [id number]  CC: " Numbness"  HPI: [name] is a 52 y.o. female who presents for a same day appointment for the following:  Numbness: Patient endorsing sensation consistent with eyes running through veins over the past week.  Sensation seems to be constant.  Onset around Friday night while patient has been starting for finals this week.  She also endorses some associated facial flushing and tingling of the hands and feet.  She endorses similar symptoms in the past which improved with vitamin B use.  She has not tried this yet.  She denies chest pain, shortness of breath, vomiting or diarrhea, loss of motor function, slurring of speech.  She feels that her symptoms are related to anxiety but wanted to be evaluated to make sure it was not anything serious.  ROS: see HPI for pertinent.  [place]: Morbid obesity, HTN, OSA, hypothyroidism.  Surgical history Roux-en-Y (2014), thyroidectomy, tubal, bilateral breast reduction.  Family history DM.  Smoking status reviewed. Medications reviewed.  Objective   BP 106/76   Pulse 75   Temp 98.4 F (36.9 C) (Oral)   Ht _0  (1.727 m)   Wt 295 lb 3.2 oz (133.9 kg)   SpO2 96%   BMI 44.89 kg/m  Vitals and nursing note reviewed.  General: obese, calm, non tremulous, well nourished, well developed, NAD with non-toxic appearance HEENT: normocephalic, atraumatic, moist mucous membranes Neck: supple, non-tender without lymphadenopathy Cardiovascular: regular rate and rhythm without murmurs, rubs, or gallops Lungs: clear to auscultation bilaterally with normal work of breathing Abdomen: soft, non-tender, non-distended, normoactive bowel sounds Skin: warm, dry, no rashes or lesions, cap refill < 2 seconds Extremities: warm and well perfused, normal tone, no edema  Assessment & Plan   Acute adjustment disorder with anxiety Symptoms consistent with anxiety.   Likely related to finals.  Does have history of hypothyroidism and family history of diabetes.  Will screen for these along with electrolytes. - Checking CMET, TSH, A1c - Follow-up with PCP on 04/30/2018  Orders Placed This Encounter  Procedures  . CMP14+EGFR  . TSH   No orders of the defined types were placed in this encounter.   Harriet Butte, [place], PGY-2 04/22/2018, 9:41 AM

## 2018-04-22 ENCOUNTER — Encounter: Payer: Self-pay | Admitting: Family Medicine

## 2018-04-22 LAB — CMP14+EGFR
A/G RATIO: 1.4 (ref 1.2–2.2)
ALK PHOS: 97 IU/L (ref 39–117)
ALT: 18 IU/L (ref 0–32)
AST: 20 IU/L (ref 0–40)
Albumin: 4.2 g/dL (ref 3.5–5.5)
BUN/Creatinine Ratio: 16 (ref 9–23)
BUN: 12 mg/dL (ref 6–24)
Bilirubin Total: 0.4 mg/dL (ref 0.0–1.2)
CO2: 24 mmol/L (ref 20–29)
Calcium: 9.3 mg/dL (ref 8.7–10.2)
Chloride: 100 mmol/L (ref 96–106)
Creatinine, Ser: 0.73 mg/dL (ref 0.57–1.00)
GFR calc Af Amer: 109 mL/min/{1.73_m2} (ref >59)
GFR, EST NON AFRICAN AMERICAN: 94 mL/min/{1.73_m2} (ref >59)
GLOBULIN, TOTAL: 3.1 g/dL (ref 1.5–4.5)
Glucose: 87 mg/dL (ref 65–99)
POTASSIUM: 3.9 mmol/L (ref 3.5–5.2)
SODIUM: 139 mmol/L (ref 134–144)
Total Protein: 7.3 g/dL (ref 6.0–8.5)

## 2018-04-22 LAB — TSH: TSH: 2.48 u[IU]/mL (ref 0.450–4.500)

## 2018-04-30 ENCOUNTER — Ambulatory Visit (INDEPENDENT_AMBULATORY_CARE_PROVIDER_SITE_OTHER): Payer: 59 | Admitting: Family Medicine

## 2018-04-30 ENCOUNTER — Other Ambulatory Visit: Payer: Self-pay

## 2018-04-30 ENCOUNTER — Encounter: Payer: Self-pay | Admitting: Family Medicine

## 2018-04-30 DIAGNOSIS — Z9884 Bariatric surgery status: Secondary | ICD-10-CM

## 2018-04-30 DIAGNOSIS — Z0000 Encounter for general adult medical examination without abnormal findings: Secondary | ICD-10-CM

## 2018-04-30 DIAGNOSIS — I10 Essential (primary) hypertension: Secondary | ICD-10-CM

## 2018-04-30 NOTE — Patient Instructions (Signed)
You look great. Your Achilles heel is the weight.  I am glad you are working on diet and exercise. Come in 2-3 months for fasting blood work.  The orders are already in. I will call a few days after the blood is drawn.

## 2018-04-30 NOTE — Assessment & Plan Note (Addendum)
Well-controlled. Continue current medications. -CMP completed on 04/21/18 and results all WNL; will check lipid panel in 3 months Good control.

## 2018-04-30 NOTE — Progress Notes (Signed)
Date of Visit: 04/30/2018   HPI:  Patient presents today for a well woman exam.   Concerns today: none Periods: Post-menopausal Sexual activity: Sexually active with husband Pap smear status: Up to date  Exercise: -Started new job where she sits all day versus being on her feet all day like her previous jobs -Over past few weeks has started jump roping since this is something she can do at home, even when she is busy; this has been easier than patient expected and she feels very motivated to stay with it - Has also started taking 4-5 mile walks in the park on the weekend and is motivated to keep doing this because it helps with her stress level in addition to being good exercise  Diet: - Admits that her diet "could be way better"; has become very motivated to eat healthier since office visit last week that revealed weight gain - Has been eating more late-night meals since marriage in June 2017 since husband eats late at night and sometimes brings her food that she does not turn down - Has made small changes to improve diet over past 2 weeks including eating fewer biscuits for breakfast, opting for eggs w/ grits instead - Drinks 12-16 ounces of sweet tea per day; has been trying to drink more water, less sweet tea  Smoking: non-smoker Alcohol: very little EtOH intake; occasional glass of wine in evening Drugs: none  Mood: - Saw Dr. Abelardo Diesel on 04/21/18 for tingling/numbness in hands and feet which was attributed to stress/anxiety - Since then, she has been feeling much less stressed and has no recurrence of these symptoms   ROS: See HPI  PMHx: Hypothyroidism, HTN, gastric bypass surgery (Sept 2014)  Family Hx: - Father and grandmother w/ diabetes mellitus  PHYSICAL EXAM: BP 116/72   Pulse 85   Temp 98.4 F (36.9 C) (Oral)   Ht  (1.727 m)   Wt 296 lb 6.4 oz (134.4 kg)   LMP 04/29/2016   SpO2 99%   BMI 45.07 kg/m    Gen: NAD, pleasant, cooperative HEENT: NCAT, no  cervical lymphadenopathy Heart: RRR, no murmurs; 2+ radial pulses bilaterally Lungs: CTAB, NWOB Neuro: grossly nonfocal, speech normal Ext: 1+ pretibial pitting edema bilaterally   ASSESSMENT/PLAN:  Hypertension Well-controlled. Continue current medications. -CMP completed on 04/21/18 and results all WNL; will check lipid panel in 3 months  Health maintenance: -Pap smear: up to date; last Pap smear + HPV on 07/27/15 -Mammogram: signed release of information today so results from 01/01/18 mammogram can be obtained -Lipid screening: will check lipid panel in 3 months, after allowing time for patient to make dietary/lifestyle changes -Anemia screening: will check CBC and ferritin level when check lipids given history of gastric bypass and increased risk of iron deficiency anemia -Diabetes screening/prevention:    -CMP completed on 04/21/18; glucose normal    -Motivational interviewing used to encourage better dietary and exercise habits  FOLLOW UP: Return to clinic for blood work in 3 months. (Lab visit only.)

## 2018-05-01 ENCOUNTER — Encounter: Payer: Self-pay | Admitting: Family Medicine

## 2018-05-01 NOTE — Assessment & Plan Note (Signed)
Needs CBC and ferritin.

## 2018-05-01 NOTE — Assessment & Plan Note (Signed)
She is healthy except for weight.

## 2018-05-01 NOTE — Assessment & Plan Note (Signed)
She is actively engaged in diet and exercise.

## 2018-05-06 ENCOUNTER — Encounter: Payer: Self-pay | Admitting: Family Medicine

## 2018-08-22 ENCOUNTER — Ambulatory Visit (INDEPENDENT_AMBULATORY_CARE_PROVIDER_SITE_OTHER): Payer: 59

## 2018-08-22 ENCOUNTER — Ambulatory Visit (HOSPITAL_COMMUNITY)
Admission: EM | Admit: 2018-08-22 | Discharge: 2018-08-22 | Disposition: A | Discharge status: Home / Self Care | Payer: 59 | Attending: Internal Medicine | Admitting: Internal Medicine

## 2018-08-22 ENCOUNTER — Encounter (HOSPITAL_COMMUNITY): Payer: Self-pay

## 2018-08-22 DIAGNOSIS — Z79899 Other long term (current) drug therapy: Secondary | ICD-10-CM | POA: Insufficient documentation

## 2018-08-22 DIAGNOSIS — R1031 Right lower quadrant pain: Secondary | ICD-10-CM

## 2018-08-22 DIAGNOSIS — R0789 Other chest pain: Secondary | ICD-10-CM

## 2018-08-22 DIAGNOSIS — K219 Gastro-esophageal reflux disease without esophagitis: Secondary | ICD-10-CM | POA: Insufficient documentation

## 2018-08-22 DIAGNOSIS — R109 Unspecified abdominal pain: Secondary | ICD-10-CM | POA: Diagnosis present

## 2018-08-22 LAB — CBC
HCT: 41.9 % (ref 36.0–46.0)
Hemoglobin: 13.3 g/dL (ref 12.0–15.0)
MCH: 29.8 pg (ref 26.0–34.0)
MCHC: 31.7 g/dL (ref 30.0–36.0)
MCV: 93.7 fL (ref 78.0–100.0)
Platelets: 267 10*3/uL (ref 150–400)
RBC: 4.47 MIL/uL (ref 3.87–5.11)
RDW: 13.2 % (ref 11.5–15.5)
WBC: 7.2 10*3/uL (ref 4.0–10.5)

## 2018-08-22 LAB — COMPREHENSIVE METABOLIC PANEL
ALT: 17 U/L (ref 0–44)
AST: 17 U/L (ref 15–41)
Albumin: 3.8 g/dL (ref 3.5–5.0)
Alkaline Phosphatase: 87 U/L (ref 38–126)
Anion gap: 9 (ref 5–15)
BUN: 14 mg/dL (ref 6–20)
CHLORIDE: 102 mmol/L (ref 98–111)
CO2: 30 mmol/L (ref 22–32)
Calcium: 9.6 mg/dL (ref 8.9–10.3)
Creatinine, Ser: 0.89 mg/dL (ref 0.44–1.00)
Glucose, Bld: 110 mg/dL — ABNORMAL HIGH (ref 70–99)
Potassium: 3.3 mmol/L — ABNORMAL LOW (ref 3.5–5.1)
SODIUM: 141 mmol/L (ref 135–145)
Total Bilirubin: 0.4 mg/dL (ref 0.3–1.2)
Total Protein: 7.8 g/dL (ref 6.5–8.1)

## 2018-08-22 LAB — LIPASE, BLOOD: Lipase: 87 U/L — ABNORMAL HIGH (ref 11–51)

## 2018-08-22 MED ORDER — GI COCKTAIL ~~LOC~~
30.0000 mL | Freq: Once | ORAL | Status: AC
  Administered 2018-08-22: 30 mL via ORAL

## 2018-08-22 MED ORDER — GI COCKTAIL ~~LOC~~
ORAL | Status: AC
  Filled 2018-08-22: qty 30

## 2018-08-22 MED ORDER — OMEPRAZOLE 20 MG PO CPDR: 20.0000 mg | DELAYED_RELEASE_CAPSULE | Freq: Every day | ORAL | 0 refills | Status: DC

## 2018-08-22 NOTE — Discharge Instructions (Signed)
Please begin 2-week trial of Prilosec daily to help prevent your reflux Please increase your fluid intake, drinking at least 64 ounces of water a day, increasing fiber in diet, increasing exercise/activity to help with constipation  Please continue to monitor your pain, return if symptoms worsening, developing fever, nausea, vomiting, diarrhea, worsening symptoms.

## 2018-08-22 NOTE — ED Triage Notes (Signed)
Pt presents with complaints of lower abdominal pain and constipation and worsening acid reflux. Reports feeling better than she was.

## 2018-08-23 NOTE — ED Provider Notes (Signed)
[facility]    CSN: [id number] Arrival date & time: 08/22/18  1651     History   Chief Complaint Chief Complaint  Patient presents with  . Abdominal Pain    HPI Paula Austin is a 52 y.o. female history of hypertension, hyperlipidemia, hypothyroid presenting today for evaluation of abdominal pain.  Patient states that 2 weeks ago she had a sharp pain in her right side, this resolved with ibuprofen, over the past week she has had worsening indigestion, feeling a discomfort in bilateral breasts as well as in her back.  Typically when this happens it is because she has not been taking her Synthroid, she began taking her Synthroid but her symptoms did not improve.  She began to drink water, and took Zantac for 3 to 4 days, feels as if the indigestion has eased off, but is still present.  Denies any nausea or vomiting.  Feels symptoms worsen after eating.  She also notes a pain in her right lower quadrant that is been present this week as well, feels this is slightly eased off in the past day or 2.  She denies any changes in bowel movements, states that she typically struggles with constipation.  She did notice a dark bowel movement recently, but denies any blood.  Has not felt lightheaded or dizzy.  Denies any shortness of breath or coughing.  Eating and drinking like normal.  Patient had gastric bypass in 2014.  HPI  Past Medical History:  Diagnosis Date  . Difficult intubation 1999   "had thick neck" , trouble with intubatuion sept sept 2014 also  . GERD (gastroesophageal reflux disease)    occasional, none lately  . Goiter   . History of kidney stones   . Hyperlipidemia   . Hypertension   . Hypothyroidism   . Morbid obesity (HCC)   . OSA on CPAP    uses cpap some nights, pt does know settings  . PONV (postoperative nausea and vomiting) sept 2014   lots of coughing up phelgm  . Sleep apnea   . Thyroid disease     Patient Active Problem List   Diagnosis Date Noted   . Acute adjustment disorder with anxiety 04/21/2018  . 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 Routine general medical examination at a health care facility 05/22/2013  . Perimenopausal 05/22/2013  . 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 Hypertension 05/02/2011  . Sleep apnea 05/02/2011  . Morbid obesity (HCC) 05/02/2011    Past Surgical History:  Procedure Laterality Date  . BREAST SURGERY Bilateral 11/15/06   Reduction  . BREATH TEK H PYLORI N/A 05/26/2013   Procedure: BREATH TEK H PYLORI;  Surgeon: Valarie Merino, MD;  Location: [facility];  Service: General;  Laterality: N/A;  . COLONOSCOPY WITH PROPOFOL N/A 06/17/2014   Procedure: COLONOSCOPY WITH PROPOFOL;  Surgeon: Rachael Fee, MD;  Location: [facility];  Service: Endoscopy;  Laterality: N/A;  . GASTRIC ROUX-EN-Y N/A 09/07/2013   Procedure: LAPAROSCOPIC ROUX-EN-Y GASTRIC upper endoscopy;  Surgeon: Valarie Merino, MD;  Location: [facility];  Service: General;  Laterality: N/A;  . THYROIDECTOMY  10/30/02  . TUBAL LIGATION  08/25/98   had hard time getting ET tube down per pt.     OB History   None      Home Medications    Prior to Admission medications   Medication Sig Start Date End Date Taking? Authorizing Provider  cyanocobalamin 1000 MCG tablet Place 100 mcg  under the tongue daily.    [provider]  fluticasone (FLONASE) 50 MCG/ACT nasal spray Place 2 sprays into both nostrils daily. 02/10/18   Cathie Hoops, Amy V, PA-C  ipratropium (ATROVENT) 0.06 % nasal spray Place 2 sprays into both nostrils 4 (four) times daily. 02/10/18   Belinda Fisher, PA-C  levothyroxine (SYNTHROID, LEVOTHROID) 125 MCG tablet TAKE ONE TABLET BY MOUTH ONCE DAILY BEFORE BREAKFAST 08/28/17   Moses Manners, MD  lisinopril-hydrochlorothiazide (PRINZIDE,ZESTORETIC) 20-25 MG tablet TAKE ONE TABLET BY MOUTH ONCE DAILY 01/27/18   Moses Manners, MD  omeprazole (PRILOSEC) 20 MG capsule Take 1 capsule (20 mg total) by mouth daily for  14 days. 08/22/18 09/05/18  Wieters, Junius Creamer, PA-C    Family History Family History  Problem Relation Age of Onset  . Diabetes Father   . Diabetes Paternal Grandmother   . Cancer Maternal Grandfather        lung  . Colon cancer Neg Hx     Social History Social History   Tobacco Use  . Smoking status: Never Smoker  . Smokeless tobacco: Never Used  Substance Use Topics  . Alcohol use: Yes    Comment: occasional social  . Drug use: No     Allergies   Oxycodone   Review of Systems Review of Systems  Constitutional: Negative for activity change, appetite change and fever.  Respiratory: Negative for cough and shortness of breath.   Cardiovascular: Negative for chest pain.  Gastrointestinal: Positive for abdominal pain and constipation. Negative for blood in stool, diarrhea, nausea and vomiting.  Genitourinary: Negative for dysuria, flank pain, genital sores, hematuria, menstrual problem, vaginal bleeding, vaginal discharge and vaginal pain.  Musculoskeletal: Negative for back pain.  Skin: Negative for rash.  Neurological: Negative for dizziness, light-headedness and headaches.     Physical Exam Triage Vital Signs ED Triage Vitals [08/22/18 1809]  Enc Vitals Group     BP (!) 150/95     Pulse Rate 65     Resp 18     Temp 98 F (36.7 C)     Temp src      SpO2 100 %     Weight      Height      Head Circumference      Peak Flow      Pain Score 2     Pain Loc      Pain Edu?      Excl. in GC?    No data found.  Updated Vital Signs BP (!) 150/95   Pulse 65   Temp 98 F (36.7 C)   Resp 18   LMP 04/29/2016   SpO2 100%   Visual Acuity Right Eye Distance:   Left Eye Distance:   Bilateral Distance:    Right Eye Near:   Left Eye Near:    Bilateral Near:     Physical Exam  Constitutional: She appears well-developed and well-nourished. No distress.  Overweight female, sitting comfortably in chair, easily ambulates to exam table  HENT:  Head:  Normocephalic and atraumatic.  Oral mucosa pink and moist, no tonsillar enlargement or exudate. Posterior pharynx patent and nonerythematous, no uvula deviation or swelling. Normal phonation.  Eyes: Conjunctivae are normal.  Neck: Neck supple.  Cardiovascular: Normal rate and regular rhythm.  No murmur heard. Pulmonary/Chest: Effort normal and breath sounds normal. No respiratory distress.  Breathing comfortably at rest, CTABL, no wheezing, rales or other adventitious sounds auscultated; lung sounds diffusely decreased   Abdominal:  Soft. There is no tenderness.  Abdomen soft, nondistended, tenderness to palpation of right lower quadrant, negative Rovsing, negative rebound, negative Murphy's  Musculoskeletal: She exhibits no edema.  Neurological: She is alert.  Skin: Skin is warm and dry.  Psychiatric: She has a normal mood and affect.  Nursing note and vitals reviewed.    UC Treatments / Results  Labs (all labs ordered are listed, but only abnormal results are displayed) Labs Reviewed  COMPREHENSIVE METABOLIC PANEL - Abnormal; Notable for the following components:      Result Value   Potassium 3.3 (*)    Glucose, Bld 110 (*)    All other components within normal limits  LIPASE, BLOOD - Abnormal; Notable for the following components:   Lipase 87 (*)    All other components within normal limits  CBC    EKG None  Radiology Dg Abd 2 Views  Result Date: 08/22/2018 CLINICAL DATA:  Patient states that she has been having RLQ pain x two weeks with constipation and nausea. Hx of gastric bypass and tubaligation EXAM: ABDOMEN - 2 VIEW COMPARISON:  Postoperative upper GI examination from 09/08/2013 FINDINGS: Unremarkable bowel gas pattern, with formed stool in the colon. No dilated bowel. No significant abnormal calcifications are identified. IMPRESSION: 1. Overall unremarkable bowel gas pattern. Electronically Signed   By: Gaylyn Rong M.D.   On: 08/22/2018 20:04     Procedures Procedures (including critical care time)  Medications Ordered in UC Medications  gi cocktail (Maalox,Lidocaine,Donnatal) (30 mLs Oral Given 08/22/18 1941)    Initial Impression / Assessment and Plan / UC Course  I have reviewed the triage vital signs and the nursing notes.  Pertinent labs & imaging results that were available during my care of the patient were reviewed by me and considered in my medical decision making (see chart for details).     EKG normal sinus rhythm, no acute signs of ischemia or infarction, very similar to previous EKG, EKG also reviewed by Dr. Dayton Scrape.  Patient provided with GI cocktail which significantly improved her discomfort in the upper part of her chest/back.  She felt much improved after taking this.  Abdominal x-ray showing stool in the left side of colon, but no significant constipation or abnormal gas pattern.  Labs obtained, CBC, CMP and lipase all normal.  Discussed with patient persisting with treatment for reflux, beginning Prilosec daily as well as lifestyle modifications to help with constipation.  Monitoring pain in right lower quadrant, less likely appendicitis given length of symptoms as well as slight improvement.  Discussed possible pelvic etiology with patient.Discussed strict return precautions. Patient verbalized understanding and is agreeable with plan.  Final Clinical Impressions(s) / UC Diagnoses   Final diagnoses:  Right lower quadrant abdominal pain  Gastroesophageal reflux disease, esophagitis presence not specified     Discharge Instructions     Please begin 2-week trial of Prilosec daily to help prevent your reflux Please increase your fluid intake, drinking at least 64 ounces of water a day, increasing fiber in diet, increasing exercise/activity to help with constipation  Please continue to monitor your pain, return if symptoms worsening, developing fever, nausea, vomiting, diarrhea, worsening symptoms.    ED  Prescriptions    Medication Sig Dispense Auth. Provider   omeprazole (PRILOSEC) 20 MG capsule Take 1 capsule (20 mg total) by mouth daily for 14 days. 30 capsule Wieters, Hallie C, PA-C     Controlled Substance Prescriptions [place] Controlled Substance Registry consulted? Not Applicable   Sharyon Cable, [place]  C, PA-C 08/23/18 1610

## 2018-08-25 ENCOUNTER — Ambulatory Visit (INDEPENDENT_AMBULATORY_CARE_PROVIDER_SITE_OTHER): Payer: 59 | Admitting: Student in an Organized Health Care Education/Training Program

## 2018-08-25 ENCOUNTER — Other Ambulatory Visit: Payer: Self-pay

## 2018-08-25 DIAGNOSIS — R1031 Right lower quadrant pain: Secondary | ICD-10-CM

## 2018-08-25 DIAGNOSIS — R109 Unspecified abdominal pain: Secondary | ICD-10-CM | POA: Insufficient documentation

## 2018-08-25 NOTE — Progress Notes (Signed)
Subjective:    Paula Austin - 52 y.o. female MRN [id number]  Date of birth: Jul 16, 1966  HPI  Paula Austin is here for abdominal pain.  Patient was previously seen in urgent care on 9/6 and prescribed trial of prilosec for reflux symptoms.  Did not take prilosec but GERD resolving.  Today her primary concern is RLQ soreness. Worse with walking. 3/10 non-radiating pain, described as cramping and soreness.  She has never had similar symptoms. No history of trauma. Pai ncame on gradually. - no nausea - no vomiting - no diarrhea - +constipation earlier this week, last BM this AM was normal. Prior to this morning she feels she has had BMs less frequently than usual - dark stools, no hematochezia - dysuria - vaginal discharge or bleeding - hematuria -  No fevers - she reports she is perimenopausal and last period waws 05/2018  Health Maintenance:  Health Maintenance Due  Topic Date Due  . INFLUENZA VACCINE  07/17/2018  . PAP SMEAR  07/26/2018    -  reports that she has never smoked. She has never used smokeless tobacco. - Review of Systems: Per HPI. - Past Medical History: Patient Active Problem List   Diagnosis Date Noted  . Abdominal pain 08/25/2018  . Acute adjustment disorder with anxiety 04/21/2018  . 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 Routine general medical examination at a health care facility 05/22/2013  . Perimenopausal 05/22/2013  . 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 Hypertension 05/02/2011  . Sleep apnea 05/02/2011  . Morbid obesity (HCC) 05/02/2011   - Medications: reviewed and updated Current Outpatient Medications  Medication Sig Dispense Refill  . cyanocobalamin 1000 MCG tablet Place 100 mcg under the tongue daily.    . fluticasone (FLONASE) 50 MCG/ACT nasal spray Place 2 sprays into both nostrils daily. 1 g 0  . ipratropium (ATROVENT) 0.06 % nasal spray Place 2 sprays into both nostrils 4 (four) times daily. 15 mL 0  .  levothyroxine (SYNTHROID, LEVOTHROID) 125 MCG tablet TAKE ONE TABLET BY MOUTH ONCE DAILY BEFORE BREAKFAST 90 tablet 3  . lisinopril-hydrochlorothiazide (PRINZIDE,ZESTORETIC) 20-25 MG tablet TAKE ONE TABLET BY MOUTH ONCE DAILY 90 tablet 3  . omeprazole (PRILOSEC) 20 MG capsule Take 1 capsule (20 mg total) by mouth daily for 14 days. 30 capsule 0   No current facility-administered medications for this visit.     Review of Systems See HPI     Objective:   Physical Exam BP 110/60   Pulse 79   Temp 98.5 F (36.9 C) (Oral)   Ht 5\' 8"  (1.727 m)   Wt 296 lb (134.3 kg)   LMP 04/29/2016   SpO2 98%   BMI 45.01 kg/m  Gen: NAD, alert, cooperative with exam, well-appearing, comfortable and pleasant Resp: comfortable work of breathing Card: regular rate noted Abd: soft, mildly tender to deep palpation in the right lower quadrant. Normoactive BS. Negative murphy. No organomegaly. No rebound tenderness or guarding. RLQ pain worse with going from lying to sitting with palpation of the abdominal wall musculature. Skin: no rashes, normal turgor  Neuro: no gross deficits.  Psych: good insight, alert and oriented     Assessment & Plan:   1. Right lower quadrant abdominal pain History and exam most consistent with MSK abdominal wall pain. No red flag symptoms at this time and patient is very well-appearing.  She has to limit ibuprofen due to history of reflux. Would use heating pad, tylenol, and only occasional  ibuprofen with food if needed. Return precautions were discussed.    Howard Pouch, MD,MS,  PGY3 08/25/2018 9:05 AM

## 2018-09-22 ENCOUNTER — Emergency Department (HOSPITAL_COMMUNITY): Payer: 59

## 2018-09-22 ENCOUNTER — Emergency Department (HOSPITAL_COMMUNITY)
Admission: EM | Admit: 2018-09-22 | Discharge: 2018-09-22 | Disposition: A | Discharge status: Home / Self Care | Payer: 59 | Attending: Emergency Medicine | Admitting: Emergency Medicine

## 2018-09-22 ENCOUNTER — Encounter (HOSPITAL_COMMUNITY): Payer: Self-pay | Admitting: Emergency Medicine

## 2018-09-22 ENCOUNTER — Other Ambulatory Visit: Payer: Self-pay

## 2018-09-22 DIAGNOSIS — I10 Essential (primary) hypertension: Secondary | ICD-10-CM | POA: Diagnosis not present

## 2018-09-22 DIAGNOSIS — Z79899 Other long term (current) drug therapy: Secondary | ICD-10-CM | POA: Diagnosis not present

## 2018-09-22 DIAGNOSIS — R0602 Shortness of breath: Secondary | ICD-10-CM | POA: Diagnosis not present

## 2018-09-22 LAB — CBC
HEMATOCRIT: 38.2 % (ref 36.0–46.0)
HEMOGLOBIN: 12.2 g/dL (ref 12.0–15.0)
MCH: 30 pg (ref 26.0–34.0)
MCHC: 31.9 g/dL (ref 30.0–36.0)
MCV: 93.9 fL (ref 78.0–100.0)
Platelets: 231 10*3/uL (ref 150–400)
RBC: 4.07 MIL/uL (ref 3.87–5.11)
RDW: 13.2 % (ref 11.5–15.5)
WBC: 7.7 10*3/uL (ref 4.0–10.5)

## 2018-09-22 LAB — BASIC METABOLIC PANEL
ANION GAP: 7 (ref 5–15)
BUN: 8 mg/dL (ref 6–20)
CHLORIDE: 106 mmol/L (ref 98–111)
CO2: 26 mmol/L (ref 22–32)
Calcium: 8.9 mg/dL (ref 8.9–10.3)
Creatinine, Ser: 0.84 mg/dL (ref 0.44–1.00)
GFR calc Af Amer: >60 mL/min (ref >60)
GFR calc non Af Amer: >60 mL/min (ref >60)
GLUCOSE: 102 mg/dL — AB (ref 70–99)
POTASSIUM: 3.8 mmol/L (ref 3.5–5.1)
Sodium: 139 mmol/L (ref 135–145)

## 2018-09-22 LAB — I-STAT TROPONIN, ED: Troponin i, poc: 0 ng/mL (ref 0.00–0.08)

## 2018-09-22 LAB — I-STAT BETA HCG BLOOD, ED (MC, WL, AP ONLY): I-stat hCG, quantitative: <5 m[IU]/mL (ref <5)

## 2018-09-22 NOTE — ED Triage Notes (Signed)
Pt reports SOB onset 1hr PTA. Pt states she woke up from her sleep unable to catch her breath. Clear lung sounds noted, 100% RA. Denies CP.

## 2018-09-22 NOTE — ED Provider Notes (Signed)
[facility] EMERGENCY DEPARTMENT Provider Note   CSN: [id number] Arrival date & time: 09/22/18  0246     History   Chief Complaint Chief Complaint  Patient presents with  . Shortness of Breath    HPI Paula Austin is a 52 y.o. female.  Patient is a 52 year old female with past medical history of morbid obesity, hypertension, GERD, and obstructive sleep apnea.  She presents today with complaints of shortness of breath.  She woke once in the night with strange dreams, then went back to sleep.  She woke up shortly after feeling as if she could not breathe.  She was originally gasping for air and felt very shaky.  She denies any recent cough, fevers, or chills.  She denies any chest pain or difficulty breathing.  Symptoms are gradually improving.  The history is provided by the patient.  Shortness of Breath  This is a new problem. The average episode lasts 1 hour. The problem occurs continuously.The problem has been gradually improving. She has tried nothing for the symptoms.    Past Medical History:  Diagnosis Date  . Difficult intubation 1999   "had thick neck" , trouble with intubatuion sept sept 2014 also  . GERD (gastroesophageal reflux disease)    occasional, none lately  . Goiter   . History of kidney stones   . Hyperlipidemia   . Hypertension   . Hypothyroidism   . Morbid obesity (HCC)   . OSA on CPAP    uses cpap some nights, pt does know settings  . PONV (postoperative nausea and vomiting) sept 2014   lots of coughing up phelgm  . Sleep apnea   . Thyroid disease     Patient Active Problem List   Diagnosis Date Noted  . Abdominal pain 08/25/2018  . Acute adjustment disorder with anxiety 04/21/2018  . 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 Routine general medical examination at a health care facility 05/22/2013  . Perimenopausal 05/22/2013  . 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 Hypertension 05/02/2011  . Sleep apnea  05/02/2011  . Morbid obesity (HCC) 05/02/2011    Past Surgical History:  Procedure Laterality Date  . BREAST SURGERY Bilateral 11/15/06   Reduction  . BREATH TEK H PYLORI N/A 05/26/2013   Procedure: BREATH TEK H PYLORI;  Surgeon: Valarie Merino, MD;  Location: [facility];  Service: General;  Laterality: N/A;  . COLONOSCOPY WITH PROPOFOL N/A 06/17/2014   Procedure: COLONOSCOPY WITH PROPOFOL;  Surgeon: Rachael Fee, MD;  Location: [facility];  Service: Endoscopy;  Laterality: N/A;  . GASTRIC ROUX-EN-Y N/A 09/07/2013   Procedure: LAPAROSCOPIC ROUX-EN-Y GASTRIC upper endoscopy;  Surgeon: Valarie Merino, MD;  Location: [facility];  Service: General;  Laterality: N/A;  . THYROIDECTOMY  10/30/02  . TUBAL LIGATION  08/25/98   had hard time getting ET tube down per pt.      OB History   None      Home Medications    Prior to Admission medications   Medication Sig Start Date End Date Taking? Authorizing Provider  cyanocobalamin 1000 MCG tablet Place 100 mcg under the tongue daily.    [provider]  fluticasone (FLONASE) 50 MCG/ACT nasal spray Place 2 sprays into both nostrils daily. 02/10/18   Cathie Hoops, Amy V, PA-C  ipratropium (ATROVENT) 0.06 % nasal spray Place 2 sprays into both nostrils 4 (four) times daily. 02/10/18   Cathie Hoops, Amy V, PA-C  levothyroxine (SYNTHROID, LEVOTHROID) 125 MCG tablet  TAKE ONE TABLET BY MOUTH ONCE DAILY BEFORE BREAKFAST 08/28/17   Moses Manners, MD  lisinopril-hydrochlorothiazide (PRINZIDE,ZESTORETIC) 20-25 MG tablet TAKE ONE TABLET BY MOUTH ONCE DAILY 01/27/18   Moses Manners, MD  omeprazole (PRILOSEC) 20 MG capsule Take 1 capsule (20 mg total) by mouth daily for 14 days. 08/22/18 09/05/18  Wieters, Junius Creamer, PA-C    Family History Family History  Problem Relation Age of Onset  . Diabetes Father   . Diabetes Paternal Grandmother   . Cancer Maternal Grandfather        lung  . Colon cancer Neg Hx     Social History Social History   Tobacco Use  .  Smoking status: Never Smoker  . Smokeless tobacco: Never Used  Substance Use Topics  . Alcohol use: Yes    Comment: occasional social  . Drug use: No     Allergies   Oxycodone   Review of Systems Review of Systems  Respiratory: Positive for shortness of breath.   All other systems reviewed and are negative.    Physical Exam Updated Vital Signs BP (!) 156/105 (BP Location: Left Arm)   Pulse 92   Temp 98 F (36.7 C) (Oral)   Resp (!) 24   LMP 04/29/2016   SpO2 100%   Physical Exam  Constitutional: She is oriented to person, place, and time. She appears well-developed and well-nourished. No distress.  HENT:  Head: Normocephalic and atraumatic.  Neck: Normal range of motion. Neck supple.  Cardiovascular: Normal rate and regular rhythm. Exam reveals no gallop and no friction rub.  No murmur heard. Pulmonary/Chest: Effort normal and breath sounds normal. No respiratory distress. She has no wheezes.  Abdominal: Soft. Bowel sounds are normal. She exhibits no distension. There is no tenderness.  Musculoskeletal: Normal range of motion.       Right lower leg: Normal. She exhibits no tenderness and no edema.       Left lower leg: Normal. She exhibits no tenderness and no edema.  Neurological: She is alert and oriented to person, place, and time.  Skin: Skin is warm and dry. She is not diaphoretic.  Nursing note and vitals reviewed.    ED Treatments / Results  Labs (all labs ordered are listed, but only abnormal results are displayed) Labs Reviewed  BASIC METABOLIC PANEL  CBC  I-STAT TROPONIN, ED  I-STAT BETA HCG BLOOD, ED ([facility], [facility], [facility] ONLY)    EKG EKG Interpretation  Date/Time:  Monday September 22 2018 04:44:55 EDT Ventricular Rate:  83 PR Interval:    QRS Duration: 105 QT Interval:  418 QTC Calculation: 492 R Axis:   2 Text Interpretation:  Sinus rhythm Abnormal R-wave progression, early transition Borderline prolonged QT interval When compared with ECG of  08/22/2018, No significant change was found Confirmed by Dione Booze ([id number]) on 09/22/2018 4:48:24 AM   Radiology No results found.  Procedures Procedures (including critical care time)  Medications Ordered in ED Medications - No data to display   Initial Impression / Assessment and Plan / ED Course  I have reviewed the triage vital signs and the nursing notes.  Pertinent labs & imaging results that were available during my care of the patient were reviewed by me and considered in my medical decision making (see chart for details).  Patient presents here with the acute onset of shortness of breath while asleep.  She is supposed to be using a CPAP machine for obstructive sleep apnea, however she has not been.  She is also under additional stress with the death of her aunt with whom she was close.  I see nothing physical to explain this episode.  Her oxygen saturations are 100% and vitals are otherwise stable.  She is now feeling better.  I have considered PE, however feel this to be unlikely given the clinical setting.  At this point, I feel as though she is appropriate for discharge.  There may be a grief component to this episode.  She is to return as needed for any problems.  Final Clinical Impressions(s) / ED Diagnoses   Final diagnoses:  None    ED Discharge Orders    None       Geoffery Lyons, MD 09/22/18 [phone]

## 2018-09-22 NOTE — Discharge Instructions (Addendum)
Return to the emergency department if your symptoms significantly worsen or change. 

## 2018-09-22 NOTE — ED Notes (Addendum)
Patient transported to XR. 

## 2018-09-22 NOTE — ED Notes (Signed)
Patient c/o sob c/o chest pain worse with movement. C/o dry cough.

## 2018-11-20 ENCOUNTER — Other Ambulatory Visit: Payer: Self-pay | Admitting: Family Medicine

## 2018-11-20 ENCOUNTER — Telehealth: Payer: Self-pay | Admitting: Family Medicine

## 2018-11-20 DIAGNOSIS — I10 Essential (primary) hypertension: Secondary | ICD-10-CM

## 2018-11-20 NOTE — Telephone Encounter (Signed)
Pt needs a refill on her Lisinopril called and she said that for some reason when we send her medication it has her maiden name and she can not use her insurance. Margarito CourserSandra D Harmening  Is the correct name jw

## 2018-11-21 MED ORDER — LISINOPRIL-HYDROCHLOROTHIAZIDE 20-25 MG PO TABS: 1.0000 | ORAL_TABLET | Freq: Every day | ORAL | 3 refills | Status: DC

## 2018-11-21 NOTE — Telephone Encounter (Signed)
Rx sent both electronically and called in.

## 2018-11-24 ENCOUNTER — Other Ambulatory Visit: Payer: Self-pay | Admitting: Family Medicine

## 2019-01-08 ENCOUNTER — Encounter: Payer: Self-pay | Admitting: Family Medicine

## 2019-05-16 ENCOUNTER — Other Ambulatory Visit: Payer: Self-pay

## 2019-05-16 ENCOUNTER — Encounter (HOSPITAL_COMMUNITY): Payer: Self-pay

## 2019-05-16 ENCOUNTER — Ambulatory Visit (HOSPITAL_COMMUNITY)
Admission: EM | Admit: 2019-05-16 | Discharge: 2019-05-16 | Disposition: A | Discharge status: Home / Self Care | Payer: 59 | Attending: Family Medicine | Admitting: Family Medicine

## 2019-05-16 DIAGNOSIS — J0140 Acute pansinusitis, unspecified: Secondary | ICD-10-CM

## 2019-05-16 MED ORDER — AMOXICILLIN-POT CLAVULANATE 875-125 MG PO TABS: 1.0000 | ORAL_TABLET | Freq: Two times a day (BID) | ORAL | 0 refills | Status: DC

## 2019-05-16 MED ORDER — IPRATROPIUM BROMIDE 0.06 % NA SOLN: 2.0000 | Freq: Four times a day (QID) | NASAL | 1 refills | Status: DC

## 2019-05-16 MED ORDER — FLUTICASONE PROPIONATE 50 MCG/ACT NA SUSP: 2.0000 | Freq: Every day | NASAL | 1 refills | Status: DC

## 2019-05-16 NOTE — ED Triage Notes (Signed)
Pt has had sinus pain for past 2 weeks, has used several medications with no relief

## 2019-05-16 NOTE — ED Provider Notes (Signed)
[facility]    CSN: [id number] Arrival date & time: 05/16/19  1025     History   Chief Complaint Chief Complaint  Patient presents with  . Facial Pain    HPI Paula Austin is a 53 y.o. female.   53 year old female comes in for 2-week history of sinus pressure, now turning to pain.  Patient states history of seasonal allergies, and has been taking Zyrtec with mild relief.  She started on a nasal spray that was prescribed to her a year ago for similar symptoms, unsure if it was Flonase for Atrovent, but had mild relief until medicine ran out.  Now with sinus pressure/sinus headache.  She has mild cough with clear drainage.  Postnasal drip without obvious nasal congestion, rhinorrhea.  Denies shortness of breath, wheezing.  Denies fever, chills, body aches, night sweats.  Denies any sick contact.  She has also been using nasal saline spray, nasal saline rinse, Sudafed, other over-the-counter sinus medication without relief.     Past Medical History:  Diagnosis Date  . Difficult intubation 1999   "had thick neck" , trouble with intubatuion sept sept 2014 also  . GERD (gastroesophageal reflux disease)    occasional, none lately  . Goiter   . History of kidney stones   . Hyperlipidemia   . Hypertension   . Hypothyroidism   . Morbid obesity (HCC)   . OSA on CPAP    uses cpap some nights, pt does know settings  . PONV (postoperative nausea and vomiting) sept 2014   lots of coughing up phelgm  . Sleep apnea   . Thyroid disease     Patient Active Problem List   Diagnosis Date Noted  . Abdominal pain 08/25/2018  . Acute adjustment disorder with anxiety 04/21/2018  . 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 Routine general medical examination at a health care facility 05/22/2013  . Perimenopausal 05/22/2013  . 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 Hypertension 05/02/2011  . Sleep apnea 05/02/2011  . Morbid obesity (HCC) 05/02/2011    Past  Surgical History:  Procedure Laterality Date  . BREAST SURGERY Bilateral 11/15/06   Reduction  . BREATH TEK H PYLORI N/A 05/26/2013   Procedure: BREATH TEK H PYLORI;  Surgeon: Valarie Merino, MD;  Location: [facility];  Service: General;  Laterality: N/A;  . COLONOSCOPY WITH PROPOFOL N/A 06/17/2014   Procedure: COLONOSCOPY WITH PROPOFOL;  Surgeon: Rachael Fee, MD;  Location: [facility];  Service: Endoscopy;  Laterality: N/A;  . GASTRIC ROUX-EN-Y N/A 09/07/2013   Procedure: LAPAROSCOPIC ROUX-EN-Y GASTRIC upper endoscopy;  Surgeon: Valarie Merino, MD;  Location: [facility];  Service: General;  Laterality: N/A;  . THYROIDECTOMY  10/30/02  . TUBAL LIGATION  08/25/98   had hard time getting ET tube down per pt.     OB History   No obstetric history on file.      Home Medications    Prior to Admission medications   Medication Sig Start Date End Date Taking? Authorizing Provider  amoxicillin-clavulanate (AUGMENTIN) 875-125 MG tablet Take 1 tablet by mouth every 12 (twelve) hours. 05/16/19   Cathie Hoops, Sha Amer V, PA-C  cyanocobalamin 1000 MCG tablet Take 1,000 mcg by mouth daily.     [provider]  fluticasone (FLONASE) 50 MCG/ACT nasal spray Place 2 sprays into both nostrils daily. 05/16/19   Cathie Hoops, Deatrice Spanbauer V, PA-C  ipratropium (ATROVENT) 0.06 % nasal spray Place 2 sprays into both nostrils 4 (four) times daily.  05/16/19   Cathie Hoops, Kavitha Lansdale V, PA-C  levothyroxine (SYNTHROID, LEVOTHROID) 125 MCG tablet Take 1 tablet (125 mcg total) by mouth daily before breakfast. 11/24/18   Hensel, Santiago Bumpers, MD  lisinopril-hydrochlorothiazide (PRINZIDE,ZESTORETIC) 20-25 MG tablet Take 1 tablet by mouth daily. 11/21/18   Moses Manners, MD  omeprazole (PRILOSEC) 20 MG capsule Take 1 capsule (20 mg total) by mouth daily for 14 days. 08/22/18 09/22/26  Wieters, Junius Creamer, PA-C    Family History Family History  Problem Relation Age of Onset  . Diabetes Father   . Diabetes Paternal Grandmother   . Cancer Maternal Grandfather         lung  . Colon cancer Neg Hx     Social History Social History   Tobacco Use  . Smoking status: Never Smoker  . Smokeless tobacco: Never Used  Substance Use Topics  . Alcohol use: Yes    Comment: occasional social  . Drug use: No     Allergies   Oxycodone   Review of Systems Review of Systems  Reason unable to perform ROS: See HPI as above.     Physical Exam Triage Vital Signs ED Triage Vitals  Enc Vitals Group     BP 05/16/19 1116 131/89     Pulse Rate 05/16/19 1116 74     Resp 05/16/19 1116 18     Temp 05/16/19 1116 98.7 F (37.1 C)     Temp src --      SpO2 05/16/19 1116 99 %     Weight --      Height --      Head Circumference --      Peak Flow --      Pain Score 05/16/19 1114 6     Pain Loc --      Pain Edu? --      Excl. in GC? --    No data found.  Updated Vital Signs BP 131/89   Pulse 74   Temp 98.7 F (37.1 C)   Resp 18   LMP 04/29/2016   SpO2 99%   Physical Exam Constitutional:      General: She is not in acute distress.    Appearance: Normal appearance. She is not ill-appearing, toxic-appearing or diaphoretic.  HENT:     Head: Normocephalic and atraumatic.     Nose:     Right Sinus: Maxillary sinus tenderness and frontal sinus tenderness present.     Left Sinus: Maxillary sinus tenderness and frontal sinus tenderness present.     Mouth/Throat:     Mouth: Mucous membranes are moist.     Pharynx: Oropharynx is clear. Uvula midline.  Neck:     Musculoskeletal: Normal range of motion and neck supple.  Cardiovascular:     Rate and Rhythm: Normal rate and regular rhythm.     Heart sounds: Normal heart sounds. No murmur. No friction rub. No gallop.   Pulmonary:     Effort: Pulmonary effort is normal. No accessory muscle usage, prolonged expiration, respiratory distress or retractions.     Comments: Lungs clear to auscultation without adventitious lung sounds. Neurological:     General: No focal deficit present.     Mental  Status: She is alert and oriented to person, place, and time.      UC Treatments / Results  Labs (all labs ordered are listed, but only abnormal results are displayed) Labs Reviewed - No data to display  EKG None  Radiology No results found.  Procedures Procedures (  including critical care time)  Medications Ordered in UC Medications - No data to display  Initial Impression / Assessment and Plan / UC Course  I have reviewed the triage vital signs and the nursing notes.  Pertinent labs & imaging results that were available during my care of the patient were reviewed by me and considered in my medical decision making (see chart for details).    Augmentin for possible sinusitis given 2-week history.  Discussed with patient, could also be due to allergies.  Atrovent and Flonase refilled.  Continue Zyrtec.  Return precautions given.  Discussed other viral symptoms, quarantine criteria, resources for COVID questions.  Return precautions given.  Patient expresses understanding and agrees to plan.  Final Clinical Impressions(s) / UC Diagnoses   Final diagnoses:  Acute non-recurrent pansinusitis    ED Prescriptions    Medication Sig Dispense Auth. Provider   amoxicillin-clavulanate (AUGMENTIN) 875-125 MG tablet Take 1 tablet by mouth every 12 (twelve) hours. 14 tablet Rece Zechman V, PA-C   fluticasone (FLONASE) 50 MCG/ACT nasal spray Place 2 sprays into both nostrils daily. 1 g Jayvien Rowlette V, PA-C   ipratropium (ATROVENT) 0.06 % nasal spray Place 2 sprays into both nostrils 4 (four) times daily. 15 mL Threasa AlphaYu, Javanni Maring V, PA-C        Imad Shostak V, [place]PA-C 05/16/19 1223

## 2019-05-16 NOTE — Discharge Instructions (Signed)
Start augmentin as directed. Start flonase and atrovent for nasal congestion/drainage.    As discussed, currently symptoms most likely due to allergies. If develop cold symptoms such as cough, fever, chills, body aches, please self quarantine for 7 days since symptoms started AND more than 72 hours of no fever and cough prior to leaving the house. You can call COVID hotline [phone]) or use [facility] visit online if you have questions, or symptoms worsens to determine where you should seek care. If experiencing shortness of breath, trouble breathing, call 911 and provide them with your current situation.

## 2019-06-24 ENCOUNTER — Other Ambulatory Visit: Payer: Self-pay

## 2019-06-24 ENCOUNTER — Encounter: Payer: Self-pay | Admitting: Family Medicine

## 2019-06-24 ENCOUNTER — Ambulatory Visit (INDEPENDENT_AMBULATORY_CARE_PROVIDER_SITE_OTHER): Payer: 59 | Admitting: Family Medicine

## 2019-06-24 DIAGNOSIS — E890 Postprocedural hypothyroidism: Secondary | ICD-10-CM | POA: Diagnosis not present

## 2019-06-24 DIAGNOSIS — Z0000 Encounter for general adult medical examination without abnormal findings: Secondary | ICD-10-CM

## 2019-06-24 DIAGNOSIS — I10 Essential (primary) hypertension: Secondary | ICD-10-CM | POA: Diagnosis not present

## 2019-06-24 DIAGNOSIS — Z9884 Bariatric surgery status: Secondary | ICD-10-CM

## 2019-06-24 LAB — POCT GLYCOSYLATED HEMOGLOBIN (HGB A1C): Hemoglobin A1C: 5.7 % — AB (ref 4.0–5.6)

## 2019-06-24 NOTE — Patient Instructions (Signed)
You know what you need.  Lose weight and exercise more. I will call Tuesday with the blood test results. Good luck with graduation in August.  I hope you can have a healthier lifestyle once you graduate.

## 2019-06-24 NOTE — Assessment & Plan Note (Signed)
At risk for DM plus other problems

## 2019-06-24 NOTE — Assessment & Plan Note (Signed)
Puts her at risk for iron and B12 deficiency.

## 2019-06-24 NOTE — Assessment & Plan Note (Signed)
Obesity continues to be her main risk factory.

## 2019-06-24 NOTE — Assessment & Plan Note (Signed)
Check TSH 

## 2019-06-24 NOTE — Progress Notes (Signed)
Established Patient Office Visit  Subjective:  Patient ID: Paula Austin, female    DOB: 1966-05-27  Age: 53 y.o. MRN: [id number]  CC:  Chief Complaint  Patient presents with  . Annual Exam    HPI Paula Austin presents for annual exam.  Generally feels well.  Has not been as active with COVID.  Wt has crept up.  Issues: 1. Morbid Obesity.  S/P bariatric surgery.  Wt has crept back up - especially with the COVID isoloation 2. Hypertension.  Doing well on current meds without difficulty 3. S/P bariatric surgery puts her at risk for both B12 and iron deficiency.  She is having some tingling of her hands and feet.  No other suggestive symptoms 4. HPDP up to date 5. Fh of Dm.  Obese and wants screening 6. Tingling of hands and feet.  B12 as above.  Also consider other causes of neuropathy.     Past Medical History:  Diagnosis Date  . Difficult intubation 1999   "had thick neck" , trouble with intubatuion sept sept 2014 also  . GERD (gastroesophageal reflux disease)    occasional, none lately  . Goiter   . History of kidney stones   . Hyperlipidemia   . Hypertension   . Hypothyroidism   . Morbid obesity ([place])   . OSA on CPAP    uses cpap some nights, pt does know settings  . PONV (postoperative nausea and vomiting) sept 2014   lots of coughing up phelgm  . Sleep apnea   . Thyroid disease     Past Surgical History:  Procedure Laterality Date  . BREAST SURGERY Bilateral 11/15/06   Reduction  . BREATH TEK H PYLORI N/A 05/26/2013   Procedure: BREATH TEK H PYLORI;  Surgeon: Pedro Earls, MD;  Location: [facility];  Service: General;  Laterality: N/A;  . COLONOSCOPY WITH PROPOFOL N/A 06/17/2014   Procedure: COLONOSCOPY WITH PROPOFOL;  Surgeon: Milus Banister, MD;  Location: [facility];  Service: Endoscopy;  Laterality: N/A;  . GASTRIC ROUX-EN-Y N/A 09/07/2013   Procedure: LAPAROSCOPIC ROUX-EN-Y GASTRIC upper endoscopy;  Surgeon: Pedro Earls, MD;  Location:  [facility];  Service: General;  Laterality: N/A;  . THYROIDECTOMY  10/30/02  . TUBAL LIGATION  08/25/98   had hard time getting ET tube down per pt.     Family History  Problem Relation Age of Onset  . Diabetes Father   . Diabetes Paternal Grandmother   . Cancer Maternal Grandfather        lung  . Colon cancer Neg Hx     Social History   Socioeconomic History  . Marital status: Married    Spouse name: Not on file  . Number of children: Not on file  . Years of education: Not on file  . Highest education level: Not on file  Occupational History  . Not on file  Social Needs  . Financial resource strain: Not on file  . Food insecurity    Worry: Not on file    Inability: Not on file  . Transportation needs    Medical: Not on file    Non-medical: Not on file  Tobacco Use  . Smoking status: Never Smoker  . Smokeless tobacco: Never Used  Substance and Sexual Activity  . Alcohol use: Yes    Comment: occasional social  . Drug use: No  . Sexual activity: Not on file  Lifestyle  . Physical activity    Days per week:  Not on file    Minutes per session: Not on file  . Stress: Not on file  Relationships  . Social Herbalist on phone: Not on file    Gets together: Not on file    Attends religious service: Not on file    Active member of club or organization: Not on file    Attends meetings of clubs or organizations: Not on file    Relationship status: Not on file  . Intimate partner violence    Fear of current or ex partner: Not on file    Emotionally abused: Not on file    Physically abused: Not on file    Forced sexual activity: Not on file  Other Topics Concern  . Not on file  Social History Narrative  . Not on file    Outpatient Medications Prior to Visit  Medication Sig Dispense Refill  . levothyroxine (SYNTHROID, LEVOTHROID) 125 MCG tablet Take 1 tablet (125 mcg total) by mouth daily before breakfast. 90 tablet 3  . lisinopril-hydrochlorothiazide  (PRINZIDE,ZESTORETIC) 20-25 MG tablet Take 1 tablet by mouth daily. 90 tablet 3  . cyanocobalamin 1000 MCG tablet Take 1,000 mcg by mouth daily.     . fluticasone (FLONASE) 50 MCG/ACT nasal spray Place 2 sprays into both nostrils daily. 1 g 1  . amoxicillin-clavulanate (AUGMENTIN) 875-125 MG tablet Take 1 tablet by mouth every 12 (twelve) hours. 14 tablet 0  . ipratropium (ATROVENT) 0.06 % nasal spray Place 2 sprays into both nostrils 4 (four) times daily. 15 mL 1  . omeprazole (PRILOSEC) 20 MG capsule Take 1 capsule (20 mg total) by mouth daily for 14 days. 30 capsule 0   No facility-administered medications prior to visit.     Allergies  Allergen Reactions  . Oxycodone Anxiety    Objects moving around room; felt she was out of control and felt she was going crazy    ROS Review of Systems   Denies HA, CP, SOB, abd pain, weakness, skin lesions Denies change in bowel, bladder appetite or weight. Objective:    Physical Exam  BP 102/78   Pulse 75   Wt (!) 304 lb 9.6 oz (138.2 kg)   LMP 04/29/2016   SpO2 97%   BMI 46.31 kg/m  Wt Readings from Last 3 Encounters:  06/24/19 (!) 304 lb 9.6 oz (138.2 kg)  08/25/18 296 lb (134.3 kg)  04/30/18 296 lb 6.4 oz (134.4 kg)   HEENT WNL Neck supple Lungs clear Cardiac RRR without m or g Abd benign Ext no edema, normal pulses Neuro, motor, sensory and gait grossly normal.  There are no preventive care reminders to display for this patient.  There are no preventive care reminders to display for this patient.  Lab Results  Component Value Date   TSH 2.480 04/21/2018   Lab Results  Component Value Date   WBC 7.7 09/22/2018   HGB 12.2 09/22/2018   HCT 38.2 09/22/2018   MCV 93.9 09/22/2018   PLT 231 09/22/2018   Lab Results  Component Value Date   NA 139 09/22/2018   K 3.8 09/22/2018   CO2 26 09/22/2018   GLUCOSE 102 (H) 09/22/2018   BUN 8 09/22/2018   CREATININE 0.84 09/22/2018   BILITOT 0.4 08/22/2018   ALKPHOS 87  08/22/2018   AST 17 08/22/2018   ALT 17 08/22/2018   PROT 7.8 08/22/2018   ALBUMIN 3.8 08/22/2018   CALCIUM 8.9 09/22/2018   ANIONGAP 7 09/22/2018   Lab  Results  Component Value Date   CHOL 191 11/29/2016   Lab Results  Component Value Date   HDL 63 11/29/2016   Lab Results  Component Value Date   LDLCALC 114 (H) 11/29/2016   Lab Results  Component Value Date   TRIG 68 11/29/2016   Lab Results  Component Value Date   CHOLHDL 3.0 11/29/2016   Lab Results  Component Value Date   HGBA1C 5.7 (A) 06/24/2019      Assessment & Plan:   Problem List Items Addressed This Visit    Hypertension   Relevant Orders   CMP14+EGFR   CBC   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   Follow-up: No follow-ups on file.    Zenia Resides, MD

## 2019-06-24 NOTE — Assessment & Plan Note (Signed)
Check labs.  Stable on current meds. 

## 2019-06-25 LAB — CMP14+EGFR
ALT: 18 IU/L (ref 0–32)
AST: 17 IU/L (ref 0–40)
Albumin/Globulin Ratio: 1.4 (ref 1.2–2.2)
Albumin: 4.3 g/dL (ref 3.8–4.9)
Alkaline Phosphatase: 92 IU/L (ref 39–117)
BUN/Creatinine Ratio: 16 (ref 9–23)
BUN: 12 mg/dL (ref 6–24)
Bilirubin Total: 0.4 mg/dL (ref 0.0–1.2)
CO2: 26 mmol/L (ref 20–29)
Calcium: 9.8 mg/dL (ref 8.7–10.2)
Chloride: 98 mmol/L (ref 96–106)
Creatinine, Ser: 0.77 mg/dL (ref 0.57–1.00)
GFR calc Af Amer: 101 mL/min/{1.73_m2} (ref >59)
GFR calc non Af Amer: 87 mL/min/{1.73_m2} (ref >59)
Globulin, Total: 3.1 g/dL (ref 1.5–4.5)
Glucose: 91 mg/dL (ref 65–99)
Potassium: 3.9 mmol/L (ref 3.5–5.2)
Sodium: 140 mmol/L (ref 134–144)
Total Protein: 7.4 g/dL (ref 6.0–8.5)

## 2019-06-25 LAB — CBC
Hematocrit: 37.5 % (ref 34.0–46.6)
Hemoglobin: 12.5 g/dL (ref 11.1–15.9)
MCH: 30.7 pg (ref 26.6–33.0)
MCHC: 33.3 g/dL (ref 31.5–35.7)
MCV: 92 fL (ref 79–97)
Platelets: 269 10*3/uL (ref 150–450)
RBC: 4.07 x10E6/uL (ref 3.77–5.28)
RDW: 13.2 % (ref 11.7–15.4)
WBC: 7.6 10*3/uL (ref 3.4–10.8)

## 2019-06-25 LAB — FERRITIN: Ferritin: 57 ng/mL (ref 15–150)

## 2019-06-25 LAB — TSH: TSH: 1 u[IU]/mL (ref 0.450–4.500)

## 2019-06-25 LAB — VITAMIN B12: Vitamin B-12: 1422 pg/mL — ABNORMAL HIGH (ref 232–1245)

## 2019-07-29 ENCOUNTER — Other Ambulatory Visit: Payer: Self-pay

## 2019-07-29 DIAGNOSIS — Z20822 Contact with and (suspected) exposure to covid-19: Secondary | ICD-10-CM

## 2019-07-30 LAB — NOVEL CORONAVIRUS, NAA: SARS-CoV-2, NAA: NOT DETECTED

## 2019-10-23 ENCOUNTER — Other Ambulatory Visit: Payer: Self-pay

## 2019-10-23 ENCOUNTER — Ambulatory Visit (INDEPENDENT_AMBULATORY_CARE_PROVIDER_SITE_OTHER): Payer: 59 | Admitting: Family Medicine

## 2019-10-23 DIAGNOSIS — R1011 Right upper quadrant pain: Secondary | ICD-10-CM | POA: Diagnosis not present

## 2019-10-23 NOTE — Patient Instructions (Signed)
Thank you so much for coming in to see me!  I am so glad you are feeling better. I am sorry we couldn't find out exactly what was causing your pain.  At this time I recommend you take Pepcid regularly to help with the indigestion and Miralax as needed for constipation. If your pain returns please come to see [place] ASAP so we can get further testing to determine the cause.  Have a great day  Dr. Tarry Kos

## 2019-10-23 NOTE — Progress Notes (Signed)
   Subjective:   Patient ID: Paula Austin    DOB: [birth year], 53 y.o. female   MRN: [id number]  Paula Austin is a 53 y.o. female with a history of HTN, OSA on CPAP, GERD, hypothyroidism, adjustment disorder with anxiety, h/o Roux Y Gastric Bypass (2014) here for abdominal pain.  Right sided Abdominal Pain: Patient presenting with RUQ pain that radiated down to RLQ x 6 days that has since resolved (10/28-11/4). She notes the pain was sudden onset, constant for the last 6 days. She endorses some nausea but no vomiting. She notes it has been difficult to eat due to nausea, but the pain did not change much with food. She also notes abdominal bloating and tightness.  She notes the she had acid and indigestion prior to pain onset. This acidic pain persistent when the right sided abdominal pain developed. She felt her whole stomach and abdomen was acidic. She treated this with Tums and Pepcid that improved her symptoms. She treated with NSAIDs without improvement.Patient has history of RnY gastric bypass in 2014. She had an ultrasound prior to her bypass surgery that showed "three echogenic lesions along gallbladder wall, nonmobile, measuring 26mm, 48mm, and 75mm." She also endorses some constipation with harder stools, she had a more regular soft bowel movement this morning. She also endorses history of kidney stones. She notes that the pain may have felt similarly to prior kidney stone. She notes she felt like she may have passed a kidney stone based on a feeling however she is unsure. She cant recall if after this her pain improved. Denies any hematuria or fevers, vaginal discharge or bleeding. LMP was in June 2019.   Review of Systems:  Per HPI.   Sharon, medications and smoking status reviewed.  Objective:   BP 124/80   Pulse 84   Wt (!) 301 lb 3.2 oz (136.6 kg)   LMP 04/29/2016   SpO2 98%   BMI 45.80 kg/m  Vitals and nursing note reviewed.  General: pleasant older lady, well nourished,  well developed, in no acute distress with non-toxic appearance, sitting comfortably on exam bed Resp: Speaking in full sentences, breathing comfortably on room air Abdomen: soft, mildly tender to deep palpation along right upper and lower quadrant, epigastric area nontender, Murphy's sign negative, normoactive bowel sounds, No CVA tenderness Skin: warm, dry Extremities: warm and well perfused MSK: gait normal Neuro: Alert and oriented, speech normal  Assessment & Plan:   Abdominal pain Acute, resolved. Patient presenting with right sided abdominal pain x 6 days that has since resolved. No changes with food or NSAIDs. Unclear etiology at this time and suspect multifactorial. Suspect GERD and constipation were likely contributing to her symptoms. Differential also includes nephrolithiasis with passage of stone given patient has a history of kidney stones and feels she may have passed a stone. Other less likely differentials cholecystitis vs appendicitis vs gastric ulcer vs ovarian etiology. No infectious symptoms to indicate infectious etiology. Recommended conservative treatment at this time. Patient instructed to RTC if pain recurs for further evaluation  - NSAID/Tylenol for pain PRN - Miralax PRN for constipation - OTC Pepcid QD for GERD and Tums PRN for breakthrough pain - RTC if pain recurs for further evaluation   Mina Marble, DO PGY-2, [facility] 10/24/2019 4:15 PM

## 2019-10-24 NOTE — Assessment & Plan Note (Signed)
Acute, resolved. Patient presenting with right sided abdominal pain x 6 days that has since resolved. No changes with food or NSAIDs. Unclear etiology at this time and suspect multifactorial. Suspect GERD and constipation were likely contributing to her symptoms. Differential also includes nephrolithiasis with passage of stone given patient has a history of kidney stones and feels she may have passed a stone. Other less likely differentials cholecystitis vs appendicitis vs gastric ulcer vs ovarian etiology. No infectious symptoms to indicate infectious etiology. Recommended conservative treatment at this time. Patient instructed to RTC if pain recurs for further evaluation  - NSAID/Tylenol for pain PRN - Miralax PRN for constipation - OTC Pepcid QD for GERD and Tums PRN for breakthrough pain - RTC if pain recurs for further evaluation

## 2019-12-07 ENCOUNTER — Encounter: Payer: Self-pay | Admitting: Family Medicine

## 2019-12-07 ENCOUNTER — Telehealth (INDEPENDENT_AMBULATORY_CARE_PROVIDER_SITE_OTHER): Payer: 59 | Admitting: Family Medicine

## 2019-12-07 ENCOUNTER — Other Ambulatory Visit: Payer: Self-pay

## 2019-12-07 DIAGNOSIS — J0100 Acute maxillary sinusitis, unspecified: Secondary | ICD-10-CM

## 2019-12-07 MED ORDER — AMOXICILLIN-POT CLAVULANATE 875-125 MG PO TABS: 1.0000 | ORAL_TABLET | Freq: Two times a day (BID) | ORAL | 0 refills | Status: DC

## 2019-12-07 NOTE — Progress Notes (Signed)
[facility] Visit  Patient consented to have virtual visit. Method of visit: Video  Encounter participants: Patient: Paula Austin - located at office  Provider: Martyn Malay - located at home  Others (if applicable): none   Chief Complaint: sinus pressure   HPI: Paula Austin is a very pleasant 53 year old with history of hypothyroidism presenting with bilateral sinus pressure. This has been ongoing ~ 2 weeks. She travelled to [place] for funeral 2 weeks ago. Has had two weeks of congestion, maxillary pain, ear fullness, and intermittent dizziness. No fevers, dyspnea, chest pain. She reports a negative COVID test on Monday. No other symptoms. She has tried everything she reports- flonase, saline, over the counter cold medications. She reports this is typical of her yearly sinus infection.   ROS: per HPI  Pertinent PMHx:  Thyroid disease Roux en Y gastric bypass   Exam:  Respiratory: Speaking in full sentences, comfortable, pain with palpation of maxillary region per patient.   Assessment/Plan: Diagnoses and all orders for this visit:  Acute non-recurrent maxillary sinusitis, no signs suggestive of pneumonia, no dental infection. Had negative COVID test last week. Dizziness associated with ear fullness and maxillary pain, reviewed return precautions including change in dizziness, dyspnea, lack of improvement.  -     amoxicillin-clavulanate (AUGMENTIN) 875-125 MG tablet; Take 1 tablet by mouth 2 (two) times daily. -  Continue saline and intra-nasal steroid.   Time spent during visit with patient: 8 minutes  Dorris Singh, MD  [facility] Medicine Teaching Service

## 2020-01-04 ENCOUNTER — Other Ambulatory Visit: Payer: Self-pay

## 2020-01-04 ENCOUNTER — Ambulatory Visit (INDEPENDENT_AMBULATORY_CARE_PROVIDER_SITE_OTHER): Payer: Self-pay | Admitting: Student in an Organized Health Care Education/Training Program

## 2020-01-04 VITALS — BP 128/86 | HR 80 | Wt 297.4 lb

## 2020-01-04 DIAGNOSIS — M545 Low back pain, unspecified: Secondary | ICD-10-CM

## 2020-01-04 DIAGNOSIS — K5900 Constipation, unspecified: Secondary | ICD-10-CM

## 2020-01-04 DIAGNOSIS — R1031 Right lower quadrant pain: Secondary | ICD-10-CM

## 2020-01-04 LAB — POCT URINALYSIS DIP (MANUAL ENTRY)
Bilirubin, UA: NEGATIVE
Blood, UA: NEGATIVE
Glucose, UA: NEGATIVE mg/dL
Ketones, POC UA: NEGATIVE mg/dL
Nitrite, UA: NEGATIVE
Protein Ur, POC: NEGATIVE mg/dL
Spec Grav, UA: 1.025 (ref 1.010–1.025)
Urobilinogen, UA: 1 E.U./dL
pH, UA: 7 (ref 5.0–8.0)

## 2020-01-04 LAB — POCT UA - MICROSCOPIC ONLY

## 2020-01-04 MED ORDER — SENNA 8.6 MG PO TABS: 1.0000 | ORAL_TABLET | Freq: Every day | ORAL | 0 refills | Status: DC | PRN

## 2020-01-04 MED ORDER — METAMUCIL 0.52 G PO CAPS: 0.5200 g | ORAL_CAPSULE | Freq: Two times a day (BID) | ORAL | 0 refills | Status: DC

## 2020-01-04 MED ORDER — SIMETHICONE 80 MG PO CHEW: 80.0000 mg | CHEWABLE_TABLET | Freq: Four times a day (QID) | ORAL | 0 refills | Status: DC | PRN

## 2020-01-04 NOTE — Assessment & Plan Note (Signed)
Intermittent and more severe in back to RLQ.  Urinalysis negative. Could have possibly passed kidney stone over the weekend as pain improved and no blood on urinalysis.

## 2020-01-04 NOTE — Patient Instructions (Signed)
It was a pleasure to see you today!  To summarize our discussion for this visit:  I'm sorry to hear that you are still having this pain.  Your urine was negative for signs of infection or blood and it just doesn't sound like kidney stones to me.   I think a contributor to your pain is constipation. I will prescribe you some oral medications to help with your bowel movements and we can get more aggressive from there if needed.   Please return to our clinic to see [place] if not improving with this treatment.  Call the clinic at [phone] if your symptoms worsen or you have any concerns.   Thank you for allowing me to take part in your care,  Dr. Jamelle Rushing

## 2020-01-04 NOTE — Assessment & Plan Note (Signed)
New onset stool changes with abdominal pain. Negative colonoscopy within the last 5 years. Benign exam significant for hypoactive BS. Discussed options with patient for treatment and she chose to approach conservatively and escalate as needed.  Initiating fiber + senna + gas x treatment and will consider suppository/enema in the future as needed. If patient continues to have pain, would consider imaging and/or GI workup. Does not appear to be appendix/gallbladder/urinary origin per history. Cannot rule out reproductive origin but unlikely torsion.

## 2020-01-04 NOTE — Progress Notes (Signed)
Subjective:    Patient ID: Paula Austin, female    DOB: [birth year], 54 y.o.   MRN: [id number]  CC: RLQ pain  HPI:  Paula Austin is a 54 year old female who is presenting with intermittent right lower quadrant pain for the past several months.  Pain has returned on Friday as severe.  Described as sharp and cramping pain from right lower lumbar area and radiates down towards right groin area.  Not currently in pain during appointment.  Over the weekend, patient used heat and over-the-counter medications to improve the pain.  Denies dysuria, blood in urine.  Endorses some nausea when the pain was at its most severe but no vomiting.  Able to tolerate normal diet.  Denies fever or rashes, there is no pain associated with urination.  Does have history of Roux-en-Y surgery without complications several years ago and history of GERD.  This pain does not seem to be associated with eating other than 1 time. Last menstrual period was over a year ago and denies any vaginal bleeding or blood in stool. Her regular bowel movement habits are 2 soft bowel movements every day but in the past several months she has had only 1 bowel movement per day which is small and hard in character and has difficulty passing the stool.  Patient had colonoscopy 2015 which was negative for polyps or cancerous lesions.  Denies B symptoms.  Smoking status reviewed   ROS: pertinent noted in the HPI   I have personally reviewed pertinent past medical history, surgical, family, and social history as appropriate. Objective:  BP 128/86   Pulse 80   Wt 297 lb 6.4 oz (134.9 kg)   LMP 04/29/2016   SpO2 99%   BMI 45.22 kg/m   Vitals and nursing note reviewed  General: NAD, pleasant, able to participate in exam Abdominal: Obese abdomen with hypoactive bowel sounds diffusely.  Surgical scars present and well-healed from laparoscopic procedure.  Negative for tenderness to palpation diffusely.  No masses appreciated. GU: Negative  CVA tenderness bilaterally, negative suprapubic tenderness. Extremities: no edema or cyanosis. Skin: warm and dry, no rashes noted Neuro: alert, no obvious focal deficits Psych: Normal affect and mood  Urinalysis    Component Value Date/Time   COLORURINE YELLOW 12/01/2012 1551   APPEARANCEUR CLOUDY (A) 12/01/2012 1551   LABSPEC 1.026 12/01/2012 1551   PHURINE 6.0 12/01/2012 1551   GLUCOSEU NEGATIVE 12/01/2012 1551   HGBUR NEGATIVE 12/01/2012 1551   BILIRUBINUR negative 01/04/2020 0855   KETONESUR negative 01/04/2020 0855   KETONESUR NEGATIVE 12/01/2012 1551   PROTEINUR negative 01/04/2020 0855   PROTEINUR NEGATIVE 12/01/2012 1551   UROBILINOGEN 1.0 01/04/2020 0855   UROBILINOGEN 1.0 12/01/2012 1551   NITRITE Negative 01/04/2020 0855   NITRITE NEGATIVE 12/01/2012 1551   LEUKOCYTESUR Trace (A) 01/04/2020 0855    Assessment & Plan:   Constipation New onset stool changes with abdominal pain. Negative colonoscopy within the last 5 years. Benign exam significant for hypoactive BS. Discussed options with patient for treatment and she chose to approach conservatively and escalate as needed.  Initiating fiber + senna + gas x treatment and will consider suppository/enema in the future as needed. If patient continues to have pain, would consider imaging and/or GI workup. Does not appear to be appendix/gallbladder/urinary origin per history. Cannot rule out reproductive origin but unlikely torsion.   Abdominal pain Intermittent and more severe in back to RLQ.  Urinalysis negative. Could have possibly passed kidney stone over the weekend  as pain improved and no blood on urinalysis.  Orders Placed This Encounter  Procedures  . POCT urinalysis dipstick  . POCT UA - Microscopic Only    Meds ordered this encounter  Medications  . simethicone (GAS-X) 80 MG chewable tablet    Sig: Chew 1 tablet (80 mg total) by mouth every 6 (six) hours as needed for flatulence.    Dispense:  30  tablet    Refill:  0  . psyllium (METAMUCIL) 0.52 g capsule    Sig: Take 1 capsule (0.52 g total) by mouth 2 (two) times daily.    Dispense:  90 capsule    Refill:  0  . senna (SENOKOT) 8.6 MG TABS tablet    Sig: Take 1 tablet (8.6 mg total) by mouth daily as needed for mild constipation.    Dispense:  30 tablet    Refill:  0    Doristine Mango, [place] PGY-2

## 2020-06-05 IMAGING — CR DG CHEST 2V
2 series · 2 of 2 positions shown · non-contrast
Comparison: 06/09/2013

CLINICAL DATA: Shortness of breath.

EXAM:
CHEST - 2 VIEW

[chest pa]
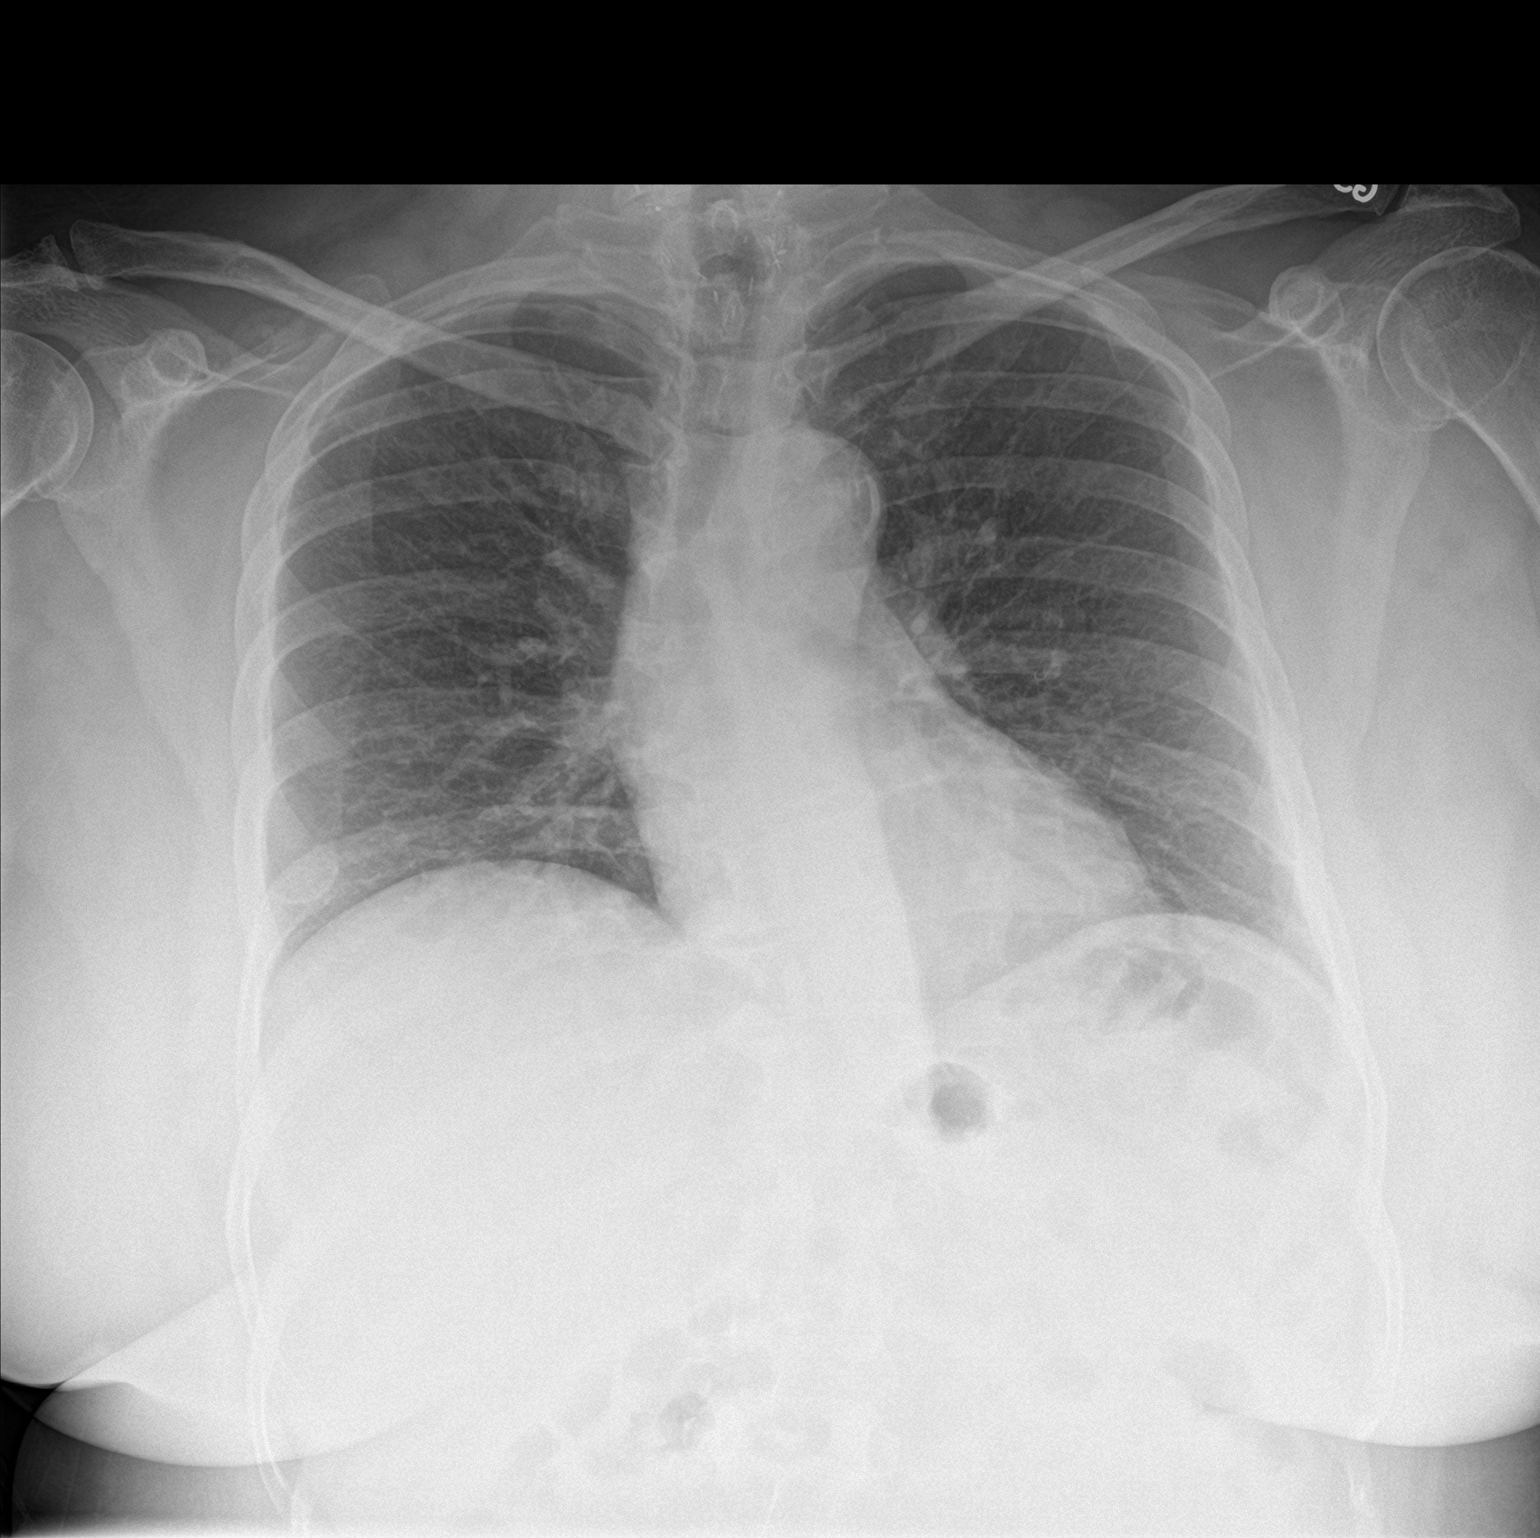

[chest lat]
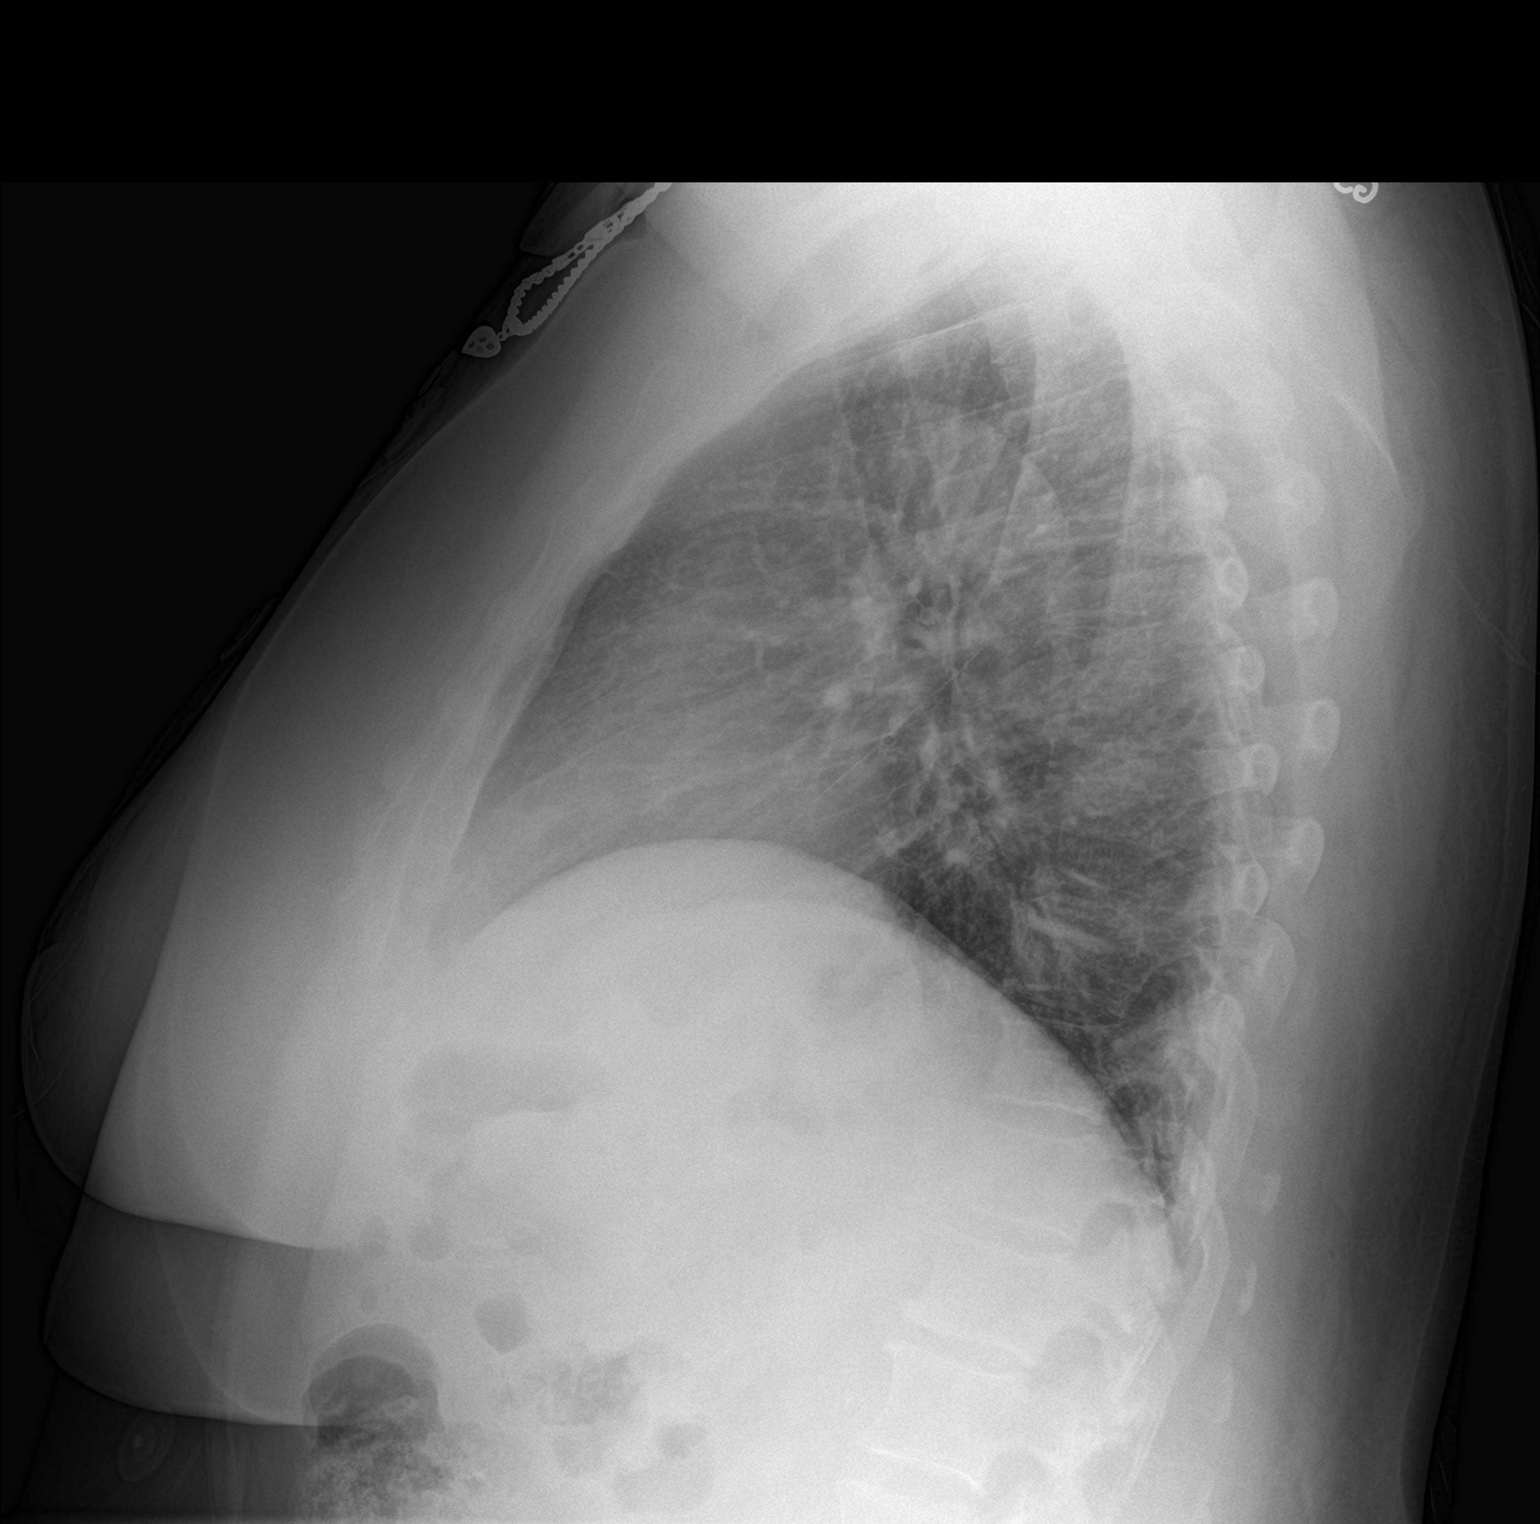

[2 of 2 positions shown; findings below may reference images not displayed]

FINDINGS: Lung volumes are low.The cardiomediastinal contours are normal.
Pulmonary vasculature is normal. No consolidation, pleural effusion,
or pneumothorax. No acute osseous abnormalities are seen. Scoliotic
curvature of spine.
IMPRESSION: No acute findings.

## 2020-07-18 ENCOUNTER — Other Ambulatory Visit: Payer: Self-pay

## 2020-07-18 DIAGNOSIS — I10 Essential (primary) hypertension: Secondary | ICD-10-CM

## 2020-07-18 MED ORDER — LEVOTHYROXINE SODIUM 125 MCG PO TABS: 125.0000 ug | ORAL_TABLET | Freq: Every day | ORAL | 3 refills | Status: DC

## 2020-07-18 MED ORDER — LISINOPRIL-HYDROCHLOROTHIAZIDE 20-25 MG PO TABS: 1.0000 | ORAL_TABLET | Freq: Every day | ORAL | 3 refills | Status: DC

## 2020-08-10 ENCOUNTER — Encounter: Payer: Self-pay | Admitting: Family Medicine

## 2020-08-10 ENCOUNTER — Ambulatory Visit (INDEPENDENT_AMBULATORY_CARE_PROVIDER_SITE_OTHER): Payer: Self-pay | Admitting: Family Medicine

## 2020-08-10 ENCOUNTER — Other Ambulatory Visit: Payer: Self-pay

## 2020-08-10 ENCOUNTER — Other Ambulatory Visit (HOSPITAL_COMMUNITY)
Admission: RE | Admit: 2020-08-10 | Discharge: 2020-08-10 | Disposition: A | Discharge status: Home / Self Care | Payer: Self-pay | Source: Ambulatory Visit | Attending: Family Medicine | Admitting: Family Medicine

## 2020-08-10 VITALS — BP 104/76 | HR 104 | Ht 68.0 in | Wt 289.8 lb

## 2020-08-10 DIAGNOSIS — Z0000 Encounter for general adult medical examination without abnormal findings: Secondary | ICD-10-CM

## 2020-08-10 DIAGNOSIS — Z124 Encounter for screening for malignant neoplasm of cervix: Secondary | ICD-10-CM | POA: Insufficient documentation

## 2020-08-10 DIAGNOSIS — E890 Postprocedural hypothyroidism: Secondary | ICD-10-CM

## 2020-08-10 DIAGNOSIS — Z9884 Bariatric surgery status: Secondary | ICD-10-CM

## 2020-08-10 DIAGNOSIS — I10 Essential (primary) hypertension: Secondary | ICD-10-CM

## 2020-08-10 NOTE — Patient Instructions (Signed)
Come tomorrow to get the blood work done. I will call with lab test results. No changes in the medications unless I am surprised by the blood work. Keep working on that weight loss.

## 2020-08-11 DIAGNOSIS — Z0000 Encounter for general adult medical examination without abnormal findings: Secondary | ICD-10-CM | POA: Insufficient documentation

## 2020-08-11 NOTE — Assessment & Plan Note (Signed)
Modest weight loss.  She is focused on continued diet and exercise.

## 2020-08-11 NOTE — Assessment & Plan Note (Signed)
Very good control.  Labs ordered.

## 2020-08-11 NOTE — Assessment & Plan Note (Signed)
Labs last year were reassuring and do not suggest nutritional problem leading to neuropathy.

## 2020-08-11 NOTE — Progress Notes (Signed)
    SUBJECTIVE:   CHIEF COMPLAINT / HPI:   Annual exam.   Only complaints are facial flushing and tingling of right 4th and 3rd toes.   1. Facial flushing.  "like before when my thyroid was out of whack."  Post menopausal.  Does not attribute to hot flashes. 2. Right 3rd and 4th toes tingling for several weeks.  No back pain.  No leg pain.  No other tingling.  Chronic problems Morbid obesity.  S/P wt loss surgery.  Wt is down a bit.  Patient had sig lab work to monitor last year.  Wants screened for DM. HBP: No chest pain, SOB or headache.  No lightheaded spells. HPDP Patient is due for PAP.  Previous paps normal.  Patient has had COVID.  Otherwise up to date.   PERTINENT  PMH / PSH: Denies change in bowel, bladder, appetite.  No bleeding, abd pain, or focal neuro sx.  No symptoms of anxiety or depression.  OBJECTIVE:   BP 104/76   Pulse (!) 104   Ht 5\' 8"  (1.727 m)   Wt 289 lb 12.8 oz (131.5 kg)   LMP 04/29/2016   SpO2 97%   BMI 44.06 kg/m   HEENT WNL Neck thryoidectomy scar Lungs Clear Cardiac RRR without m or g Abd benign. Pelvic WNL Pap smear taken Neuro, normal gait, cognition, affect, motor and sensory  ASSESSMENT/PLAN:   Preventative health care Healthy female.  Major risk factor is weight.  PAP smear taken   Post-surgical hypothyroidism Check TSH in light of facial flushing.    Morbid obesity (HCC) Modest weight loss.  She is focused on continued diet and exercise.  Lap Roux Y Gastric Bypass Sept 2014 Labs last year were reassuring and do not suggest nutritional problem leading to neuropathy.  Hypertension Very good control.  Labs ordered.     01-20-2000, MD [facility]

## 2020-08-11 NOTE — Assessment & Plan Note (Signed)
Check TSH in light of facial flushing.

## 2020-08-11 NOTE — Assessment & Plan Note (Signed)
Healthy female.  Major risk factor is weight.  PAP smear taken

## 2020-08-12 ENCOUNTER — Other Ambulatory Visit: Payer: Self-pay

## 2020-08-12 DIAGNOSIS — E890 Postprocedural hypothyroidism: Secondary | ICD-10-CM

## 2020-08-12 DIAGNOSIS — I10 Essential (primary) hypertension: Secondary | ICD-10-CM

## 2020-08-13 LAB — CMP14+EGFR
ALT: 20 IU/L (ref 0–32)
AST: 20 IU/L (ref 0–40)
Albumin/Globulin Ratio: 1.3 (ref 1.2–2.2)
Albumin: 3.9 g/dL (ref 3.8–4.9)
Alkaline Phosphatase: 91 IU/L (ref 48–121)
BUN/Creatinine Ratio: 18 (ref 9–23)
BUN: 14 mg/dL (ref 6–24)
Bilirubin Total: 0.4 mg/dL (ref 0.0–1.2)
CO2: 27 mmol/L (ref 20–29)
Calcium: 9.6 mg/dL (ref 8.7–10.2)
Chloride: 102 mmol/L (ref 96–106)
Creatinine, Ser: 0.77 mg/dL (ref 0.57–1.00)
GFR calc Af Amer: 100 mL/min/{1.73_m2} (ref >59)
GFR calc non Af Amer: 87 mL/min/{1.73_m2} (ref >59)
Globulin, Total: 2.9 g/dL (ref 1.5–4.5)
Glucose: 78 mg/dL (ref 65–99)
Potassium: 3.6 mmol/L (ref 3.5–5.2)
Sodium: 140 mmol/L (ref 134–144)
Total Protein: 6.8 g/dL (ref 6.0–8.5)

## 2020-08-13 LAB — CBC
Hematocrit: 36.4 % (ref 34.0–46.6)
Hemoglobin: 12.4 g/dL (ref 11.1–15.9)
MCH: 31.6 pg (ref 26.6–33.0)
MCHC: 34.1 g/dL (ref 31.5–35.7)
MCV: 93 fL (ref 79–97)
Platelets: 257 10*3/uL (ref 150–450)
RBC: 3.93 x10E6/uL (ref 3.77–5.28)
RDW: 13.1 % (ref 11.7–15.4)
WBC: 7.8 10*3/uL (ref 3.4–10.8)

## 2020-08-13 LAB — TSH: TSH: 1.73 u[IU]/mL (ref 0.450–4.500)

## 2020-08-13 LAB — LIPID PANEL
Chol/HDL Ratio: 3.4 ratio (ref 0.0–4.4)
Cholesterol, Total: 182 mg/dL (ref 100–199)
HDL: 53 mg/dL (ref >39)
LDL Chol Calc (NIH): 110 mg/dL — ABNORMAL HIGH (ref 0–99)
Triglycerides: 107 mg/dL (ref 0–149)
VLDL Cholesterol Cal: 19 mg/dL (ref 5–40)

## 2020-08-15 LAB — CYTOLOGY - PAP
Adequacy: ABSENT
Comment: NEGATIVE
Diagnosis: NEGATIVE
High risk HPV: NEGATIVE

## 2020-10-07 ENCOUNTER — Ambulatory Visit: Payer: Self-pay | Attending: Internal Medicine

## 2020-10-07 DIAGNOSIS — Z23 Encounter for immunization: Secondary | ICD-10-CM

## 2020-10-07 NOTE — Progress Notes (Signed)
   Covid-19 Vaccination Clinic  Name:  JUDITHE KEETCH    MRN: [id number] DOB: March 22, 1966  10/07/2020  Ms. Henkin was observed post Covid-19 immunization for 15 minutes without incident. She was provided with Vaccine Information Sheet and instruction to access the V-Safe system.   Ms. Sandeen was instructed to call 911 with any severe reactions post vaccine: Marland Kitchen Difficulty breathing  . Swelling of face and throat  . A fast heartbeat  . A bad rash all over body  . Dizziness and weakness

## 2020-10-24 ENCOUNTER — Telehealth: Payer: Self-pay | Admitting: *Deleted

## 2020-10-24 DIAGNOSIS — G4730 Sleep apnea, unspecified: Secondary | ICD-10-CM

## 2020-10-24 NOTE — Telephone Encounter (Signed)
Patient left message on referral line asking for an sleep study to be ordered.  Patient states that it had been over 5 years since the last one and would like to have an updated one performed.  Order pended and will send to MD.  Burnard Hawthorne

## 2020-10-24 NOTE — Telephone Encounter (Signed)
Ordered as requested.

## 2020-12-25 ENCOUNTER — Ambulatory Visit (HOSPITAL_BASED_OUTPATIENT_CLINIC_OR_DEPARTMENT_OTHER): Payer: Self-pay | Attending: Family Medicine | Admitting: Internal Medicine

## 2020-12-26 ENCOUNTER — Other Ambulatory Visit: Payer: Self-pay

## 2020-12-26 DIAGNOSIS — Z20822 Contact with and (suspected) exposure to covid-19: Secondary | ICD-10-CM

## 2020-12-28 LAB — SARS-COV-2, NAA 2 DAY TAT

## 2020-12-28 LAB — NOVEL CORONAVIRUS, NAA: SARS-CoV-2, NAA: DETECTED — AB

## 2020-12-29 ENCOUNTER — Telehealth: Payer: Self-pay | Admitting: Oncology

## 2020-12-29 NOTE — Telephone Encounter (Signed)
Called to discuss with patient about COVID-19 symptoms and the use of one of the available treatments for those with mild to moderate Covid symptoms and at a high risk of hospitalization.  Pt appears to qualify for outpatient treatment due to co-morbid conditions and/or a member of an at-risk group in accordance with the FDA Emergency Use Authorization.    Symptom onset:12/21/2020 Vaccinated: No Booster? No Immunocompromised? No  Qualifiers: NO   Paula Austin does not qualify for the infusion given her symptom onset has been greater than 7 days.  She is feeling tremendously better.  Mauro Kaufmann

## 2021-04-12 ENCOUNTER — Encounter (HOSPITAL_COMMUNITY): Payer: Self-pay

## 2021-04-12 ENCOUNTER — Ambulatory Visit (HOSPITAL_COMMUNITY)
Admission: EM | Admit: 2021-04-12 | Discharge: 2021-04-12 | Disposition: A | Discharge status: Home / Self Care | Payer: 59 | Attending: Emergency Medicine | Admitting: Emergency Medicine

## 2021-04-12 DIAGNOSIS — S93402A Sprain of unspecified ligament of left ankle, initial encounter: Secondary | ICD-10-CM | POA: Diagnosis not present

## 2021-04-12 DIAGNOSIS — M25572 Pain in left ankle and joints of left foot: Secondary | ICD-10-CM | POA: Diagnosis not present

## 2021-04-12 MED ORDER — MELOXICAM 7.5 MG PO TABS: 7.5000 mg | ORAL_TABLET | Freq: Every day | ORAL | 0 refills | Status: DC

## 2021-04-12 NOTE — ED Notes (Signed)
Pt states she just took BP medication in waiting area.

## 2021-04-12 NOTE — ED Triage Notes (Signed)
Pt c/o left ankle and foot pain with swelling. Pt states she has tried ice packs and elevating with no relief. She states she feels uncomfortable walking on it.

## 2021-04-12 NOTE — Discharge Instructions (Addendum)
Take the Mobic daily for the next 2 weeks.  After that, you can take Ibuprofen or Naproxen as needed for pain.   Rest as much as possible Ice for 10-15 minutes every 4-6 hours as needed for pain and swelling Compression- use an ace bandage or splint especially when walking/working  Elevate above your hip when sitting and standing  Follow up with sports medicine or orthopedics if symptoms do not improve in the next week.

## 2021-04-12 NOTE — ED Provider Notes (Signed)
[facility]    CSN: [id number] Arrival date & time: 04/12/21  0957      History   Chief Complaint Chief Complaint  Patient presents with  . Joint Swelling    ankles  . Ankle Pain    HPI Paula Austin is a 55 y.o. female.   Patient here for evaluation of left ankle pain and swelling that has been ongoing for the past 3 weeks.  Patient reports seeing ibuprofen and Aleve with minimal relief.  Also reports wearing a splint periodically.  Reports pain to back of foot and heel.  Pain is throbbing and worse with movement. Denies any trauma, injury, or other precipitating event. Denies any fevers, chest pain, shortness of breath, N/V/D, numbness, tingling, weakness, abdominal pain, or headaches.   ROS: As per HPI, all other pertinent ROS negative    The history is provided by the patient.    Past Medical History:  Diagnosis Date  . Difficult intubation 1999   "had thick neck" , trouble with intubatuion sept sept 2014 also  . GERD (gastroesophageal reflux disease)    occasional, none lately  . Goiter   . History of kidney stones   . Hyperlipidemia   . Hypertension   . Hypothyroidism   . Morbid obesity (HCC)   . OSA on CPAP    uses cpap some nights, pt does know settings  . PONV (postoperative nausea and vomiting) sept 2014   lots of coughing up phelgm  . Sleep apnea   . Thyroid disease     Patient Active Problem List   Diagnosis Date Noted  . Preventative health care 08/11/2020  . Constipation 01/04/2020  . 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 Hypertension 05/02/2011  . Sleep apnea 05/02/2011  . Morbid obesity (HCC) 05/02/2011    Past Surgical History:  Procedure Laterality Date  . BREAST SURGERY Bilateral 11/15/06   Reduction  . BREATH TEK H PYLORI N/A 05/26/2013   Procedure: BREATH TEK H PYLORI;  Surgeon: Valarie Merino, MD;  Location: [facility];  Service: General;  Laterality: N/A;  .  COLONOSCOPY WITH PROPOFOL N/A 06/17/2014   Procedure: COLONOSCOPY WITH PROPOFOL;  Surgeon: Rachael Fee, MD;  Location: [facility];  Service: Endoscopy;  Laterality: N/A;  . GASTRIC ROUX-EN-Y N/A 09/07/2013   Procedure: LAPAROSCOPIC ROUX-EN-Y GASTRIC upper endoscopy;  Surgeon: Valarie Merino, MD;  Location: [facility];  Service: General;  Laterality: N/A;  . THYROIDECTOMY  10/30/02  . TUBAL LIGATION  08/25/98   had hard time getting ET tube down per pt.     OB History   No obstetric history on file.      Home Medications    Prior to Admission medications   Medication Sig Start Date End Date Taking? Authorizing Provider  meloxicam (MOBIC) 7.5 MG tablet Take 1 tablet (7.5 mg total) by mouth daily. 04/12/21  Yes Ivette Loyal, NP  cyanocobalamin 1000 MCG tablet Take 1,000 mcg by mouth daily.     [provider]  levothyroxine (SYNTHROID) 125 MCG tablet Take 1 tablet (125 mcg total) by mouth daily before breakfast. 07/18/20   Hensel, Santiago Bumpers, MD  lisinopril-hydrochlorothiazide (ZESTORETIC) 20-25 MG tablet Take 1 tablet by mouth daily. 07/18/20   Moses Manners, MD  Probiotic Product (PROBIOTIC-10 PO) Take by mouth daily.    [provider]    Family History Family History  Problem Relation Age of Onset  .  Diabetes Father   . Diabetes Paternal Grandmother   . Cancer Maternal Grandfather        lung  . Colon cancer Neg Hx     Social History Social History   Tobacco Use  . Smoking status: Never Smoker  . Smokeless tobacco: Never Used  Substance Use Topics  . Alcohol use: Yes    Comment: occasional social  . Drug use: No     Allergies   Oxycodone   Review of Systems Review of Systems  Musculoskeletal: Positive for joint swelling and myalgias.  All other systems reviewed and are negative.    Physical Exam Triage Vital Signs ED Triage Vitals  Enc Vitals Group     BP 04/12/21 1140 (!) 137/96     Pulse Rate 04/12/21 1140 73     Resp 04/12/21  1140 19     Temp 04/12/21 1140 98.4 F (36.9 C)     Temp Source 04/12/21 1140 Oral     SpO2 04/12/21 1140 100 %     Weight --      Height --      Head Circumference --      Peak Flow --      Pain Score 04/12/21 1139 6     Pain Loc --      Pain Edu? --      Excl. in GC? --    No data found.  Updated Vital Signs BP (!) 137/96 (BP Location: Right Arm)   Pulse 73   Temp 98.4 F (36.9 C) (Oral)   Resp 19   LMP 04/29/2016   SpO2 100%   Visual Acuity Right Eye Distance:   Left Eye Distance:   Bilateral Distance:    Right Eye Near:   Left Eye Near:    Bilateral Near:     Physical Exam Vitals and nursing note reviewed.  Constitutional:      General: She is not in acute distress.    Appearance: Normal appearance. She is not ill-appearing, toxic-appearing or diaphoretic.  HENT:     Head: Normocephalic and atraumatic.  Eyes:     Conjunctiva/sclera: Conjunctivae normal.  Cardiovascular:     Rate and Rhythm: Normal rate.     Pulses: Normal pulses.  Pulmonary:     Effort: Pulmonary effort is normal.  Abdominal:     General: Abdomen is flat.  Musculoskeletal:        General: Normal range of motion.     Cervical back: Normal range of motion.     Left ankle: Swelling present. No deformity, ecchymosis or lacerations. Tenderness present over the CF ligament and posterior TF ligament. Normal range of motion. Anterior drawer test negative.     Left Achilles Tendon: Normal. No defects. Thompson's test negative.  Skin:    General: Skin is warm and dry.  Neurological:     General: No focal deficit present.     Mental Status: She is alert and oriented to person, place, and time.  Psychiatric:        Mood and Affect: Mood normal.      UC Treatments / Results  Labs (all labs ordered are listed, but only abnormal results are displayed) Labs Reviewed - No data to display  EKG   Radiology No results found.  Procedures Procedures (including critical care  time)  Medications Ordered in UC Medications - No data to display  Initial Impression / Assessment and Plan / UC Course  I have reviewed the triage vital  signs and the nursing notes.  Pertinent labs & imaging results that were available during my care of the patient were reviewed by me and considered in my medical decision making (see chart for details).     Ankle sprain acute left ankle pain.  Assessment negative for red flags or concerns.  Prescribed Mobic daily for the next 14 days.  May take Tylenol as needed for pain.  Recommend rest and ice.  Recommend wearing a ankle splint/brace or Ace bandage while walking and standing.  If symptoms do not improve in the next week recommend following up with orthopedic or sports medicine.   Final Clinical Impressions(s) / UC Diagnoses   Final diagnoses:  Sprain of left ankle, unspecified ligament, initial encounter  Acute left ankle pain     Discharge Instructions     Take the Mobic daily for the next 2 weeks.  After that, you can take Ibuprofen or Naproxen as needed for pain.   Rest as much as possible Ice for 10-15 minutes every 4-6 hours as needed for pain and swelling Compression- use an ace bandage or splint especially when walking/working  Elevate above your hip when sitting and standing  Follow up with sports medicine or orthopedics if symptoms do not improve in the next week.      ED Prescriptions    Medication Sig Dispense Auth. Provider   meloxicam (MOBIC) 7.5 MG tablet Take 1 tablet (7.5 mg total) by mouth daily. 14 tablet Ivette Loyal, NP     PDMP not reviewed this encounter.   Ivette Loyal, NP 04/12/21 1233

## 2021-07-12 ENCOUNTER — Other Ambulatory Visit: Payer: Self-pay | Admitting: Family Medicine

## 2021-07-12 DIAGNOSIS — I10 Essential (primary) hypertension: Secondary | ICD-10-CM

## 2021-09-18 ENCOUNTER — Other Ambulatory Visit: Payer: Self-pay

## 2021-09-18 ENCOUNTER — Encounter: Payer: Self-pay | Admitting: Family Medicine

## 2021-09-18 ENCOUNTER — Ambulatory Visit (INDEPENDENT_AMBULATORY_CARE_PROVIDER_SITE_OTHER): Payer: 59 | Admitting: Family Medicine

## 2021-09-18 VITALS — BP 125/89 | HR 81 | Ht 68.0 in | Wt 291.8 lb

## 2021-09-18 DIAGNOSIS — E890 Postprocedural hypothyroidism: Secondary | ICD-10-CM

## 2021-09-18 DIAGNOSIS — Z23 Encounter for immunization: Secondary | ICD-10-CM

## 2021-09-18 DIAGNOSIS — Z0000 Encounter for general adult medical examination without abnormal findings: Secondary | ICD-10-CM | POA: Diagnosis not present

## 2021-09-18 DIAGNOSIS — I10 Essential (primary) hypertension: Secondary | ICD-10-CM

## 2021-09-18 DIAGNOSIS — R1013 Epigastric pain: Secondary | ICD-10-CM | POA: Diagnosis not present

## 2021-09-18 DIAGNOSIS — N951 Menopausal and female climacteric states: Secondary | ICD-10-CM

## 2021-09-18 MED ORDER — FAMOTIDINE 40 MG PO TABS: 40.0000 mg | ORAL_TABLET | Freq: Every day | ORAL | 3 refills | Status: DC

## 2021-09-18 NOTE — Patient Instructions (Addendum)
Please get a bivalent COVID booster.   You are due for the Shingles vaccine - a series of two shots.  Let me know if you need a prescription.  Also let me know when you get any shot at the drug store so that I can update my records.   I will call with lab results Someone should call about the gall bladder ultrasound. If the menopausal symptoms get worse, you can try soy pills or foods.  You can also try [employer].   I sent in a prescription for pepcid which you should take regularly. Good luck with your son.

## 2021-09-19 LAB — LIPID PANEL
Chol/HDL Ratio: 3.2 ratio (ref 0.0–4.4)
Cholesterol, Total: 188 mg/dL (ref 100–199)
HDL: 58 mg/dL (ref >39)
LDL Chol Calc (NIH): 118 mg/dL — ABNORMAL HIGH (ref 0–99)
Triglycerides: 64 mg/dL (ref 0–149)
VLDL Cholesterol Cal: 12 mg/dL (ref 5–40)

## 2021-09-19 LAB — CMP14+EGFR
ALT: 16 IU/L (ref 0–32)
AST: 16 IU/L (ref 0–40)
Albumin/Globulin Ratio: 1.5 (ref 1.2–2.2)
Albumin: 4.1 g/dL (ref 3.8–4.9)
Alkaline Phosphatase: 99 IU/L (ref 44–121)
BUN/Creatinine Ratio: 17 (ref 9–23)
BUN: 12 mg/dL (ref 6–24)
Bilirubin Total: 0.4 mg/dL (ref 0.0–1.2)
CO2: 23 mmol/L (ref 20–29)
Calcium: 9.5 mg/dL (ref 8.7–10.2)
Chloride: 105 mmol/L (ref 96–106)
Creatinine, Ser: 0.7 mg/dL (ref 0.57–1.00)
Globulin, Total: 2.8 g/dL (ref 1.5–4.5)
Glucose: 94 mg/dL (ref 70–99)
Potassium: 4.7 mmol/L (ref 3.5–5.2)
Sodium: 141 mmol/L (ref 134–144)
Total Protein: 6.9 g/dL (ref 6.0–8.5)
eGFR: 101 mL/min/{1.73_m2} (ref >59)

## 2021-09-19 LAB — CBC
Hematocrit: 38.7 % (ref 34.0–46.6)
Hemoglobin: 12.7 g/dL (ref 11.1–15.9)
MCH: 30.1 pg (ref 26.6–33.0)
MCHC: 32.8 g/dL (ref 31.5–35.7)
MCV: 92 fL (ref 79–97)
Platelets: 256 10*3/uL (ref 150–450)
RBC: 4.22 x10E6/uL (ref 3.77–5.28)
RDW: 12.9 % (ref 11.7–15.4)
WBC: 6 10*3/uL (ref 3.4–10.8)

## 2021-09-19 LAB — TSH: TSH: 2.26 u[IU]/mL (ref 0.450–4.500)

## 2021-09-19 LAB — LIPASE: Lipase: 31 U/L (ref 14–72)

## 2021-09-20 ENCOUNTER — Encounter: Payer: Self-pay | Admitting: Family Medicine

## 2021-09-20 NOTE — Assessment & Plan Note (Signed)
Check CBC and CMP - both reassuring.  Also check RUQ ultrasound due to concern for GB pathology.

## 2021-09-20 NOTE — Assessment & Plan Note (Signed)
Check labs.  Labs all reassuring.  10 y ASCVD risk is <5%

## 2021-09-20 NOTE — Assessment & Plan Note (Signed)
TSH checked and normal.

## 2021-09-20 NOTE — Assessment & Plan Note (Signed)
Diet and exercise.  Her weight is her main risk factor.

## 2021-09-20 NOTE — Assessment & Plan Note (Signed)
Symptoms do not bother her enough to consider medications.  Discussed possible OTC meds.

## 2021-09-20 NOTE — Assessment & Plan Note (Signed)
Generally healthy female with no at risk behaviors. 

## 2021-09-20 NOTE — Progress Notes (Signed)
    SUBJECTIVE:   CHIEF COMPLAINT / HPI:   Annual physical: Acute/subacute issues Considerable increase in indigestion.  Burning epigastric pain.  Relieved when she occasionally takes OTC famotidine.  No nausea, vomiting or weight loss.  Patient also has sharp left ant  chest pain that radiates to the back, which she believes is associated with this indigestion.  It comes and goes with the heartburn.  Does not radiate to neck or arms.  No associated diaphresis or nausea.  Review of old records.  She had possible gall bladder polyps on a 2014 exam.  No follow up since. Hot flashes.  Worse recently.  Guesses that it has been two years since her last menstrual period.  No bleeding.  No weight loss, fever or weakness. Chronic issues. Hypertension. Tolerating lisinopril/HCTZ well.  No SOB.  CP as above. Post surgical hypothyroidism.  On replacement therapy.  Feels fine. Morbid obesity.  S/P bariatric surgery.  Weight has been stable for several years.   HPDP: Will get flu shot today Due for COVID bivalent booster and Shingrix.   Due for lipids Otherwise up to date. Exercises 2 hours per week. Generally healthy diet.   No at-risk behaviors.    PERTINENT  PMH / PSH: No swelling, weakness, worrisome skin lesions.  Denies change in bowel, bladder, appetitie or weight.  OBJECTIVE:   BP 125/89   Pulse 81   Ht 5\' 8"  (1.727 m)   Wt 291 lb 12.8 oz (132.4 kg)   LMP 04/29/2016   SpO2 98%   BMI 44.37 kg/m   HEENT WNL Neck supple Lungs clear Cardiac RRR without m or g.  No chest wall tenderness Abd,  epigastric tenderness to deep palpation.  Suggestion of more discomfort in Right UQ than left. Ext No edema. Neuro: Grossly normal motor, sensory, gait, cognition and affect.  ASSESSMENT/PLAN:   Preventative health care Generally healthy female with no at risk behaviors.    Hypertension Check labs.  Labs all reassuring.  10 y ASCVD risk is <5%  Dyspepsia Check CBC and CMP - both  reassuring.  Also check RUQ ultrasound due to concern for GB pathology.    Post-surgical hypothyroidism TSH checked and normal.  Morbid obesity (HCC) Diet and exercise.  Her weight is her main risk factor.  Menopausal syndrome Symptoms do not bother her enough to consider medications.  Discussed possible OTC meds.     05/01/2016, MD [facility]

## 2021-09-21 ENCOUNTER — Ambulatory Visit
Admission: RE | Admit: 2021-09-21 | Discharge: 2021-09-21 | Disposition: A | Discharge status: Home / Self Care | Payer: 59 | Source: Ambulatory Visit | Attending: Family Medicine | Admitting: Family Medicine

## 2021-09-21 DIAGNOSIS — R1013 Epigastric pain: Secondary | ICD-10-CM

## 2021-09-26 ENCOUNTER — Telehealth: Payer: Self-pay | Admitting: Family Medicine

## 2021-09-26 DIAGNOSIS — K8020 Calculus of gallbladder without cholecystitis without obstruction: Secondary | ICD-10-CM | POA: Insufficient documentation

## 2021-09-26 NOTE — Telephone Encounter (Signed)
Caleed and discussed.  No symptoms of classic biliary colic.  Indigestion has improved by taking famotidine regularly.  She elects to watch and see at this point.  She will contact me for worsening abd pain, which would prompt a surgical referral.

## 2022-02-09 DIAGNOSIS — Z1231 Encounter for screening mammogram for malignant neoplasm of breast: Secondary | ICD-10-CM | POA: Diagnosis not present

## 2022-02-28 ENCOUNTER — Encounter: Payer: Self-pay | Admitting: Family Medicine

## 2022-05-22 ENCOUNTER — Encounter: Payer: Self-pay | Admitting: *Deleted

## 2022-07-03 ENCOUNTER — Other Ambulatory Visit: Payer: Self-pay | Admitting: Family Medicine

## 2022-07-03 DIAGNOSIS — I10 Essential (primary) hypertension: Secondary | ICD-10-CM

## 2022-07-09 ENCOUNTER — Ambulatory Visit (INDEPENDENT_AMBULATORY_CARE_PROVIDER_SITE_OTHER): Payer: BC Managed Care – PPO | Admitting: Student

## 2022-07-09 ENCOUNTER — Encounter: Payer: Self-pay | Admitting: Student

## 2022-07-09 VITALS — BP 146/88 | Ht 68.0 in | Wt 294.0 lb

## 2022-07-09 DIAGNOSIS — M7632 Iliotibial band syndrome, left leg: Secondary | ICD-10-CM

## 2022-07-09 DIAGNOSIS — M7660 Achilles tendinitis, unspecified leg: Secondary | ICD-10-CM

## 2022-07-09 HISTORY — DX: Iliotibial band syndrome, left leg: M76.32

## 2022-07-09 HISTORY — DX: Achilles tendinitis, unspecified leg: M76.60

## 2022-07-09 NOTE — Assessment & Plan Note (Addendum)
Suspect secondary to Achilles tendinopathy as below. Believe this is ultimately a movement problem and will need to be addressed by a movement specialist.  - Referral to physical therapy and sports medicine  - Icing as tolerated - Activity as tolerated - Ibuprofen and Tylenol PRN

## 2022-07-09 NOTE — Progress Notes (Signed)
    SUBJECTIVE:   CHIEF COMPLAINT / HPI:   L Knee Pain  L Ankle Pain Patient has a longstanding issue with recurrent L ankle pain along her Achilles tendon. She usually self-manages these episodes with a brace and stretching exercises and the pain will abate in a few days/weeks. The present episode has been ongoing >1 month. She presents today with one month of L knee pain in addition to her achilles pain. Pain localizes to the lateral aspect of her knee and is worse with activity, better with rest. Has been treating with ibuprofen/tylenol with little improvement. She has not had an acute injury to either area and denies any recent change in activity level/type.  Trying to get more active and get back into walking for exercise and going to the gym but limited by pain.    OBJECTIVE:   BP (!) 146/88   Ht 5\' 8"  (1.727 m)   Wt 294 lb (133.4 kg)   LMP 04/29/2016   BMI 44.70 kg/m   Physical Exam Musculoskeletal:     Right knee: Normal.     Left knee: No deformity, effusion, erythema or crepitus. Normal range of motion. Tenderness present. Normal alignment.     Right ankle: Normal.     Left ankle: Swelling present. No deformity.     Left Achilles Tendon: Tenderness present. Thompson's test negative.     Comments: Knees are both 45cm in circumference. There is tenderness along the IT band over the L knee. No deformity or effusion. FROM in bilateral knees. No obvious laxity, though exam limity by body habitus.  L ankle tender over Achilles, FROM but pain reproduced with dorsiflexion.       ASSESSMENT/PLAN:   Non-insertional Achilles tendinopathy Suspect that this is the ultimate cause of her knee pain secondary to compensatory gait changes. Responsive to bracing in the past but prolonged course with present episode.  - Gave patient Achilles rehab exercises - Tylenol/Ibuprofen PRN - Icing as tolerated - Referral to Sports Med and Physical therapy  It band syndrome, left Suspect  secondary to Achilles tendinopathy as below. Believe this is ultimately a movement problem and will need to be addressed by a movement specialist.  - Referral to physical therapy and sports medicine  - Icing as tolerated - Activity as tolerated - Ibuprofen and Tylenol PRN     05/01/2016, MD [facility]

## 2022-07-09 NOTE — Assessment & Plan Note (Addendum)
Suspect that this is the ultimate cause of her knee pain secondary to compensatory gait changes. Responsive to bracing in the past but prolonged course with present episode.  - Gave patient Achilles rehab exercises - Tylenol/Ibuprofen PRN - Icing as tolerated - Referral to Sports Med and Physical therapy

## 2022-07-09 NOTE — Patient Instructions (Signed)
Ms. Heath, What a pleasure to meet you again! I think you have two things going on: An achilles tendinopathy and an IT band syndrome in your knee. I think that the ankle problems are causing the knee problems. I'd like to get movement specialists on board for what I believe is a movement problem. Therefore I am referring you to our sports med docs and our physical therapists- they will call you to set up appointments. If you havent heard from them in the next 10 days, call us.   Dorothyann Gibbs, MD

## 2022-07-12 ENCOUNTER — Encounter: Payer: Self-pay | Admitting: Specialist

## 2022-07-12 ENCOUNTER — Ambulatory Visit: Payer: Self-pay

## 2022-07-12 ENCOUNTER — Ambulatory Visit (INDEPENDENT_AMBULATORY_CARE_PROVIDER_SITE_OTHER): Payer: BC Managed Care – PPO | Admitting: Specialist

## 2022-07-12 VITALS — BP 125/82 | HR 71 | Ht 67.0 in | Wt 280.0 lb

## 2022-07-12 DIAGNOSIS — G8929 Other chronic pain: Secondary | ICD-10-CM | POA: Diagnosis not present

## 2022-07-12 DIAGNOSIS — M419 Scoliosis, unspecified: Secondary | ICD-10-CM

## 2022-07-12 DIAGNOSIS — M7989 Other specified soft tissue disorders: Secondary | ICD-10-CM

## 2022-07-12 DIAGNOSIS — M25562 Pain in left knee: Secondary | ICD-10-CM | POA: Diagnosis not present

## 2022-07-12 DIAGNOSIS — M1712 Unilateral primary osteoarthritis, left knee: Secondary | ICD-10-CM | POA: Diagnosis not present

## 2022-07-12 DIAGNOSIS — M79662 Pain in left lower leg: Secondary | ICD-10-CM

## 2022-07-12 DIAGNOSIS — M7662 Achilles tendinitis, left leg: Secondary | ICD-10-CM

## 2022-07-12 MED ORDER — TRAMADOL HCL 50 MG PO TABS: 50.0000 mg | ORAL_TABLET | Freq: Four times a day (QID) | ORAL | 0 refills | Status: DC | PRN

## 2022-07-12 MED ORDER — ACETAMINOPHEN 500 MG PO TABS: 500.0000 mg | ORAL_TABLET | ORAL | 0 refills | Status: DC | PRN

## 2022-07-12 MED ORDER — BUPIVACAINE HCL 0.5 % IJ SOLN
3.0000 mL | INTRAMUSCULAR | Status: AC | PRN
  Administered 2022-07-12: 3 mL via INTRA_ARTICULAR

## 2022-07-12 MED ORDER — METHYLPREDNISOLONE ACETATE 40 MG/ML IJ SUSP
40.0000 mg | INTRAMUSCULAR | Status: AC | PRN
  Administered 2022-07-12: 40 mg via INTRA_ARTICULAR

## 2022-07-12 NOTE — Progress Notes (Addendum)
Office Visit Note   Patient: Paula Austin           Date of Birth: [birth year]           MRN: [id number] Visit Date: 07/12/2022              Requested by: Moses Manners, MD [address] [place],  [place] [address] PCP: Moses Manners, MD   Assessment & Plan: Visit Diagnoses:  1. Chronic pain of left knee   2. Pain and swelling of left lower leg   3. Unilateral primary osteoarthritis, left knee   4. Scoliosis of thoracolumbar spine, unspecified scoliosis type   5. Achilles tendinitis of left lower extremity   6. Left Achilles bursitis   7. Pain and swelling of lower leg, left    Plan: Knee is suffering from osteoarthritis, only real proven treatments are Weight loss, NSIADs like diclofenac and exercise. Well padded shoes help. Ice the knee that is suffering from osteoarthritis. Ice the knee 2-3 times a day 15-20 mins at a time.-3 times a day 15-20 mins at a time. Hot showers in the AM.   Hemp CBD capsules, amazon.com 5,000-7,000 mg per bottle, 60 capsules per bottle, take one capsule twice a day. Cane in the left hand to use with left leg weight bearing. An Ultrasound test of the left leg is ordered to assess for Deep Venous Thrombosus or a blood clot in the vein of the left leg. Take baby aspirin 81 mg twice a day. Start diclofenac 50 mg BID orTIDcc.  Follow-Up Instructions: No follow-ups on file.    Follow-Up Instructions: Return in about 2 weeks (around 07/26/2022).   Orders:  Orders Placed This Encounter  Procedures  . XR KNEE 3 VIEW LEFT  . VAS [place] LOWER EXTREMITY VENOUS (DVT)   No orders of the defined types were placed in this encounter.     Procedures: Large Joint Inj: L knee on 07/12/2022 5:29 PM Indications: pain Details: 25 G 1.5 in needle, anterolateral approach  Arthrogram: No  Medications: 40 mg methylPREDNISolone acetate 40 MG/ML; 3 mL bupivacaine 0.5 % Outcome: tolerated well, no immediate complications Procedure, treatment  alternatives, risks and benefits explained, specific risks discussed. Consent was given by the patient. Immediately prior to procedure a time out was called to verify the correct patient, procedure, equipment, support staff and site/side marked as required. Patient was prepped and draped in the usual sterile fashion.     Clinical Data: No additional findings.   Subjective: Chief Complaint  Patient presents with  . Left Knee - Pain    56 year old female with 1 month history of left leg and thigh pain. She relates pain started in the left heel and she was told that she has achilles tendon bursitis. She relates being unable to be a   Review of Systems   Objective: Vital Signs: BP 125/82   Pulse 71   Ht 5\' 7"  (1.702 m)   Wt 280 lb (127 kg)   LMP 04/29/2016   BMI 43.85 kg/m   Physical Exam  Ortho Exam  Specialty Comments:  No specialty comments available.  Imaging: No results found.   PMFS History: Patient Active Problem List   Diagnosis Date Noted  . Non-insertional Achilles tendinopathy 07/09/2022  . It band syndrome, left 07/09/2022  . Cholelithiasis 09/26/2021  . Dyspepsia 09/18/2021  . Preventative health care 08/11/2020  . Constipation 01/04/2020  . 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  Menopausal syndrome 05/22/2013  . 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 Hypertension 05/02/2011  . Sleep apnea 05/02/2011  . Morbid obesity (HCC) 05/02/2011   Past Medical History:  Diagnosis Date  . Difficult intubation 1999   "had thick neck" , trouble with intubatuion sept sept 2014 also  . GERD (gastroesophageal reflux disease)    occasional, none lately  . Goiter   . History of kidney stones   . Hyperlipidemia   . Hypertension   . Hypothyroidism   . Morbid obesity (HCC)   . OSA on CPAP    uses cpap some nights, pt does know settings  . PONV (postoperative nausea and vomiting) sept 2014   lots of coughing up phelgm  . Sleep apnea   .  Thyroid disease     Family History  Problem Relation Age of Onset  . Diabetes Father   . Diabetes Paternal Grandmother   . Cancer Maternal Grandfather        lung  . Colon cancer Neg Hx     Past Surgical History:  Procedure Laterality Date  . BREAST SURGERY Bilateral 11/15/06   Reduction  . BREATH TEK H PYLORI N/A 05/26/2013   Procedure: BREATH TEK H PYLORI;  Surgeon: Valarie Merino, MD;  Location: [facility];  Service: General;  Laterality: N/A;  . COLONOSCOPY WITH PROPOFOL N/A 06/17/2014   Procedure: COLONOSCOPY WITH PROPOFOL;  Surgeon: Rachael Fee, MD;  Location: [facility];  Service: Endoscopy;  Laterality: N/A;  . GASTRIC ROUX-EN-Y N/A 09/07/2013   Procedure: LAPAROSCOPIC ROUX-EN-Y GASTRIC upper endoscopy;  Surgeon: Valarie Merino, MD;  Location: [facility];  Service: General;  Laterality: N/A;  . THYROIDECTOMY  10/30/02  . TUBAL LIGATION  08/25/98   had hard time getting ET tube down per pt.    Social History   Occupational History  . Not on file  Tobacco Use  . Smoking status: Never  . Smokeless tobacco: Never  Substance and Sexual Activity  . Alcohol use: Yes    Comment: occasional social  . Drug use: No  . Sexual activity: Not on file

## 2022-07-12 NOTE — Patient Instructions (Addendum)
Plan: Knee is suffering from osteoarthritis, only real proven treatments are Weight loss, arthritis meds like motrin, naproxyn or diclofenac and exercise. You can not take arthritis medications like motrin, naprosyn and diclofenac due to history of gastric bypass.  Instead take tylenol 500 mg po every 4 hours Well padded shoes help. Ice the knee that is suffering from osteoarthritis. Ice the knee 2-3 times a day 15-20 mins at a time.-3 times a day 15-20 mins at a time. Hot showers in the AM.   Hemp CBD capsules, amazon.com 5,000-7,000 mg per bottle, 60 capsules per bottle, take one capsule twice a day. Cane in the left hand to use with left leg weight bearing. An Ultrasound test of the left leg is ordered to assess for Deep Venous Thrombosus or a blood clot in the vein of the left leg. Take baby aspirin 81 mg twice a day. You can not take arthritis medications like motrin, naprosyn and diclofenac due to history of gastric bypass.  Instead take tylenol 500 mg po every 4 hours  Follow-Up Instructions: No follow-ups on file.    Follow-Up Instructions: No follow-ups on file.

## 2022-07-13 ENCOUNTER — Telehealth: Payer: Self-pay | Admitting: *Deleted

## 2022-07-13 ENCOUNTER — Ambulatory Visit (HOSPITAL_COMMUNITY): Payer: BC Managed Care – PPO

## 2022-07-13 NOTE — Telephone Encounter (Signed)
Pt is scheduled for US DVT today at [facility] at 11am, I called pt and left vm with the appt date and for her to return my call to let me know she has received the message

## 2022-07-16 NOTE — Telephone Encounter (Signed)
Pt is rescheduled for 07/18/22 at 9am pt is aware

## 2022-07-17 ENCOUNTER — Ambulatory Visit: Payer: BC Managed Care – PPO | Admitting: Family Medicine

## 2022-07-18 ENCOUNTER — Ambulatory Visit (HOSPITAL_COMMUNITY)
Admission: RE | Admit: 2022-07-18 | Discharge: 2022-07-18 | Disposition: A | Discharge status: Home / Self Care | Payer: BC Managed Care – PPO | Source: Ambulatory Visit | Attending: Specialist | Admitting: Specialist

## 2022-07-18 DIAGNOSIS — M7989 Other specified soft tissue disorders: Secondary | ICD-10-CM | POA: Diagnosis not present

## 2022-07-18 DIAGNOSIS — M79662 Pain in left lower leg: Secondary | ICD-10-CM | POA: Insufficient documentation

## 2022-07-19 ENCOUNTER — Ambulatory Visit: Payer: BC Managed Care – PPO | Admitting: Family Medicine

## 2022-07-25 ENCOUNTER — Ambulatory Visit (INDEPENDENT_AMBULATORY_CARE_PROVIDER_SITE_OTHER): Payer: BC Managed Care – PPO | Admitting: Specialist

## 2022-07-25 ENCOUNTER — Encounter: Payer: Self-pay | Admitting: Specialist

## 2022-07-25 VITALS — BP 143/93 | HR 83 | Ht 67.0 in | Wt 280.0 lb

## 2022-07-25 DIAGNOSIS — M79662 Pain in left lower leg: Secondary | ICD-10-CM | POA: Diagnosis not present

## 2022-07-25 DIAGNOSIS — M7989 Other specified soft tissue disorders: Secondary | ICD-10-CM | POA: Diagnosis not present

## 2022-07-25 DIAGNOSIS — M1712 Unilateral primary osteoarthritis, left knee: Secondary | ICD-10-CM

## 2022-07-25 DIAGNOSIS — M419 Scoliosis, unspecified: Secondary | ICD-10-CM | POA: Diagnosis not present

## 2022-07-25 MED ORDER — BUPIVACAINE HCL 0.5 % IJ SOLN
3.0000 mL | INTRAMUSCULAR | Status: AC | PRN
  Administered 2022-07-25: 3 mL via INTRA_ARTICULAR

## 2022-07-25 MED ORDER — LIDOCAINE HCL 1 % IJ SOLN
5.0000 mL | INTRAMUSCULAR | Status: AC | PRN
  Administered 2022-07-25: 5 mL

## 2022-07-25 MED ORDER — METHYLPREDNISOLONE ACETATE 40 MG/ML IJ SUSP
40.0000 mg | INTRAMUSCULAR | Status: AC | PRN
  Administered 2022-07-25: 40 mg via INTRA_ARTICULAR

## 2022-07-25 NOTE — Progress Notes (Signed)
Office Visit Note   Patient: Paula Austin           Date of Birth: 10/07/1966           MRN: [id number] Visit Date: 07/25/2022              Requested by: Moses Manners, MD [address] [place],  [place] [address] PCP: Moses Manners, MD   Assessment & Plan: Visit Diagnoses:  1. Unilateral primary osteoarthritis, left knee   2. Pain and swelling of left lower leg   3. Scoliosis of thoracolumbar spine, unspecified scoliosis type     Plan: Plan: Knee is suffering from osteoarthritis, only real proven treatments are Weight loss, NSIADs like diclofenac and exercise. Well padded shoes help. Ice the knee that is suffering from osteoarthritis, only real proven treatments are Ice the knee 2-3 times a day 15-20 mins at a time.-3 times a day 15-20 mins at a time. Hot showers in the AM.  Injection with steroid may be of benefit. Hemp CBD capsules, amazon.com 5,000-7,000 mg per bottle, 60 capsules per bottle, take one capsule twice a day. Cane in the left hand to use with left leg weight bearing. Tramadol for pain Follow-Up Instructions: No follow-ups on file.    Follow-Up Instructions: Return in about 3 weeks (around 08/15/2022) for Dr. Magnus Ivan eval and treat left knee severe osteoarthritis.   Orders:  No orders of the defined types were placed in this encounter.  No orders of the defined types were placed in this encounter.     Procedures: Large Joint Inj: L knee on 07/25/2022 9:56 AM Indications: pain Details: 25 G 1.5 in needle, anterolateral approach  Arthrogram: No  Medications: 40 mg methylPREDNISolone acetate 40 MG/ML; 3 mL bupivacaine 0.5 %; 5 mL lidocaine 1 % Outcome: tolerated well, no immediate complications Procedure, treatment alternatives, risks and benefits explained, specific risks discussed. Consent was given by the patient. Immediately prior to procedure a time out was called to verify the correct patient, procedure, equipment, support staff  and site/side marked as required. Patient was prepped and draped in the usual sterile fashion.     Clinical Data: No additional findings.   Subjective: Chief Complaint  Patient presents with  . Left Leg - Follow-up    56 year old female with left knee pain. Start pain, AM stiffness pain with initiating standing and walking after sitting  for a period of time. No bowel or bladder difficulty, no numbness or tingling. She had steriod and marcaine injection with relief for about 1-2 weeks then pain recurred. Radiographs  With medial and Patellofemoral OA changes that are severe.     Review of Systems  Constitutional: Negative.   HENT: Negative.    Eyes: Negative.   Respiratory: Negative.    Cardiovascular: Negative.   Gastrointestinal: Negative.   Endocrine: Negative.   Genitourinary: Negative.   Musculoskeletal: Negative.   Skin: Negative.   Allergic/Immunologic: Negative.   Neurological: Negative.   Hematological: Negative.   Psychiatric/Behavioral: Negative.       Objective: Vital Signs: BP (!) 143/93 (BP Location: Left Arm, Patient Position: Sitting)   Pulse 83   Ht 5\' 7"  (1.702 m)   Wt 280 lb (127 kg)   LMP 04/29/2016   BMI 43.85 kg/m   Physical Exam Constitutional:      Appearance: She is well-developed.  HENT:     Head: Normocephalic and atraumatic.  Eyes:     Pupils: Pupils are equal, round,  and reactive to light.  Pulmonary:     Effort: Pulmonary effort is normal.     Breath sounds: Normal breath sounds.  Abdominal:     General: Bowel sounds are normal.     Palpations: Abdomen is soft.  Musculoskeletal:        General: Normal range of motion.     Cervical back: Normal range of motion and neck supple.     Left knee:     Instability Tests: Medial McMurray test positive. Lateral McMurray test negative.  Skin:    General: Skin is warm and dry.  Neurological:     Mental Status: She is alert and oriented to person, place, and time.  Psychiatric:         Behavior: Behavior normal.        Thought Content: Thought content normal.        Judgment: Judgment normal.    Left Knee Exam   Muscle Strength  The patient has normal left knee strength.  Tenderness  The patient is experiencing tenderness in the medial retinaculum and patellar tendon.  Range of Motion  Extension:  normal  Flexion:  120   Tests  McMurray:  Medial - positive Lateral - negative Varus: positive  Lachman:  Anterior - negative    Posterior - negative Drawer:  Anterior - negative     Posterior - negative Pivot shift: negative Patellar apprehension: positive  Other  Erythema: absent Scars: absent Sensation: normal Pulse: present Swelling: mild  Comments:  Doppler venous study negative     Specialty Comments:  No specialty comments available.  Imaging: No results found.   PMFS History: Patient Active Problem List   Diagnosis Date Noted  . Non-insertional Achilles tendinopathy 07/09/2022  . It band syndrome, left 07/09/2022  . Cholelithiasis 09/26/2021  . Dyspepsia 09/18/2021  . Preventative health care 08/11/2020  . Constipation 01/04/2020  . 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 Menopausal syndrome 05/22/2013  . 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 Hypertension 05/02/2011  . Sleep apnea 05/02/2011  . Morbid obesity (HCC) 05/02/2011   Past Medical History:  Diagnosis Date  . Difficult intubation 1999   "had thick neck" , trouble with intubatuion sept sept 2014 also  . GERD (gastroesophageal reflux disease)    occasional, none lately  . Goiter   . History of kidney stones   . Hyperlipidemia   . Hypertension   . Hypothyroidism   . Morbid obesity (HCC)   . OSA on CPAP    uses cpap some nights, pt does know settings  . PONV (postoperative nausea and vomiting) sept 2014   lots of coughing up phelgm  . Sleep apnea   . Thyroid disease     Family History  Problem Relation Age of Onset  . Diabetes Father   .  Diabetes Paternal Grandmother   . Cancer Maternal Grandfather        lung  . Colon cancer Neg Hx     Past Surgical History:  Procedure Laterality Date  . BREAST SURGERY Bilateral 11/15/06   Reduction  . BREATH TEK H PYLORI N/A 05/26/2013   Procedure: BREATH TEK H PYLORI;  Surgeon: Valarie Merino, MD;  Location: [facility];  Service: General;  Laterality: N/A;  . COLONOSCOPY WITH PROPOFOL N/A 06/17/2014   Procedure: COLONOSCOPY WITH PROPOFOL;  Surgeon: Rachael Fee, MD;  Location: [facility];  Service: Endoscopy;  Laterality: N/A;  . GASTRIC ROUX-EN-Y N/A 09/07/2013   Procedure: LAPAROSCOPIC ROUX-EN-Y  GASTRIC upper endoscopy;  Surgeon: Valarie Merino, MD;  Location: [facility];  Service: General;  Laterality: N/A;  . THYROIDECTOMY  10/30/02  . TUBAL LIGATION  08/25/98   had hard time getting ET tube down per pt.    Social History   Occupational History  . Not on file  Tobacco Use  . Smoking status: Never  . Smokeless tobacco: Never  Substance and Sexual Activity  . Alcohol use: Yes    Comment: occasional social  . Drug use: No  . Sexual activity: Not on file

## 2022-07-25 NOTE — Patient Instructions (Signed)
Plan: Knee is suffering from osteoarthritis, only real proven treatments are Weight loss, NSIADs like diclofenac and exercise. Well padded shoes help. Ice the knee that is suffering from osteoarthritis, only real proven treatments are Ice the knee 2-3 times a day 15-20 mins at a time.-3 times a day 15-20 mins at a time. Hot showers in the AM.  Injection with steroid may be of benefit. Hemp CBD capsules, amazon.com 5,000-7,000 mg per bottle, 60 capsules per bottle, take one capsule twice a day. Cane in the left hand to use with left leg weight bearing. Tramadol for pain Follow-Up Instructions: No follow-ups on file.

## 2022-08-09 ENCOUNTER — Ambulatory Visit (INDEPENDENT_AMBULATORY_CARE_PROVIDER_SITE_OTHER): Payer: BC Managed Care – PPO | Admitting: Orthopaedic Surgery

## 2022-08-09 VITALS — Wt 293.0 lb

## 2022-08-09 DIAGNOSIS — M7989 Other specified soft tissue disorders: Secondary | ICD-10-CM | POA: Diagnosis not present

## 2022-08-09 DIAGNOSIS — M79662 Pain in left lower leg: Secondary | ICD-10-CM

## 2022-08-09 DIAGNOSIS — M1712 Unilateral primary osteoarthritis, left knee: Secondary | ICD-10-CM | POA: Diagnosis not present

## 2022-08-09 MED ORDER — TRAMADOL HCL 50 MG PO TABS: 50.0000 mg | ORAL_TABLET | Freq: Four times a day (QID) | ORAL | 0 refills | Status: DC | PRN

## 2022-08-09 NOTE — Progress Notes (Signed)
Office Visit Note   Patient: Paula Austin           Date of Birth: 12-14-1966           MRN: [id number] Visit Date: 08/09/2022              Requested by: Moses Manners, MD [address] [place],  [place] [address] PCP: Moses Manners, MD   Assessment & Plan: Visit Diagnoses:  1. Unilateral primary osteoarthritis, left knee   2. Pain and swelling of left lower leg     Plan: She does have significant tricompartment arthritis of her left knee.  Since she has not tried hyaluronic acid and is failed conservative treatment with steroid injection, we will order hyaluronic acid for her left knee.  We have advocated weight loss as well and I would like to see if she can lose a little bit more weight before considering scheduling her for knee replacement surgery and she understands this as well.  Each time we do see her we need a new weight and BMI calculation.  We will see if we get hyaluronic acid approved in order for her left knee.  She agrees with this treatment plan.  Follow-Up Instructions: No follow-ups on file.   Orders:  No orders of the defined types were placed in this encounter.  No orders of the defined types were placed in this encounter.     Procedures: No procedures performed   Clinical Data: No additional findings.   Subjective: No chief complaint on file. The patient is a very pleasant 56 year old female referred from Dr. Otelia Sergeant to evaluate and treat severe osteoarthritis of the left knee.  He did place a steroid injection in her knee about 3 weeks ago and she said that really has not helped her much at all.  She does take tramadol only about half a pill as needed and does need a refill of this.  She is someone with a BMI of 45.89 and a weight of 293 pounds.  This is down from 350 pounds which was her highest years ago.  She is still working on weight loss.  At some point she will likely need knee replacement surgery.  She has not had hyaluronic acid  as of yet.  She does not work in a high demand job but she is on her feet quite a bit.  She occasionally will use a cane to offload that knee.  At this point her left knee pain is definitely affecting her mobility, her quality of life, and her actives of daily living.  HPI  Review of Systems There is no listed fever, chills, nausea, vomiting  Objective: Vital Signs: Wt 293 lb (132.9 kg)   LMP 04/29/2016   BMI 45.89 kg/m   Physical Exam She is alert and orient x3 and in no acute distress Ortho Exam Examination of her left knee shows some slight varus malalignment.  There is a mild effusion.  She has significant medial joint line tenderness and patellofemoral crepitation.  Her range of motion is full.  The knee is ligamentously stable. Specialty Comments:  No specialty comments available.  Imaging: No results found. X-rays independently reviewed of the left knee show tricompartment arthritis with significant medial and patellofemoral narrowing.  There are osteophytes in all 3 compartments and varus malalignment.  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 09/09/2013   Menopausal syndrome 05/22/2013   Post-surgical hypothyroidism 05/02/2011   Hypertension 05/02/2011   Sleep apnea 05/02/2011   Morbid obesity (HCC) 05/02/2011   Past Medical History:  Diagnosis Date   Difficult intubation 1999   "had thick neck" , trouble with intubatuion sept sept 2014 also   GERD (gastroesophageal reflux disease)    occasional, none lately   Goiter    History of kidney stones    Hyperlipidemia    Hypertension    Hypothyroidism    Morbid obesity (HCC)    OSA on CPAP    uses cpap some nights, pt does know settings   PONV  (postoperative nausea and vomiting) sept 2014   lots of coughing up phelgm   Sleep apnea    Thyroid disease     Family History  Problem Relation Age of Onset   Diabetes Father    Diabetes Paternal Grandmother    Cancer Maternal Grandfather        lung   Colon cancer Neg Hx     Past Surgical History:  Procedure Laterality Date   BREAST SURGERY Bilateral 11/15/06   Reduction   BREATH TEK H PYLORI N/A 05/26/2013   Procedure: BREATH TEK H PYLORI;  Surgeon: Valarie Merino, MD;  Location: [facility];  Service: General;  Laterality: N/A;   COLONOSCOPY WITH PROPOFOL N/A 06/17/2014   Procedure: COLONOSCOPY WITH PROPOFOL;  Surgeon: Rachael Fee, MD;  Location: [facility];  Service: Endoscopy;  Laterality: N/A;   GASTRIC ROUX-EN-Y N/A 09/07/2013   Procedure: LAPAROSCOPIC ROUX-EN-Y GASTRIC upper endoscopy;  Surgeon: Valarie Merino, MD;  Location: [facility];  Service: General;  Laterality: N/A;   THYROIDECTOMY  10/30/02   TUBAL LIGATION  08/25/98   had hard time getting ET tube down per pt.    Social History   Occupational History   Not on file  Tobacco Use   Smoking status: Never   Smokeless tobacco: Never  Substance and Sexual Activity   Alcohol use: Yes    Comment: occasional social   Drug use: No   Sexual activity: Not on file

## 2022-08-10 ENCOUNTER — Telehealth: Payer: Self-pay

## 2022-08-10 NOTE — Telephone Encounter (Signed)
Left knee gel injection ?

## 2022-08-15 NOTE — Telephone Encounter (Signed)
VOB submitted for SynviscOne, left knee  

## 2022-08-22 ENCOUNTER — Telehealth: Payer: Self-pay

## 2022-08-22 NOTE — Telephone Encounter (Signed)
PA submitted online through [employer] for SynviscOne, left knee. PA pending# [id number]

## 2022-09-01 ENCOUNTER — Other Ambulatory Visit: Payer: Self-pay | Admitting: Orthopaedic Surgery

## 2022-09-12 ENCOUNTER — Other Ambulatory Visit: Payer: Self-pay

## 2022-09-12 DIAGNOSIS — M1712 Unilateral primary osteoarthritis, left knee: Secondary | ICD-10-CM

## 2022-09-13 ENCOUNTER — Ambulatory Visit (INDEPENDENT_AMBULATORY_CARE_PROVIDER_SITE_OTHER): Payer: BC Managed Care – PPO | Admitting: Orthopaedic Surgery

## 2022-09-13 DIAGNOSIS — M7662 Achilles tendinitis, left leg: Secondary | ICD-10-CM

## 2022-09-13 DIAGNOSIS — M1712 Unilateral primary osteoarthritis, left knee: Secondary | ICD-10-CM

## 2022-09-13 MED ORDER — HYLAN G-F 20 48 MG/6ML IX SOSY
48.0000 mg | PREFILLED_SYRINGE | INTRA_ARTICULAR | Status: AC | PRN
  Administered 2022-09-13: 48 mg via INTRA_ARTICULAR

## 2022-09-13 MED ORDER — PREDNISONE 50 MG PO TABS: ORAL_TABLET | ORAL | 0 refills | Status: DC

## 2022-09-13 NOTE — Progress Notes (Signed)
   Procedure Note  Patient: Paula Austin             Date of Birth: [birth year]           MRN: [id number]             Visit Date: 09/13/2022  Procedures: Visit Diagnoses:  1. Unilateral primary osteoarthritis, left knee   2. Achilles tendinitis of left lower extremity     Large Joint Inj: L knee on 09/13/2022 2:48 PM Indications: pain and diagnostic evaluation Details: 22 G 1.5 in needle, superolateral approach  Arthrogram: No  Medications: 48 mg Hylan 48 MG/6ML Outcome: tolerated well, no immediate complications Procedure, treatment alternatives, risks and benefits explained, specific risks discussed. Consent was given by the patient. Immediately prior to procedure a time out was called to verify the correct patient, procedure, equipment, support staff and site/side marked as required. Patient was prepped and draped in the usual sterile fashion.     The patient is here today for a Synvisc 1 injection in her left knee to treat the pain from known osteoarthritis.  I was able to aspirate 40 cc of fluid off of her knee and placed Synvisc 1 in the left knee today without difficulty.  She is still dealing with significant left Achilles tendinitis.  She has tried a heel lift and activity modification as well as anti-inflammatories.  This includes Voltaren gel.  At this point I would like to send her to physical therapy mainly for her left Achilles but also her left knee.  Any modalities per therapist discretion.  I did give her a Thera-Band and showed her to use this for her Achilles.  I would like to see her back in 6 weeks to see how she is doing from the physical therapy for Achilles and some for her knee as well.  I will least also try 5 days of prednisone 50 mg.

## 2022-09-13 NOTE — Addendum Note (Signed)
Addended byLaurann Montana on: 09/13/2022 04:19 PM   Modules accepted: Orders

## 2022-10-08 ENCOUNTER — Encounter: Payer: BC Managed Care – PPO | Admitting: Family Medicine

## 2022-10-25 ENCOUNTER — Encounter: Payer: Self-pay | Admitting: Orthopaedic Surgery

## 2022-10-25 ENCOUNTER — Other Ambulatory Visit: Payer: Self-pay | Admitting: Orthopaedic Surgery

## 2022-10-25 ENCOUNTER — Telehealth: Payer: Self-pay | Admitting: Orthopaedic Surgery

## 2022-10-25 ENCOUNTER — Ambulatory Visit (INDEPENDENT_AMBULATORY_CARE_PROVIDER_SITE_OTHER): Payer: Self-pay | Admitting: Orthopaedic Surgery

## 2022-10-25 DIAGNOSIS — M7662 Achilles tendinitis, left leg: Secondary | ICD-10-CM

## 2022-10-25 DIAGNOSIS — M1712 Unilateral primary osteoarthritis, left knee: Secondary | ICD-10-CM

## 2022-10-25 NOTE — Telephone Encounter (Signed)
Dr Magnus Ivan was supposed to send in a perception for tramadol to pharmacy. Best number to reach patient  [phone] and she said she was going out of town  so would need this meds before she leaves

## 2022-10-25 NOTE — Progress Notes (Signed)
The patient is a 56 year old female who is coming in for follow-up after having a hyaluronic acid injection 6 weeks ago in her left knee with Synvisc 1.  We were seeing her back mainly for Achilles tendinitis.  She was unable to go to physical therapy but she says she is feeling much better overall.  She is using Voltaren gel when she can.  We have talked her about weight loss as well.  On exam her left knee has no effusion today.  She has full flexion and full extension with some patellofemoral crepitation be expected.  Knee is ligamentously stable.  Her Achilles is intact with minimal pain.  She will continue to increase her activity as comfort allows.  I recommended quad strengthening and weight loss and if there is any issues at all she knows to come back and see [place] at any time.  She knows to wait at least 6 months minimum between these types of injections.

## 2022-10-26 MED ORDER — TRAMADOL HCL 50 MG PO TABS: 50.0000 mg | ORAL_TABLET | Freq: Four times a day (QID) | ORAL | 0 refills | Status: DC | PRN

## 2022-12-07 ENCOUNTER — Other Ambulatory Visit: Payer: Self-pay | Admitting: Orthopaedic Surgery

## 2022-12-07 MED ORDER — TRAMADOL HCL 50 MG PO TABS: 50.0000 mg | ORAL_TABLET | Freq: Four times a day (QID) | ORAL | 0 refills | Status: DC | PRN

## 2022-12-18 ENCOUNTER — Ambulatory Visit (HOSPITAL_COMMUNITY)
Admission: EM | Admit: 2022-12-18 | Discharge: 2022-12-18 | Disposition: A | Discharge status: Home / Self Care | Payer: BC Managed Care – PPO

## 2022-12-19 DIAGNOSIS — J0100 Acute maxillary sinusitis, unspecified: Secondary | ICD-10-CM | POA: Diagnosis not present

## 2023-01-17 ENCOUNTER — Ambulatory Visit (INDEPENDENT_AMBULATORY_CARE_PROVIDER_SITE_OTHER): Payer: BC Managed Care – PPO | Admitting: Family Medicine

## 2023-01-17 ENCOUNTER — Encounter: Payer: Self-pay | Admitting: Family Medicine

## 2023-01-17 VITALS — BP 116/78 | HR 84 | Ht 67.0 in | Wt 292.4 lb

## 2023-01-17 DIAGNOSIS — E890 Postprocedural hypothyroidism: Secondary | ICD-10-CM

## 2023-01-17 DIAGNOSIS — I10 Essential (primary) hypertension: Secondary | ICD-10-CM

## 2023-01-17 DIAGNOSIS — Z0000 Encounter for general adult medical examination without abnormal findings: Secondary | ICD-10-CM

## 2023-01-17 DIAGNOSIS — G4730 Sleep apnea, unspecified: Secondary | ICD-10-CM

## 2023-01-17 DIAGNOSIS — Z23 Encounter for immunization: Secondary | ICD-10-CM

## 2023-01-17 DIAGNOSIS — Z833 Family history of diabetes mellitus: Secondary | ICD-10-CM

## 2023-01-17 DIAGNOSIS — K8020 Calculus of gallbladder without cholecystitis without obstruction: Secondary | ICD-10-CM

## 2023-01-17 HISTORY — DX: Family history of diabetes mellitus: Z83.3

## 2023-01-17 LAB — POCT GLYCOSYLATED HEMOGLOBIN (HGB A1C): Hemoglobin A1C: 5.7 % — AB (ref 4.0–5.6)

## 2023-01-17 NOTE — Patient Instructions (Addendum)
You need the bivalent covid booster.  You can do a nurse visit here.  If you get it elsewhere, let us know so we can update our records. You are due for the Shingles vaccine - a series of two shots, one now and one two to six months later.  Given at the drugstore.  Again, if when you get it, let us know.  Because many folks feel bad after the vaccine, get it when you could be down for a few days.   Remind me when I call with test results, to send in a prescription for the thyroid medication.   Let me know if you want a surgical referral to get your gall bladder out. I will call with blood work results. Your new doctor will be dr. Ky Barban.  She does not start until June.  Keep working on the weight.   I will miss you.  It has been a privilege.

## 2023-01-18 ENCOUNTER — Encounter: Payer: Self-pay | Admitting: Family Medicine

## 2023-01-18 MED ORDER — LEVOTHYROXINE SODIUM 125 MCG PO TABS: 125.0000 ug | ORAL_TABLET | Freq: Every day | ORAL | 3 refills | Status: DC

## 2023-01-18 NOTE — Assessment & Plan Note (Signed)
With normal TSH, refill levothyroxine at current dose.

## 2023-01-18 NOTE — Assessment & Plan Note (Addendum)
Well controled.  Mild elevation of LDL.  10 year ASCVD is is 4.1%.

## 2023-01-18 NOTE — Assessment & Plan Note (Signed)
Offered surgical referral whenever she wishes.

## 2023-01-18 NOTE — Progress Notes (Signed)
    SUBJECTIVE:   CHIEF COMPLAINT / HPI:   Annual exam.   Acute problems None Chronic problems. Morbid obesity.  S/P bariatric surgery.  Weight stable.  Unfortunately, no weight loss.  FHx of DM.  With weight, she is at high risk. Cholelithiasis with GB in situ.  I have one or two attacks per year.  Discussed the option of surgery. Surgical hypothyroid.  Needs refill.  Check TSH first. Sleep apnea.  Due for another sleep study.  She has trouble tolerating mask. HPDP  Needs covid booster and shingles vaccine.   Otherwise up to date.  PERTINENT  PMH / PSH: Denies CP, DOE, Bleeding, worrisome skin changes.  Denies change in bowel, bladder, appetite or weight.  OBJECTIVE:   BP 116/78   Pulse 84   Ht 5\' 7"  (1.702 m)   Wt 292 lb 6.4 oz (132.6 kg)   LMP 04/29/2016   SpO2 98%   BMI 45.80 kg/m   HEENT WNL Neck supple, thyroidectomy scar Lungs clear Cardiac RRR without m or g Abd benign Ext no edema Neuro, motor, sensory, gait, cognition and affect all grossly normal.  ASSESSMENT/PLAN:   Post-surgical hypothyroidism With normal TSH, refill levothyroxine at current dose.  Preventative health care Normal healthy woman with no at risk behaviors.  Major reversible health risk is obesity.  Sleep apnea Asked about tV commercials for inspire.  I do no know and deferred to sleep specialist.  Ordered repeat sleep study.  Cholelithiasis Offered surgical referral whenever she wishes.    Family history of diabetes mellitus A1C is reassuring.  Hypertension Well controled.  Mild elevation of LDL.  10 year ASCVD is is 4.1%.     Zenia Resides, MD [place]

## 2023-01-18 NOTE — Assessment & Plan Note (Signed)
A1C is reassuring.

## 2023-01-18 NOTE — Assessment & Plan Note (Signed)
Asked about tV commercials for inspire.  I do no know and deferred to sleep specialist.  Ordered repeat sleep study.

## 2023-01-18 NOTE — Assessment & Plan Note (Signed)
Normal healthy woman with no at risk behaviors.  Major reversible health risk is obesity.

## 2023-01-20 LAB — CMP14+EGFR
ALT: 22 IU/L (ref 0–32)
AST: 16 IU/L (ref 0–40)
Albumin/Globulin Ratio: 1.3 (ref 1.2–2.2)
Albumin: 4.3 g/dL (ref 3.8–4.9)
Alkaline Phosphatase: 104 IU/L (ref 44–121)
BUN/Creatinine Ratio: 16 (ref 9–23)
BUN: 13 mg/dL (ref 6–24)
Bilirubin Total: 0.4 mg/dL (ref 0.0–1.2)
CO2: 26 mmol/L (ref 20–29)
Calcium: 9.6 mg/dL (ref 8.7–10.2)
Chloride: 101 mmol/L (ref 96–106)
Creatinine, Ser: 0.81 mg/dL (ref 0.57–1.00)
Globulin, Total: 3.2 g/dL (ref 1.5–4.5)
Glucose: 92 mg/dL (ref 70–99)
Potassium: 4.1 mmol/L (ref 3.5–5.2)
Sodium: 141 mmol/L (ref 134–144)
Total Protein: 7.5 g/dL (ref 6.0–8.5)
eGFR: 84 mL/min/{1.73_m2} (ref >59)

## 2023-01-20 LAB — MICROALBUMIN / CREATININE URINE RATIO
Creatinine, Urine: 133 mg/dL
Microalb/Creat Ratio: 11 mg/g creat (ref 0–29)
Microalbumin, Urine: 15 ug/mL

## 2023-01-20 LAB — CBC
Hematocrit: 41.3 % (ref 34.0–46.6)
Hemoglobin: 13.9 g/dL (ref 11.1–15.9)
MCH: 30.5 pg (ref 26.6–33.0)
MCHC: 33.7 g/dL (ref 31.5–35.7)
MCV: 91 fL (ref 79–97)
Platelets: 250 10*3/uL (ref 150–450)
RBC: 4.55 x10E6/uL (ref 3.77–5.28)
RDW: 13.2 % (ref 11.7–15.4)
WBC: 6.3 10*3/uL (ref 3.4–10.8)

## 2023-01-20 LAB — LIPID PANEL
Chol/HDL Ratio: 3.3 ratio (ref 0.0–4.4)
Cholesterol, Total: 212 mg/dL — ABNORMAL HIGH (ref 100–199)
HDL: 65 mg/dL (ref >39)
LDL Chol Calc (NIH): 134 mg/dL — ABNORMAL HIGH (ref 0–99)
Triglycerides: 74 mg/dL (ref 0–149)
VLDL Cholesterol Cal: 13 mg/dL (ref 5–40)

## 2023-01-20 LAB — TSH: TSH: 4.07 u[IU]/mL (ref 0.450–4.500)

## 2023-01-22 ENCOUNTER — Encounter: Payer: Self-pay | Admitting: Family Medicine

## 2023-01-28 ENCOUNTER — Telehealth: Payer: Self-pay | Admitting: *Deleted

## 2023-01-28 NOTE — Telephone Encounter (Signed)
Office note and sleep study printed and faxed.  Ivey Nembhard,CMA

## 2023-01-28 NOTE — Telephone Encounter (Signed)
Called  On phone I was told this was not a peer to peer, rather it was an appeal.  They need clinical info faxed to [phone] attn appeals case # [id number]  Please fax sleep study on 06/13/2011 which is under media tab for that date.  Also my office visit note of 01/17/2023.  Add that the patient cannot continue to get her DME CPAP without a new study.  They request this information by 5PM 01/30/23.  I will not miss dealing with insurance companies.

## 2023-01-28 NOTE — Telephone Encounter (Signed)
Received call from [facility] [place] stating that patient's blue cross blue shield is requiring a peer to peer for her sleep study.  Please call [phone] case # [id number].  Let me know what the status is and I will call the sleep center and update them.    Thanks [employer]

## 2023-02-01 NOTE — Telephone Encounter (Signed)
Received fax from insurance that sleep study is still denied even after peer to peer and review of notes.  I have placed the full explanation in your box.  We could try a home sleep test to see if that will work, if not patient may need to see the sleep doctor first.  [facility]

## 2023-02-01 NOTE — Telephone Encounter (Signed)
Spoke with patient. Made appt for 2/20 at 8:50am. Salvatore Marvel, CMA

## 2023-02-04 ENCOUNTER — Ambulatory Visit: Payer: BC Managed Care – PPO

## 2023-02-04 ENCOUNTER — Encounter: Payer: Self-pay | Admitting: Family Medicine

## 2023-02-04 VITALS — BP 120/80 | Ht 67.0 in | Wt 292.0 lb

## 2023-02-04 DIAGNOSIS — Z23 Encounter for immunization: Secondary | ICD-10-CM

## 2023-02-05 ENCOUNTER — Ambulatory Visit: Payer: BC Managed Care – PPO | Admitting: Family Medicine

## 2023-02-05 ENCOUNTER — Encounter: Payer: Self-pay | Admitting: Family Medicine

## 2023-02-05 VITALS — BP 143/86 | HR 81 | Ht 67.0 in | Wt 296.6 lb

## 2023-02-05 DIAGNOSIS — R1013 Epigastric pain: Secondary | ICD-10-CM

## 2023-02-05 DIAGNOSIS — E785 Hyperlipidemia, unspecified: Secondary | ICD-10-CM | POA: Insufficient documentation

## 2023-02-05 DIAGNOSIS — G4730 Sleep apnea, unspecified: Secondary | ICD-10-CM

## 2023-02-05 DIAGNOSIS — R7303 Prediabetes: Secondary | ICD-10-CM | POA: Diagnosis not present

## 2023-02-05 MED ORDER — FAMOTIDINE 40 MG PO TABS: 40.0000 mg | ORAL_TABLET | Freq: Every day | ORAL | 1 refills | Status: DC

## 2023-02-05 MED ORDER — WEGOVY 0.25 MG/0.5ML ~~LOC~~ SOAJ: 0.2500 mg | SUBCUTANEOUS | 1 refills | Status: DC

## 2023-02-05 NOTE — Assessment & Plan Note (Signed)
Doing a great job with her lifestyle modification. However, will like to trial GLP1. [name] discussed and was started. Denies family hx of Thyroid cancer and she does not have a thyroid - thyroidectomy many years ago.

## 2023-02-05 NOTE — Assessment & Plan Note (Signed)
Referral to sleep study with Dr. Annamaria Boots. Plan to discuss Inspire apnea therapy at the visit. Continue current sleep machine pending evaluation. She agreed with the plan.

## 2023-02-05 NOTE — Assessment & Plan Note (Signed)
Mildly elevated A1C. Fhx of DM 2. Continue lifestyle modification for weight loss. Recheck A1C in 6 months.

## 2023-02-05 NOTE — Patient Instructions (Signed)
Semaglutide Injection (Weight Management) What is this medication? SEMAGLUTIDE (SEM a GLOO tide) promotes weight loss. It may also be used to maintain weight loss. It works by decreasing appetite. Changes to diet and exercise are often combined with this medication. This medicine may be used for other purposes; ask your health care provider or pharmacist if you have questions. COMMON BRAND NAME(S): Wegovy What should I tell my care team before I take this medication? They need to know if you have any of these conditions: Endocrine tumors (MEN 2) or if someone in your family had these tumors Eye disease, vision problems Gallbladder disease History of depression or mental health disease History of pancreatitis Kidney disease Stomach or intestine problems Suicidal thoughts, plans, or attempt; a previous suicide attempt by you or a family member Thyroid cancer or if someone in your family had thyroid cancer An unusual or allergic reaction to semaglutide, other medications, foods, dyes, or preservatives Pregnant or trying to get pregnant Breast-feeding How should I use this medication? This medication is injected under the skin. You will be taught how to prepare and give it. Take it as directed on the prescription label. It is given once every week (every 7 days). Keep taking it unless your care team tells you to stop. It is important that you put your used needles and pens in a special sharps container. Do not put them in a trash can. If you do not have a sharps container, call your pharmacist or care team to get one. A special MedGuide will be given to you by the pharmacist with each prescription and refill. Be sure to read this information carefully each time. This medication comes with INSTRUCTIONS FOR USE. Ask your pharmacist for directions on how to use this medication. Read the information carefully. Talk to your pharmacist or care team if you have questions. Talk to your care team about  the use of this medication in children. While it may be prescribed for children as young as 12 years for selected conditions, precautions do apply. Overdosage: If you think you have taken too much of this medicine contact a poison control center or emergency room at once. NOTE: This medicine is only for you. Do not share this medicine with others. What if I miss a dose? If you miss a dose and the next scheduled dose is more than 2 days away, take the missed dose as soon as possible. If you miss a dose and the next scheduled dose is less than 2 days away, do not take the missed dose. Take the next dose at your regular time. Do not take double or extra doses. If you miss your dose for 2 weeks or more, take the next dose at your regular time or call your care team to talk about how to restart this medication. What may interact with this medication? Insulin and other medications for diabetes This list may not describe all possible interactions. Give your health care provider a list of all the medicines, herbs, non-prescription drugs, or dietary supplements you use. Also tell them if you smoke, drink alcohol, or use illegal drugs. Some items may interact with your medicine. What should I watch for while using this medication? Visit your care team for regular checks on your progress. It may be some time before you see the benefit from this medication. Drink plenty of fluids while taking this medication. Check with your care team if you have severe diarrhea, nausea, and vomiting, or if you sweat a   lot. The loss of too much body fluid may make it dangerous for you to take this medication. This medication may affect blood sugar levels. Ask your care team if changes in diet or medications are needed if you have diabetes. If you or your family notice any changes in your behavior, such as new or worsening depression, thoughts of harming yourself, anxiety, other unusual or disturbing thoughts, or memory loss, call  your care team right away. Women should inform their care team if they wish to become pregnant or think they might be pregnant. Losing weight while pregnant is not advised and may cause harm to the unborn child. Talk to your care team for more information. What side effects may I notice from receiving this medication? Side effects that you should report to your care team as soon as possible: Allergic reactions--skin rash, itching, hives, swelling of the face, lips, tongue, or throat Change in vision Dehydration--increased thirst, dry mouth, feeling faint or lightheaded, headache, dark yellow or brown urine Gallbladder problems--severe stomach pain, nausea, vomiting, fever Heart palpitations--rapid, pounding, or irregular heartbeat Kidney injury--decrease in the amount of urine, swelling of the ankles, hands, or feet Pancreatitis--severe stomach pain that spreads to your back or gets worse after eating or when touched, fever, nausea, vomiting Thoughts of suicide or self-harm, worsening mood, feelings of depression Thyroid cancer--new mass or lump in the neck, pain or trouble swallowing, trouble breathing, hoarseness Side effects that usually do not require medical attention (report to your care team if they continue or are bothersome): Diarrhea Loss of appetite Nausea Stomach pain Vomiting This list may not describe all possible side effects. Call your doctor for medical advice about side effects. You may report side effects to FDA at 1-800-FDA-1088. Where should I keep my medication? Keep out of the reach of children and pets. Refrigeration (preferred): Store in the refrigerator. Do not freeze. Keep this medication in the original container until you are ready to take it. Get rid of any unused medication after the expiration date. Room temperature: If needed, prior to cap removal, the pen can be stored at room temperature for up to 28 days. Protect from light. If it is stored at room  temperature, get rid of any unused medication after 28 days or after it expires, whichever is first. It is important to get rid of the medication as soon as you no longer need it or it is expired. You can do this in two ways: Take the medication to a medication take-back program. Check with your pharmacy or law enforcement to find a location. If you cannot return the medication, follow the directions in the MedGuide. NOTE: This sheet is a summary. It may not cover all possible information. If you have questions about this medicine, talk to your doctor, pharmacist, or health care provider.  2023 Elsevier/Gold Standard (2021-02-16 00:00:00)  

## 2023-02-05 NOTE — Progress Notes (Signed)
    SUBJECTIVE:   CHIEF COMPLAINT / HPI:   Sleep study: She is here for f/u. She has been on CPAP since 2014. She is not tolerating her current machine well and need reassessment. She is interested in [place] therapy.  S: She endorses hx of loud snoring. T:She feels tired and fatigues during the day almost daily. O:Partner observed her stop breathing at nighttime.  P:She is on BP (HTN) therapy.  B:BMI 46.44 kg/m2 A: Age 57 N: Neck circumference 48 cm G:Gender Female  STOP BANG score: 7  HLD/Weight management: Not on meds. She works on exercising and diet. Limited exercise due to knee concerns. She had gastric bypass many years ago for weight control and have gained back some of her weight. She will like a pharmacologic agent to help with weight management.   PreDM: Father and paternal grandmother has hx of DM2.   PERTINENT  PMH / PSH: PMHx reviewed  OBJECTIVE:   BP (!) 143/86   Pulse 81   Ht 5' 7"$  (1.702 m)   Wt 296 lb 9.6 oz (134.5 kg)   LMP 04/29/2016   SpO2 100%   BMI 46.45 kg/m   Neck circumference 48 cm  Physical Exam Vitals and nursing note reviewed.  Cardiovascular:     Rate and Rhythm: Normal rate and regular rhythm.     Heart sounds: Normal heart sounds. No murmur heard. Pulmonary:     Effort: Pulmonary effort is normal. No respiratory distress.     Breath sounds: Normal breath sounds. No wheezing.  Musculoskeletal:     Cervical back: Neck supple.     Right lower leg: No edema.     Left lower leg: No edema.      ASSESSMENT/PLAN:   Sleep apnea Referral to sleep study with Dr. Annamaria Boots. Plan to discuss Inspire apnea therapy at the visit. Continue current sleep machine pending evaluation. She agreed with the plan.   Hyperlipidemia Will continue lifestyle modification for now.  Prediabetes Mildly elevated A1C. Fhx of DM 2. Continue lifestyle modification for weight loss. Recheck A1C in 6 months.   Morbid obesity ([place]) Doing a great job with  her lifestyle modification. However, will like to trial GLP1. [name] discussed and was started. Denies family hx of Thyroid cancer and she does not have a thyroid - thyroidectomy many years ago.      Andrena Mews, MD [place]

## 2023-02-05 NOTE — Assessment & Plan Note (Signed)
Will continue lifestyle modification for now.

## 2023-02-07 ENCOUNTER — Telehealth: Payer: Self-pay

## 2023-02-07 NOTE — Telephone Encounter (Signed)
Received phone call from patient regarding PA needed on [facility] prescription.   Rosendo Gros- Have you received fax with cover my meds key?   Talbot Grumbling, RN

## 2023-02-08 ENCOUNTER — Other Ambulatory Visit (HOSPITAL_COMMUNITY): Payer: Self-pay

## 2023-02-11 ENCOUNTER — Encounter: Payer: Self-pay | Admitting: Family Medicine

## 2023-02-15 ENCOUNTER — Other Ambulatory Visit (HOSPITAL_COMMUNITY): Payer: Self-pay

## 2023-02-18 DIAGNOSIS — Z1231 Encounter for screening mammogram for malignant neoplasm of breast: Secondary | ICD-10-CM | POA: Diagnosis not present

## 2023-02-18 LAB — HM MAMMOGRAPHY

## 2023-02-24 NOTE — Progress Notes (Unsigned)
02/26/23- 4 yoF never smoker for sleep evaluation courtesy of Dr Andrena Mews with concern of OSA Medical problem list includes HTN, Cholelithiasis, Hypothyroid, Morbid Obesity, hx Bariatric RnY 2014, Hyperlipidemia,  NPSG 06/13/11- AHI 101.2/ hr, desaturation to 68%, body weight 320 lbs, CPAP to 17 Epworth score-13 Body weight today-300 lbs -----Patient had a sleep study in 2012. Was on a cpap machine previously, stopped using machine over a year ago. Patient is interested in [place] After bariatric surgery in 2014 she lost weight and adjusted away from CPAP.  She was finding it made her swallow air, suggesting pressure at that point was too high.  Also mask was irritating and it does not sound as if she got any help with refitting of mask. ENT surgery limited to thyroid resection.  Denies heart or lung disease.  No sleep medicines.  Drinks sweet tea but no other stimulants.  Family tells her that her snoring is very loud.  Some daytime sleepiness.  I reviewed the basics of sleep apnea, medical concerns, contributing factors, diagnostic and treatment options.  Because she is interested in [place] I recommended a split-night sleep study since some insurances will not except a home sleep test for this consideration.  Prior to Admission medications   Medication Sig Start Date End Date Taking? Authorizing Provider  cyanocobalamin 1000 MCG tablet Take 1,000 mcg by mouth daily.    Yes [provider]  levothyroxine (SYNTHROID) 125 MCG tablet Take 1 tablet (125 mcg total) by mouth daily before breakfast. 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   "had thick neck" , trouble with intubatuion sept sept 2014 also   Family history of diabetes mellitus 01/17/2023   GERD (gastroesophageal reflux disease)     occasional, none lately   Goiter    History of kidney stones    Hyperlipidemia    Hypertension    Hypothyroidism    Morbid obesity ([place])    OSA on CPAP    uses cpap some nights, pt does know settings   PONV (postoperative nausea and vomiting) 08/17/2013   lots of coughing up phelgm   Sleep apnea    Thyroid disease    Past Surgical History:  Procedure Laterality Date   BREAST SURGERY Bilateral 11/15/06   Reduction   BREATH TEK H PYLORI N/A 05/26/2013   Procedure: BREATH TEK H PYLORI;  Surgeon: Pedro Earls, MD;  Location: [facility];  Service: General;  Laterality: N/A;   COLONOSCOPY WITH PROPOFOL N/A 06/17/2014   Procedure: COLONOSCOPY WITH PROPOFOL;  Surgeon: Milus Banister, MD;  Location: [facility];  Service: Endoscopy;  Laterality: N/A;   GASTRIC ROUX-EN-Y N/A 09/07/2013   Procedure: LAPAROSCOPIC ROUX-EN-Y GASTRIC upper endoscopy;  Surgeon: Pedro Earls, MD;  Location: [facility]S;  Service: General;  Laterality: N/A;   THYROIDECTOMY  10/30/02   TUBAL LIGATION  08/25/98   had hard time getting ET tube down per pt.    Family History  Problem Relation Age of Onset   Diabetes Father    Diabetes Paternal Grandmother    Cancer Maternal Grandfather        lung   Colon cancer Neg Hx    Social History   Socioeconomic History   Marital status: Married    Spouse name: Not on file   Number of  children: Not on file   Years of education: Not on file   Highest education level: Not on file  Occupational History   Not on file  Tobacco Use   Smoking status: Never   Smokeless tobacco: Never  Substance and Sexual Activity   Alcohol use: Yes    Comment: occasional social   Drug use: No   Sexual activity: Not on file  Other Topics Concern   Not on file  Social History Narrative   Not on file   Social Determinants of Health   Financial Resource Strain: Not on file  Food Insecurity: Not on file  Transportation Needs: Not on file  Physical Activity: Not on file   Stress: Not on file  Social Connections: Not on file  Intimate Partner Violence: Not on file   ROS-see HPI   + = positive Constitutional:    weight loss, night sweats, fevers, chills, fatigue, lassitude. HEENT:    headaches, difficulty swallowing, tooth/dental problems, sore throat,       sneezing, itching, ear ache, nasal congestion, post nasal drip, snoring CV:    chest pain, orthopnea, PND, swelling in lower extremities, anasarca,                                  dizziness, palpitations Resp:   shortness of breath with exertion or at rest.                productive cough,   non-productive cough, coughing up of blood.              change in color of mucus.  wheezing.   Skin:    rash or lesions. GI:  No-   heartburn, indigestion, abdominal pain, nausea, vomiting, diarrhea,                 change in bowel habits, loss of appetite GU: dysuria, change in color of urine, no urgency or frequency.   flank pain. MS:   joint pain, +stiffness, decreased range of motion, back pain. Neuro-     nothing unusual Psych:  change in mood or affect.  depression or anxiety.   memory loss.  OBJ- Physical Exam General- Alert, Oriented, Affect-appropriate, Distress- none acute, +morbid obesity Skin- rash-none, lesions- none, excoriation- none Lymphadenopathy- none Head- atraumatic            Eyes- Gross vision intact, PERRLA, conjunctivae and secretions clear            Ears- Hearing, canals-normal            Nose- Clear, no-Septal dev, mucus, polyps, erosion, perforation             Throat- Mallampati IV , mucosa clear , drainage- none, tonsils- atrophic, +teeth Neck- flexible , trachea midline, no stridor , thyroid nl, carotid no bruit Chest - symmetrical excursion , unlabored           Heart/CV- RRR , no murmur , no gallop  , no rub, nl s1 s2                           - JVD- none , edema- none, stasis changes- none, varices- none           Lung- clear to P&A, wheeze- none, cough- none ,  dullness-none, rub- none           Chest wall-  Abd-  Br/ Gen/ Rectal- Not done, not indicated Extrem- cyanosis- none, clubbing, none, atrophy- none, strength- nl Neuro- grossly intact to observation

## 2023-02-25 ENCOUNTER — Telehealth: Payer: Self-pay

## 2023-02-25 NOTE — Telephone Encounter (Signed)
ATC X1 LVM for patient to call the office back. When patient calls back please ask what DME company she uses for her cpap

## 2023-02-26 ENCOUNTER — Encounter: Payer: Self-pay | Admitting: Internal Medicine

## 2023-02-26 ENCOUNTER — Ambulatory Visit (INDEPENDENT_AMBULATORY_CARE_PROVIDER_SITE_OTHER): Payer: BC Managed Care – PPO | Admitting: Internal Medicine

## 2023-02-26 VITALS — BP 126/72 | HR 74 | Ht 67.0 in | Wt 300.3 lb

## 2023-02-26 DIAGNOSIS — G4733 Obstructive sleep apnea (adult) (pediatric): Secondary | ICD-10-CM

## 2023-02-26 NOTE — Assessment & Plan Note (Signed)
Regaining weight after bariatric surgery.  She indicates she is beginning to work on this again.

## 2023-02-26 NOTE — Patient Instructions (Signed)
Order- schedule Split Night Sleep Study   -  dx OSA  Please call us for results and recommendations about 2 weeks after your test

## 2023-02-26 NOTE — Assessment & Plan Note (Signed)
History of severe obstructive sleep apnea, probably still significant.  Appropriate discussion done and questions answered.  She will be too heavy for [facility] Part now but if she remains interested we can send her for direct discussion as appropriate. Plan-schedule split-night sleep study.  Encouraged weight loss.  We can resume CPAP will look at alternative treatments as appropriate after sleep study done.

## 2023-04-01 ENCOUNTER — Ambulatory Visit: Payer: BC Managed Care – PPO

## 2023-04-01 VITALS — BP 179/109 | Ht 67.0 in | Wt 294.0 lb

## 2023-04-01 DIAGNOSIS — Z23 Encounter for immunization: Secondary | ICD-10-CM

## 2023-04-01 NOTE — Progress Notes (Signed)
Paula Austin present to th unit today for 2nd Covid Vaccination.   0.3 ml IM injection administered in Left deltoid, Lot# [id number], exp date: 05.2024. She had no adverse reactions.   Paula Austin, Bp was elevated at time of visit, She sates she did not take bp medication today. Provider was notified and spoke with her briefly. It was recommended for her to keep a log of her bp readings daily and schedule with her PCP or the Mobile unit.  She was in understanding  and had no further questions.

## 2023-04-03 ENCOUNTER — Encounter: Payer: Self-pay | Admitting: Internal Medicine

## 2023-05-03 ENCOUNTER — Telehealth: Payer: Self-pay | Admitting: Internal Medicine

## 2023-05-03 ENCOUNTER — Other Ambulatory Visit: Payer: Self-pay

## 2023-05-03 DIAGNOSIS — G4733 Obstructive sleep apnea (adult) (pediatric): Secondary | ICD-10-CM

## 2023-05-03 NOTE — Telephone Encounter (Signed)
Order has been placed for HST 

## 2023-05-03 NOTE — Telephone Encounter (Signed)
Insurance wants her to have a home sleep test, not In-Center. Please reorder as HST for dx OSA.

## 2023-05-03 NOTE — Telephone Encounter (Signed)
In lab sleep study for this pt has been denied by [facility]   CASE ID: [id number]  Statement from carelon: Your doctor told us you stop breathing for short periods of time while you are sleeping (sleep apnea). Your doctor ordered a sleep test to see how a person breathes when asleep. Your doctor wants this test to be done in a sleep lab setting. It is common for people to have this test done at home. You need to have a reason why this test cannot be done at home. These reasons might include lung or heart disease. We reviewed the notes we have. The notes do not show that you cannot have this test done at home. Based on the information we have, this test is not medically necessary.  This may get approved for HST please advisde if you would like to complete P2P for this authorization or change to a HST.   Thanks

## 2023-05-08 NOTE — Telephone Encounter (Signed)
HST has been sent to snap. Closing encounter for now. NFN

## 2023-06-05 IMAGING — US US ABDOMEN LIMITED
1 series · 14 of 25 positions shown · non-contrast
Comparison: CT 12/15/2009, ultrasound 06/12/2013

CLINICAL DATA: Indigestion

EXAM:
ULTRASOUND ABDOMEN LIMITED RIGHT UPPER QUADRANT

[Series 1: us abdomen limited · 0.21mm/px · 14 of 55 slices shown]
[im 1/55]
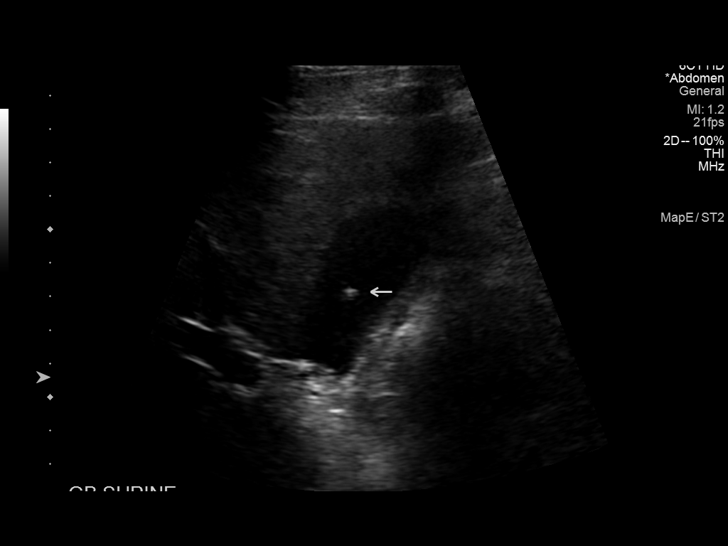
[im 5/55]
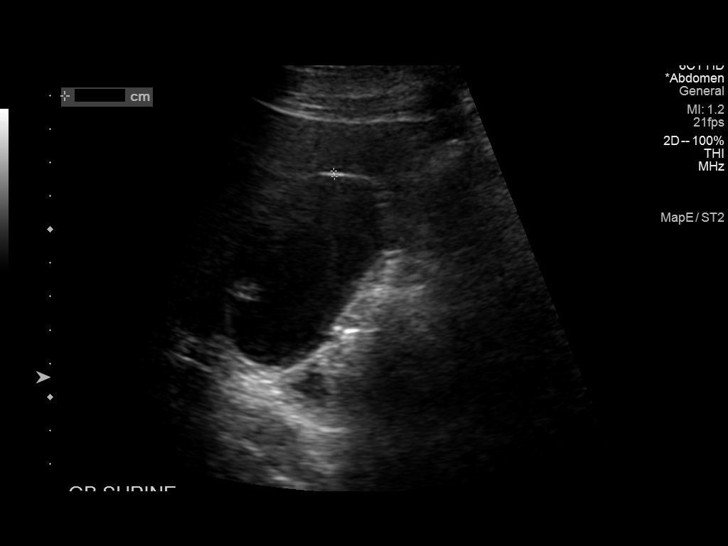
[im 10/55]
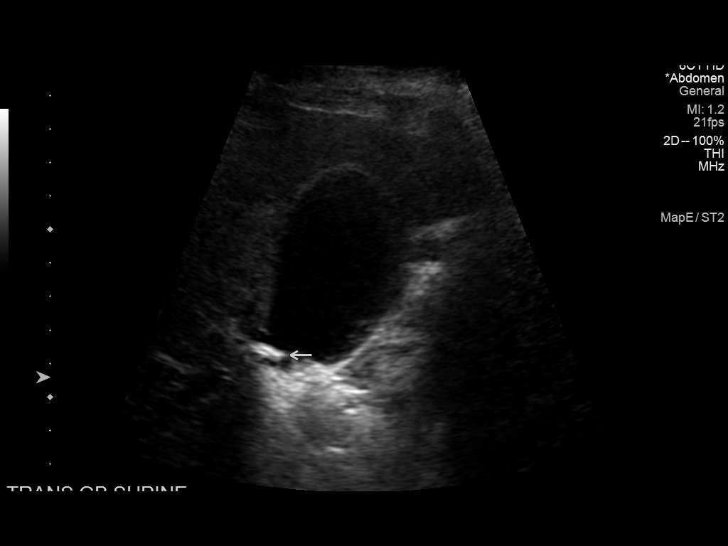
[im 14/55]
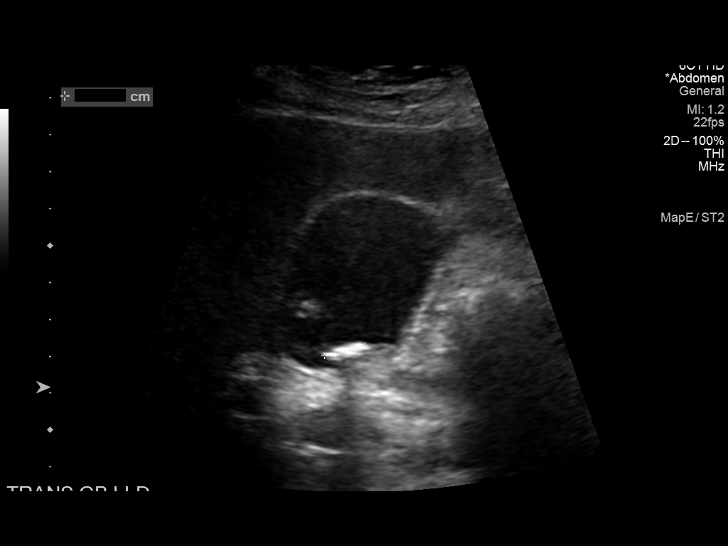
[im 19/55]
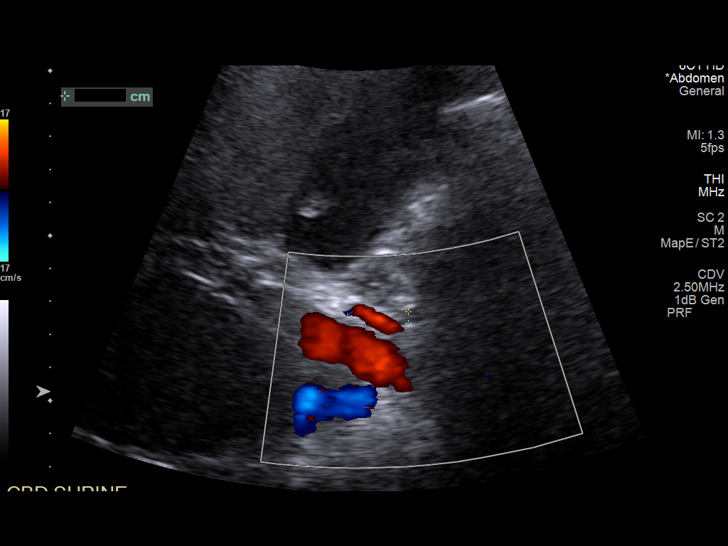
[im 21/55]
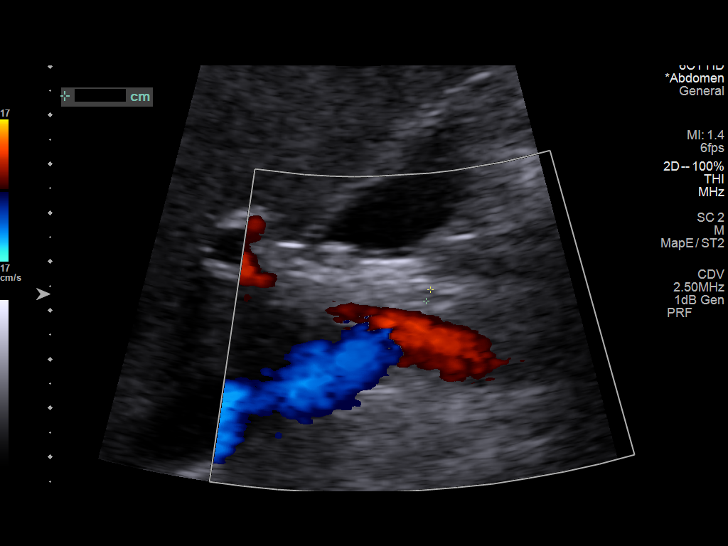
[im 25/55]
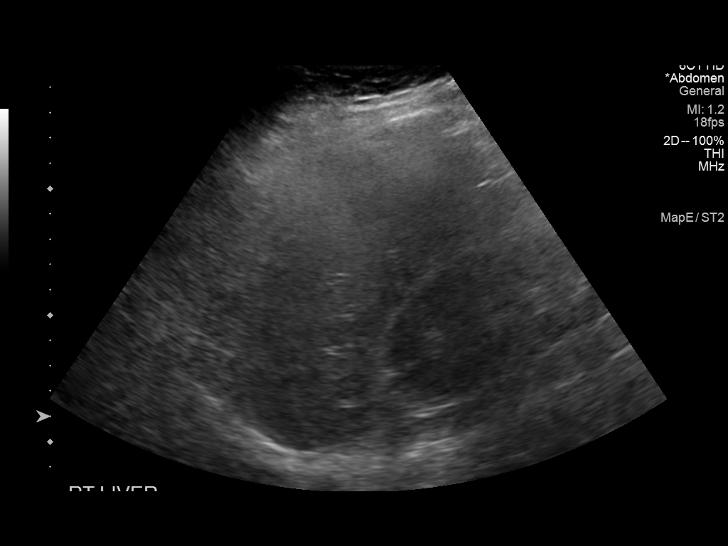
[im 30/55]
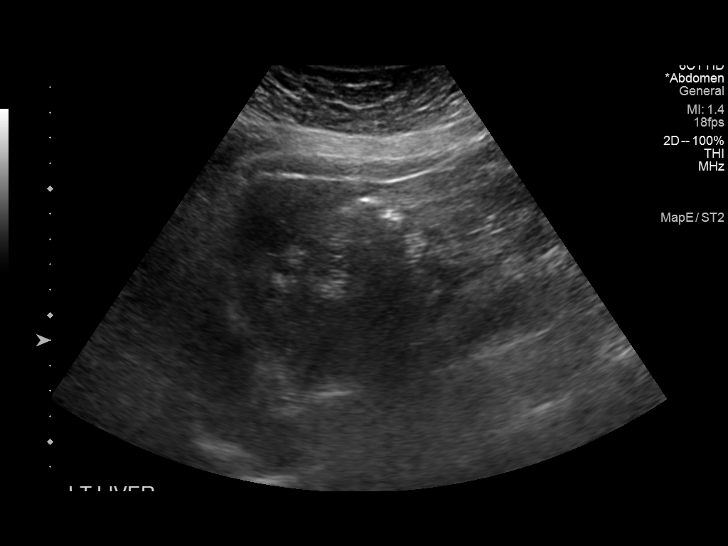
[im 34/55]
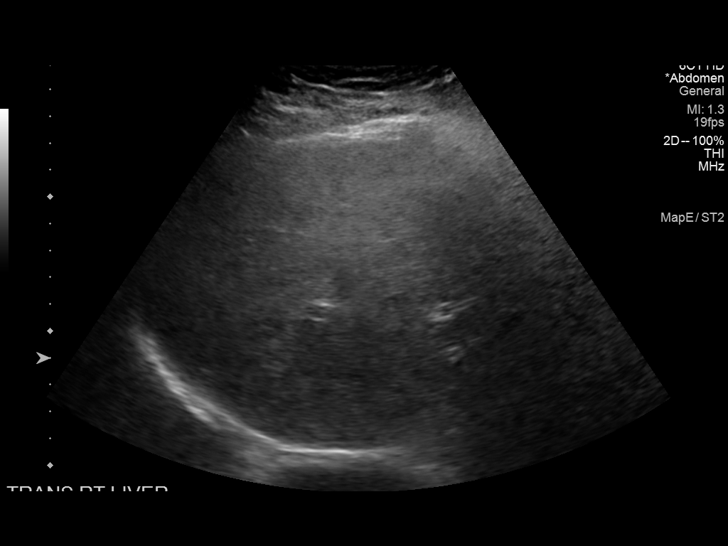
[im 37/55]
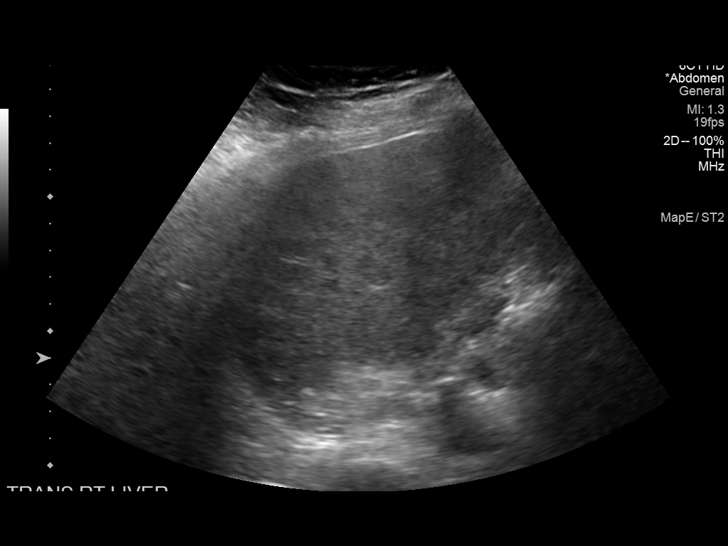
[im 41/55]
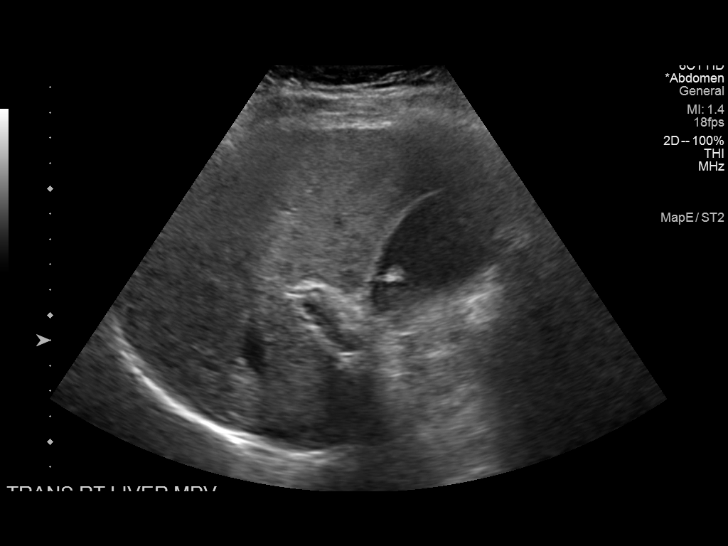
[im 46/55]
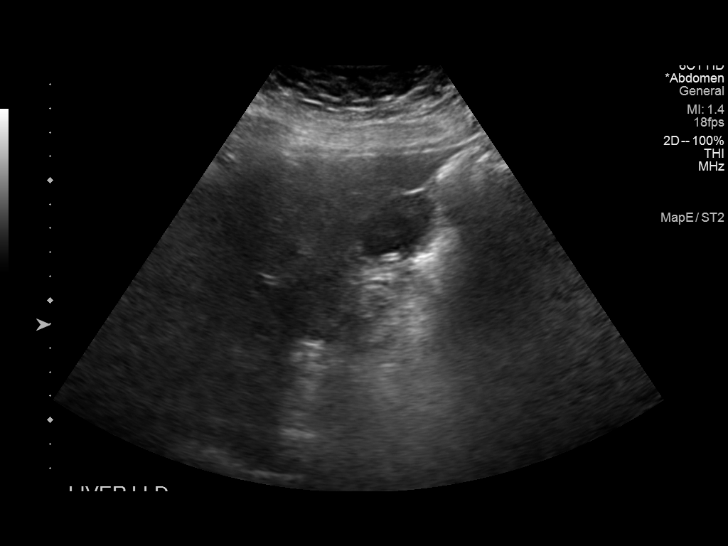
[im 50/55]
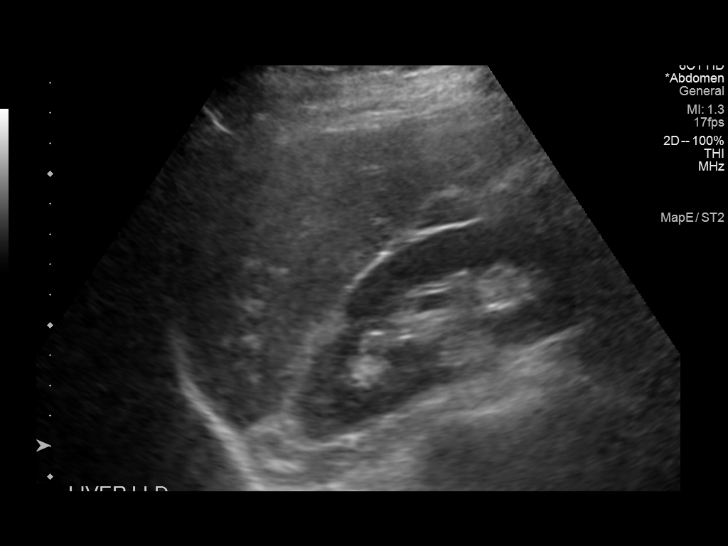
[im 55/55]
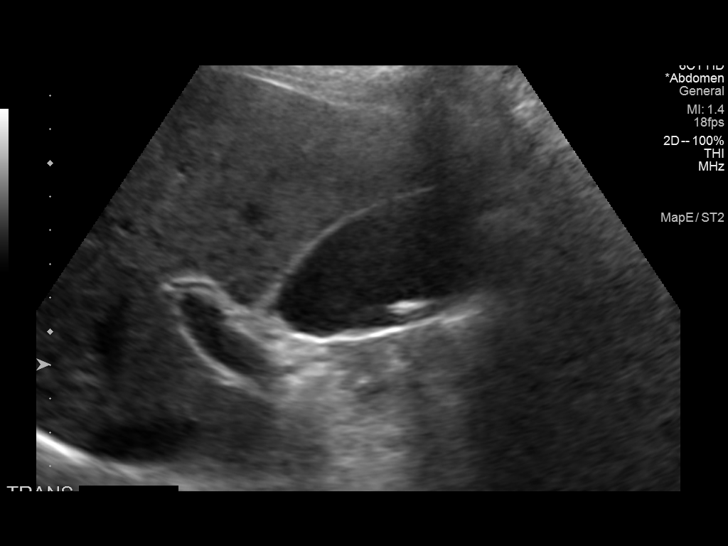

[14 of 25 positions shown; findings below may reference images not displayed]

FINDINGS: Gallbladder:

There are multiple shadowing intraluminal gallstones, measuring up
to 1.1 cm. There is a 0.9 cm nonshadowing echogenic foci along the
gallbladder wall which could represent stone or polyp. There is no
wall thickening or pericholecystic fluid. Negative sonographic
Murphy sign.

Common bile duct:

Diameter: 3 mm

Liver:

Diffusely increased liver echogenicity. Portal vein is patent on
color Doppler imaging with normal direction of blood flow towards
the liver.

Other: None.
IMPRESSION: Cholelithiasis, largest stone measuring up to 1.1 cm. No evidence of
acute cholecystitis. Additional 0.9 cm echogenic focus along the
gallbladder wall which could represent a or polyp. Recommend
follow-up ultrasound in 12 months. This recommendation follows ACR
consensus guidelines: White Paper of the ACR Incidental findings
Committee II on Gallbladder and Biliary Findings. [HOSPITAL]

Hepatic steatosis.

## 2023-06-25 ENCOUNTER — Other Ambulatory Visit: Payer: Self-pay

## 2023-06-25 DIAGNOSIS — I10 Essential (primary) hypertension: Secondary | ICD-10-CM

## 2023-06-27 MED ORDER — LISINOPRIL-HYDROCHLOROTHIAZIDE 20-25 MG PO TABS: 1.0000 | ORAL_TABLET | Freq: Every day | ORAL | 3 refills | Status: DC

## 2023-07-26 ENCOUNTER — Other Ambulatory Visit: Payer: Self-pay

## 2023-07-26 NOTE — Progress Notes (Signed)
   Paula Austin February 05, 1966 [id number]  Patient outreached by Sofie Rower, PharmD on 07/26/2023  Blood Pressure Readings: Last documented ambulatory systolic blood pressure: 179 Last documented ambulatory diastolic blood pressure: 109 Does the patient have a validated home blood pressure machine?: Yes They reported their husband has access to a blood pressure machine. She will try to log her BP readings daily.   Medication review was performed. Is the patient taking their medications as prescribed?: Yes Differences from their prescribed list include: Patient is considering starting Famotidine over the counter.   The following barriers to adherence were noted: Does the patient have cost concerns?: No Does the patient have transportation concerns?: No Does the patient need assistance obtaining refills?: No Does the patient occassionally forget to take some of their prescribed medications?: No Does the patient feel like one/some of their medications make them feel poorly?: No Does the patient have questions or concerns about their medications?: Yes Does the patient have a follow up scheduled with their primary care provider/cardiologist?: Yes   Interventions: Interventions Completed: Medications were reviewed, Patient was educated on proper technique to check home blood pressure and reminded to bring home machine and readings to next provider appointment, Patient was counseled on lifestyle modifications to improve blood pressure, including getting 150 minutes of exercise per week and incorporating DASH diet.   The patient has follow up scheduled:  PCP: Caro Laroche, DO   Gwenlyn Found, [facility]

## 2023-07-29 ENCOUNTER — Encounter: Payer: Self-pay | Admitting: Family Medicine

## 2023-07-29 ENCOUNTER — Ambulatory Visit (INDEPENDENT_AMBULATORY_CARE_PROVIDER_SITE_OTHER): Payer: No Typology Code available for payment source | Admitting: Family Medicine

## 2023-07-29 ENCOUNTER — Other Ambulatory Visit: Payer: Self-pay

## 2023-07-29 VITALS — BP 119/90 | HR 88 | Ht 67.0 in | Wt 299.0 lb

## 2023-07-29 DIAGNOSIS — I10 Essential (primary) hypertension: Secondary | ICD-10-CM

## 2023-07-29 DIAGNOSIS — Z9884 Bariatric surgery status: Secondary | ICD-10-CM

## 2023-07-29 DIAGNOSIS — R7303 Prediabetes: Secondary | ICD-10-CM | POA: Diagnosis not present

## 2023-07-29 DIAGNOSIS — E538 Deficiency of other specified B group vitamins: Secondary | ICD-10-CM | POA: Diagnosis not present

## 2023-07-29 DIAGNOSIS — G4733 Obstructive sleep apnea (adult) (pediatric): Secondary | ICD-10-CM

## 2023-07-29 DIAGNOSIS — E890 Postprocedural hypothyroidism: Secondary | ICD-10-CM | POA: Diagnosis not present

## 2023-07-29 MED ORDER — ZEPBOUND 2.5 MG/0.5ML ~~LOC~~ SOAJ: 2.5000 mg | SUBCUTANEOUS | 0 refills | Status: DC

## 2023-07-29 NOTE — Assessment & Plan Note (Signed)
Will trial zepbound. Obtaining labs. Continue weight loss efforts. F/u 4 weeks.

## 2023-07-29 NOTE — Assessment & Plan Note (Signed)
Recheck labs and adjust regimen as indicated.

## 2023-07-29 NOTE — Assessment & Plan Note (Signed)
Repeat sleep study

## 2023-07-29 NOTE — Assessment & Plan Note (Signed)
Recheck A1c 

## 2023-07-29 NOTE — Progress Notes (Signed)
   SUBJECTIVE:   CHIEF COMPLAINT / HPI:   OSA - had sleep study many years ago. Had weight loss surgery but did not have repeat study. Does not have CPAP currently. Snores at night.  Obesity - Weight is creeping up. Always been larger but had difficulty with losing weight since thyroidectomy. Had Roux-en-Y surgery in 2014. FH diabetes. Currently prediabetic. Drinking water and exercising. Has wellness program with work. Tried to get zepbound or wegovy but insurance didn't cover. Now has new insurance and wants to retry.  Hypertension: - Medications: lisinopril-hctz - Compliance: good - does urinate often   OBJECTIVE:   BP (!) 119/90   Pulse 88   Ht 5\' 7"  (1.702 m)   Wt 299 lb (135.6 kg)   LMP 04/29/2016   SpO2 98%   BMI 46.83 kg/m   Gen: well appearing, in NAD Card: Reg rate Lungs: Comfortable WOB on RA Ext: WWP   ASSESSMENT/PLAN:   Hypertension Doing well on current regimen, no changes made today.  OSA (obstructive sleep apnea) Repeat sleep study.  Post-surgical hypothyroidism Recheck labs and adjust regimen as indicated.  Prediabetes Recheck A1c.  Morbid obesity ([facility]) Will trial zepbound. Obtaining labs. Continue weight loss efforts. F/u 4 weeks.     Paula Laroche, DO

## 2023-07-29 NOTE — Patient Instructions (Signed)
It was great to see you!  Our plans for today:  - We are getting a sleep study. Someone will call you to schedule this.  - We are sending Zepbound to your pharmacy. Let us know if this is too expensive. - We are checking some labs today, we will release these results to your MyChart. - Come back in 4 weeks.  Take care and seek immediate care sooner if you develop any concerns.   Dr. Linwood Dibbles

## 2023-07-29 NOTE — Assessment & Plan Note (Signed)
Doing well on current regimen, no changes made today. 

## 2023-07-30 MED ORDER — LEVOTHYROXINE SODIUM 150 MCG PO TABS
150.0000 ug | ORAL_TABLET | Freq: Every day | ORAL | 0 refills | Status: DC
Start: 2023-07-30 — End: 2024-01-22

## 2023-07-30 NOTE — Addendum Note (Signed)
Addended by: Caro Laroche on: 07/30/2023 09:34 AM   Modules accepted: Orders

## 2023-08-02 ENCOUNTER — Other Ambulatory Visit (HOSPITAL_COMMUNITY): Payer: Self-pay

## 2023-08-02 ENCOUNTER — Telehealth: Payer: Self-pay

## 2023-08-02 DIAGNOSIS — I10 Essential (primary) hypertension: Secondary | ICD-10-CM

## 2023-08-02 DIAGNOSIS — R7303 Prediabetes: Secondary | ICD-10-CM

## 2023-08-02 DIAGNOSIS — G4733 Obstructive sleep apnea (adult) (pediatric): Secondary | ICD-10-CM

## 2023-08-02 DIAGNOSIS — E785 Hyperlipidemia, unspecified: Secondary | ICD-10-CM

## 2023-08-02 NOTE — Telephone Encounter (Signed)
Pharmacy Patient Advocate Encounter   Received notification from CoverMyMeds that prior authorization for ZEPBOUND is required/requested.   Insurance verification completed.   The patient is insured through [employer] [facility] .   Per test claim: PA required; PA submitted to [employer] via CoverMyMeds Key/confirmation #/[id number]. Status is pending

## 2023-08-05 NOTE — Telephone Encounter (Signed)
Pharmacy Patient Advocate Encounter  Received notification from [employer] that Prior Authorization for ZEPBOUND has been DENIED. Please advise how you'd like to proceed. Full denial letter will be uploaded to the media tab. See denial reason below.    PA #/Case ID/Reference #: [id number]

## 2023-08-09 NOTE — Telephone Encounter (Signed)
Patient returns call to nurse line for PCP.   She reports she is amendable to trying Wegovy since Zepbound was denied.   Will forward to PCP.

## 2023-08-14 MED ORDER — WEGOVY 0.25 MG/0.5ML ~~LOC~~ SOAJ
0.2500 mg | SUBCUTANEOUS | 0 refills | Status: DC
Start: 2023-08-14 — End: 2023-10-21

## 2023-08-14 NOTE — Addendum Note (Signed)
Addended by: Caro Laroche on: 08/14/2023 03:21 PM   Modules accepted: Orders

## 2023-08-14 NOTE — Telephone Encounter (Signed)
[facility] sent to aid in weight loss in morbid obesity with comorbidities HLD, prediabetes, OSA, HTN. H/o bariatric surgery.

## 2023-09-11 NOTE — Progress Notes (Signed)
02/26/23- -----Paula Austin 13 yoF never smoker for sleep evaluation courtesy of Dr Janit Pagan with concern of OSA Medical problem list includes HTN, Cholelithiasis, Hypothyroid, Morbid Obesity, hx Bariatric RnY 2014, Hyperlipidemia,  NPSG 06/13/11- AHI 101.2/ hr, desaturation to 68%, body weight 320 lbs, CPAP to 17 Epworth score-13 Body weight today-300 lbs. Patient had a sleep study in 2012. Was on a cpap machine previously, stopped using machine over a year ago. Patient is interested in [place] After bariatric surgery in 2014 she lost weight and adjusted away from CPAP.  She was finding it made her swallow air, suggesting pressure at that point was too high.  Also mask was irritating and it does not sound as if she got any help with refitting of mask. ENT surgery limited to thyroid resection.  Denies heart or lung disease.  No sleep medicines.  Drinks sweet tea but no other stimulants.  Family tells her that her snoring is very loud.  Some daytime sleepiness.  I reviewed the basics of sleep apnea, medical concerns, contributing factors, diagnostic and treatment options.  Because she is interested in [place] I recommended a split-night sleep study since some insurances will not except a home sleep test for this consideration.  09/12/23-  57 yoF never smoker followed for OSA, complicated by HTN, Cholelithiasis, Hypothyroid, Morbid Obesity, hx Bariatric RnY 2014, Hyperlipidemia,  NPSG 06/13/11- AHI 101.2/ hr, desaturation to 68%, body weight 320 lbs, CPAP to 17 Body weight today-299 lbs Has not followed through on orders this year for either HST or Split-nght study. She had been interested in [place]. Arrival BP 150/90. Sleep study was deferred due to insurance change, but ok to go ahead now.  Discussed diuretic. She wants to separate from other meds, to see if she can skip it when she has to drive. Remains interested in [place], but too heavy. Encouraged weight loss effort.   ROS-see HPI   + =  positive Constitutional:    weight loss, night sweats, fevers, chills, fatigue, lassitude. HEENT:    headaches, difficulty swallowing, tooth/dental problems, sore throat,       sneezing, itching, ear ache, nasal congestion, post nasal drip, snoring CV:    chest pain, orthopnea, PND, swelling in lower extremities, anasarca,                                  dizziness, palpitations Resp:   shortness of breath with exertion or at rest.                productive cough,   non-productive cough, coughing up of blood.              change in color of mucus.  wheezing.   Skin:    rash or lesions. GI:  No-   heartburn, indigestion, abdominal pain, nausea, vomiting, diarrhea,                 change in bowel habits, loss of appetite GU: dysuria, change in color of urine, no urgency or frequency.   flank pain. MS:   joint pain, +stiffness, decreased range of motion, back pain. Neuro-     nothing unusual Psych:  change in mood or affect.  depression or anxiety.   memory loss.  OBJ- Physical Exam General- Alert, Oriented, Affect-appropriate, Distress- none acute, +morbid obesity, +yawning, Skin- rash-none, lesions- none, excoriation- none Lymphadenopathy- none Head- atraumatic  Eyes- Gross vision intact, PERRLA, conjunctivae and secretions clear            Ears- Hearing, canals-normal            Nose- Clear, no-Septal dev, mucus, polyps, erosion, perforation             Throat- Mallampati IV , mucosa clear , drainage- none, tonsils- atrophic, +teeth Neck- flexible , trachea midline, no stridor , thyroid nl, carotid no bruit Chest - symmetrical excursion , unlabored           Heart/CV- RRR , no murmur , no gallop  , no rub, nl s1 s2                           - JVD- none , edema- none, stasis changes- none, varices- none           Lung- clear to P&A, wheeze- none, cough- none , dullness-none, rub- none           Chest wall-  Abd-  Br/ Gen/ Rectal- Not done, not indicated Extrem- cyanosis-  none, clubbing, none, atrophy- none, strength- nl Neuro- grossly intact to observation

## 2023-09-12 ENCOUNTER — Ambulatory Visit: Payer: No Typology Code available for payment source | Admitting: Internal Medicine

## 2023-09-12 ENCOUNTER — Encounter: Payer: Self-pay | Admitting: Internal Medicine

## 2023-09-12 VITALS — BP 150/90 | HR 78 | Temp 97.4°F | Ht 67.0 in | Wt 299.0 lb

## 2023-09-12 DIAGNOSIS — G4733 Obstructive sleep apnea (adult) (pediatric): Secondary | ICD-10-CM

## 2023-09-12 NOTE — Patient Instructions (Signed)
Order- schedule split night sleep study     dx OSA  Please call us about 2 weeks after your study for resuslts and recommendations

## 2023-09-18 ENCOUNTER — Encounter: Payer: Self-pay | Admitting: Internal Medicine

## 2023-09-18 NOTE — Assessment & Plan Note (Signed)
Remains significantly obese despite past hx bariatric surgery Plan- continue efforts. Discuss semaglutide with PCP.

## 2023-09-18 NOTE — Assessment & Plan Note (Signed)
Ready to proceed with sleep study Plan- schedule split night sleep study. Encourage weight loss so she can consider Inspire.

## 2023-09-24 ENCOUNTER — Encounter: Payer: Self-pay | Admitting: Internal Medicine

## 2023-09-24 ENCOUNTER — Encounter: Payer: Self-pay | Admitting: Family Medicine

## 2023-09-24 ENCOUNTER — Other Ambulatory Visit (HOSPITAL_COMMUNITY): Payer: Self-pay

## 2023-10-02 ENCOUNTER — Encounter: Payer: Self-pay | Admitting: Orthopaedic Surgery

## 2023-10-02 ENCOUNTER — Ambulatory Visit: Payer: No Typology Code available for payment source | Admitting: Orthopaedic Surgery

## 2023-10-02 DIAGNOSIS — M1712 Unilateral primary osteoarthritis, left knee: Secondary | ICD-10-CM

## 2023-10-02 MED ORDER — PREDNISONE 50 MG PO TABS: ORAL_TABLET | ORAL | 0 refills | Status: DC

## 2023-10-02 NOTE — Progress Notes (Signed)
The patient is well-known to me.  She is 57 years old and has significant arthritis involving her left knee.  It has been almost a year since she has had hyaluronic acid in that left knee.  She is starting to have pain with swelling in that knee and she would like to have hyaluronic acid again.  She has tried and failed other forms conservative treatment including steroid injections in the past.  Those have not helped her 1 day.  On exam she does have a mild effusion of her left knee with pain throughout the arc of motion of that knee.  It is quite significant.  I agree with the need for hyaluronic acid for her left knee since that is helped her for the last year.  Will work on ordering that for her.  I will at least try some oral prednisone to 50 mg daily for 5 days in the interim.  We will see her back to place an injection in her knee when it is approved and here.  I would like a weight and BMI calculation at her next visit.  This patient is diagnosed with osteoarthritis of the knee(s).    Radiographs show evidence of joint space narrowing, osteophytes, subchondral sclerosis and/or subchondral cysts.  This patient has knee pain which interferes with functional and activities of daily living.    This patient has experienced inadequate response, adverse effects and/or intolerance with conservative treatments such as acetaminophen, NSAIDS, topical creams, physical therapy or regular exercise, knee bracing and/or weight loss.   This patient has experienced inadequate response or has a contraindication to intra articular steroid injections for at least 3 months.   This patient is not scheduled to have a total knee replacement within 6 months of starting treatment with viscosupplementation.

## 2023-10-03 ENCOUNTER — Telehealth: Payer: Self-pay

## 2023-10-03 NOTE — Telephone Encounter (Signed)
Left knee gel injection ?

## 2023-10-07 NOTE — Telephone Encounter (Signed)
Called pharmacy to check into issue further. There were two issues.  Medication is not covered on insurance formulary. Per pharmacy, PA is not an option.  Starting dose of medication is not available in stock due to back orders.   Will forward to PCP for further advisement.   Veronda Prude, RN

## 2023-10-09 NOTE — Telephone Encounter (Signed)
Patient was seen 02/26/23 and split night was order but insurance denied and HST order was placed on 05/03/23 and sent to [facility]. [facility]'s website states can't reach the patient. Patient was seen again and neither of the sleep studies had been done. Dr. Maple Hudson placed a new order for split night and it doesn't have a PA yet

## 2023-10-09 NOTE — Telephone Encounter (Signed)
VOB submitted for Monovisc, left knee 

## 2023-10-11 ENCOUNTER — Encounter: Payer: Self-pay | Admitting: Family Medicine

## 2023-10-11 ENCOUNTER — Ambulatory Visit: Payer: No Typology Code available for payment source | Admitting: Family Medicine

## 2023-10-11 VITALS — BP 123/84 | HR 89 | Ht 67.0 in

## 2023-10-11 DIAGNOSIS — G4733 Obstructive sleep apnea (adult) (pediatric): Secondary | ICD-10-CM

## 2023-10-11 DIAGNOSIS — K8020 Calculus of gallbladder without cholecystitis without obstruction: Secondary | ICD-10-CM

## 2023-10-11 DIAGNOSIS — Z9884 Bariatric surgery status: Secondary | ICD-10-CM | POA: Diagnosis not present

## 2023-10-11 DIAGNOSIS — Z23 Encounter for immunization: Secondary | ICD-10-CM

## 2023-10-11 DIAGNOSIS — R1011 Right upper quadrant pain: Secondary | ICD-10-CM

## 2023-10-11 DIAGNOSIS — E890 Postprocedural hypothyroidism: Secondary | ICD-10-CM

## 2023-10-11 DIAGNOSIS — I10 Essential (primary) hypertension: Secondary | ICD-10-CM | POA: Diagnosis not present

## 2023-10-11 NOTE — Telephone Encounter (Signed)
 Discussed with patient during OV

## 2023-10-11 NOTE — Assessment & Plan Note (Signed)
Await sleep study. Per patient and sleep medicine, wanting Inspire.

## 2023-10-11 NOTE — Assessment & Plan Note (Signed)
Recheck TSH 

## 2023-10-11 NOTE — Assessment & Plan Note (Signed)
Refer to medical weight management. Discussed trying copay cards for wegovy or zepbound, unfortunately no insurance coverage.

## 2023-10-11 NOTE — Assessment & Plan Note (Signed)
Becoming more symptomatic. Negative murphy sign today. Repeat RUQ [place], likely surgical referral following.

## 2023-10-11 NOTE — Progress Notes (Signed)
   SUBJECTIVE:   CHIEF COMPLAINT / HPI:   ABDOMINAL ISSUES - h/o gallstones on RUQ [place] 09/2021 - having ongoing intermittent RUQ pain, N/V, indigestion. 2 episodes in the last few months.  Duration: years Radiation: to R sided back Episode duration: hours Frequency: intermittent Alleviating factors: none reliably Aggravating factors: eating Treatments attempted: ibuprofen, pepcid, heating pad Constipation: no Diarrhea: no Heartburn: yes Bloating:yes Nausea: yes Vomiting: yes Melena or hematochezia: no Fever: no Weight loss: no Dysuria: no  Obesity - tried to get Zepbound and Wegovy thru insurance but doesn't cover any weight loss meds. Walking getting more difficult due to chronic knee pain, working to get monovisc injections as CSI don't work anymore. Has had good efforts with diet. H/o Roux-en-Y surgery 2014. Currently prediabetic with FH of diabetes.     OSA - working to get insurance approval for lab sleep study, waiting to hear back from sleep medicine. Wants inspire instead of CPAP due to sinu sissues.  Knee arthritis - waiting on insurance to authorize monovisc injection.   Hypothyroidism - initially had some jitteriness since increasing synthroid dose, doing well now. Reports good compliance.   OBJECTIVE:   BP 123/84   Pulse 89   Ht 5\' 7"  (1.702 m)   LMP 04/29/2016   SpO2 100%   BMI 46.83 kg/m   Gen: well appearing, in NAD Card: RRR Lungs: CTAB Abd: soft, +BS. Nondistended. Slight TTP in RUQ. Negative murphy sign. No CVA tenderness.  Ext: WWP, no edema   ASSESSMENT/PLAN:   OSA (obstructive sleep apnea) Await sleep study. Per patient and sleep medicine, wanting Inspire.  Cholelithiasis Becoming more symptomatic. Negative murphy sign today. Repeat RUQ [place], likely surgical referral following.   Post-surgical hypothyroidism Recheck TSH.  Morbid obesity (HCC) Refer to medical weight management. Discussed trying copay cards for wegovy or zepbound,  unfortunately no insurance coverage.      Caro Laroche, DO

## 2023-10-11 NOTE — Patient Instructions (Addendum)
It was great to see you!  Our plans for today:  - See if you qualify for a copay card through [facility] or Zepbound - [email or web address] - We are getting an ultrasound to look at your gallbladder.   We are checking some labs today, we will release these results to your MyChart.  Take care and seek immediate care sooner if you develop any concerns.   Dr. Linwood Dibbles

## 2023-10-12 LAB — TSH: TSH: 1.31 u[IU]/mL (ref 0.450–4.500)

## 2023-10-15 ENCOUNTER — Telehealth: Payer: Self-pay

## 2023-10-15 NOTE — Telephone Encounter (Signed)
Faxed completed PA form to [place] at [phone]. PA pending

## 2023-10-16 ENCOUNTER — Other Ambulatory Visit: Payer: Self-pay

## 2023-10-16 ENCOUNTER — Encounter: Payer: Self-pay | Admitting: Orthopaedic Surgery

## 2023-10-16 ENCOUNTER — Ambulatory Visit (INDEPENDENT_AMBULATORY_CARE_PROVIDER_SITE_OTHER): Payer: No Typology Code available for payment source | Admitting: Orthopaedic Surgery

## 2023-10-16 DIAGNOSIS — M1712 Unilateral primary osteoarthritis, left knee: Secondary | ICD-10-CM

## 2023-10-16 MED ORDER — LIDOCAINE HCL 1 % IJ SOLN
3.0000 mL | INTRAMUSCULAR | Status: AC | PRN
Start: 2023-10-16 — End: 2023-10-16
  Administered 2023-10-16: 3 mL

## 2023-10-16 MED ORDER — HYALURONAN 88 MG/4ML IX SOSY
88.0000 mg | PREFILLED_SYRINGE | INTRA_ARTICULAR | Status: AC | PRN
Start: 2023-10-16 — End: 2023-10-16
  Administered 2023-10-16: 88 mg via INTRA_ARTICULAR

## 2023-10-16 NOTE — Progress Notes (Signed)
   Procedure Note  Patient: Paula Austin             Date of Birth: 08-29-1966           MRN: [id number]             Visit Date: 10/16/2023  Procedures: Visit Diagnoses:  1. Unilateral primary osteoarthritis, left knee     Large Joint Inj: L knee on 10/16/2023 4:22 PM Indications: diagnostic evaluation and pain Details: 22 G 1.5 in needle, superolateral approach  Arthrogram: No  Medications: 3 mL lidocaine 1 %; 88 mg Hyaluronan 88 MG/4ML Outcome: tolerated well, no immediate complications Procedure, treatment alternatives, risks and benefits explained, specific risks discussed. Consent was given by the patient. Immediately prior to procedure a time out was called to verify the correct patient, procedure, equipment, support staff and site/side marked as required. Patient was prepped and draped in the usual sterile fashion.     The patient comes in today for a scheduled Monovisc injection with hyaluronic acid in her left knee to treat the pain from osteoarthritis.  She has had an injection like this about a year ago so she is fully aware of the risk and benefits of these types of injections.  She is doing well otherwise.  Her left knee shows no effusion today.  I did place Monovisc in the left knee without difficulty.  All questions and concerns were answered and addressed.  Follow-up for now is as needed.  She knows to come see [place] if things are worsening.  Lot number: [id number]

## 2023-10-17 ENCOUNTER — Ambulatory Visit
Admission: RE | Admit: 2023-10-17 | Discharge: 2023-10-17 | Disposition: A | Discharge status: Home / Self Care | Payer: No Typology Code available for payment source | Source: Ambulatory Visit | Attending: Family Medicine | Admitting: Family Medicine

## 2023-10-17 DIAGNOSIS — K8020 Calculus of gallbladder without cholecystitis without obstruction: Secondary | ICD-10-CM | POA: Diagnosis not present

## 2023-10-17 DIAGNOSIS — Z0289 Encounter for other administrative examinations: Secondary | ICD-10-CM

## 2023-10-17 DIAGNOSIS — R1011 Right upper quadrant pain: Secondary | ICD-10-CM

## 2023-10-17 NOTE — Addendum Note (Signed)
Addended by: Caro Laroche on: 10/17/2023 04:09 PM   Modules accepted: Orders

## 2023-10-21 ENCOUNTER — Ambulatory Visit (INDEPENDENT_AMBULATORY_CARE_PROVIDER_SITE_OTHER): Payer: No Typology Code available for payment source | Admitting: Family Medicine

## 2023-10-21 ENCOUNTER — Encounter: Payer: Self-pay | Admitting: Family Medicine

## 2023-10-21 VITALS — BP 146/89 | HR 85 | Temp 98.6°F | Ht 67.0 in | Wt 306.0 lb

## 2023-10-21 DIAGNOSIS — R635 Abnormal weight gain: Secondary | ICD-10-CM | POA: Insufficient documentation

## 2023-10-21 DIAGNOSIS — Z6841 Body Mass Index (BMI) 40.0 and over, adult: Secondary | ICD-10-CM

## 2023-10-21 DIAGNOSIS — E890 Postprocedural hypothyroidism: Secondary | ICD-10-CM

## 2023-10-21 DIAGNOSIS — I10 Essential (primary) hypertension: Secondary | ICD-10-CM | POA: Diagnosis not present

## 2023-10-21 DIAGNOSIS — Z9884 Bariatric surgery status: Secondary | ICD-10-CM | POA: Diagnosis not present

## 2023-10-21 DIAGNOSIS — E66813 Obesity, class 3: Secondary | ICD-10-CM

## 2023-10-21 DIAGNOSIS — E662 Morbid (severe) obesity with alveolar hypoventilation: Secondary | ICD-10-CM

## 2023-10-21 NOTE — Progress Notes (Signed)
Office: [phone]  /  Fax: [phone]   Initial Visit  Paula Austin was seen in clinic today to evaluate for obesity. She is interested in losing weight to improve overall health and reduce the risk of weight related complications. She presents today to review program treatment options, initial physical assessment, and evaluation.     She was referred by: PCP  When asked what else they would like to accomplish? She states: Improve energy levels and physical activity, Improve existing medical conditions, and Improve quality of life  Weight history: overweight since childhood; gained more weight post thyroidectomy mid 30s.  2014 had RYGB at  [facility] weighing 350 lb and lost to 250 lb.  DJD pain has limited walking.  Gained weight with menopause and sugar cravings have worsened.  Sedentary job.   When asked how has your weight affected you? 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   "had thick neck" , trouble with intubatuion sept sept 2014 also   Family history of diabetes mellitus 01/17/2023   GERD (gastroesophageal reflux disease)    occasional, none lately   Goiter    History of kidney stones    Hyperlipidemia    Hypertension    Hypothyroidism    Morbid obesity (HCC)    OSA on CPAP    uses cpap some nights, pt does know settings   PONV (postoperative nausea and vomiting) 08/17/2013    lots of coughing up phelgm   Sleep apnea    Thyroid disease      Objective:   BP (!) 146/89   Pulse 85   Temp 98.6 F (37 C)   Ht 5\' 7"  (1.702 m)   Wt (!) 306 lb (138.8 kg)   LMP 04/29/2016   SpO2 97%   BMI 47.93 kg/m  She was weighed on the bioimpedance scale: Body mass index is 47.93 kg/m.  Peak Weight:350lb , Body Fat%:54.9, Visceral Fat Rating:20, Weight trend over the last 12 months: Increasing  General:  Alert, oriented and cooperative. Patient is in no acute distress.  Respiratory: Normal respiratory effort, no problems with respiration noted   Gait: able to ambulate independently  Mental Status: Normal mood and affect. Normal behavior. Normal judgment and thought content.   DIAGNOSTIC DATA REVIEWED:  BMET    Component Value Date/Time   NA 138 07/29/2023 1224   K 4.0 07/29/2023 1224   CL 100 07/29/2023 1224   CO2 23 07/29/2023 1224   GLUCOSE 97 07/29/2023 1224   GLUCOSE 102 (H) 09/22/2018 0342   BUN 10 07/29/2023 1224   CREATININE 0.84 07/29/2023 1224   CREATININE 0.80 11/29/2016 0954   CALCIUM 10.0 07/29/2023 1224   GFRNONAA 87 08/12/2020 1112   GFRNONAA 85 11/29/2016 0954   GFRAA 100 08/12/2020 1112   GFRAA >89 11/29/2016 0954   Lab Results  Component Value Date   HGBA1C 5.9 (H) 07/29/2023   HGBA1C 5.4 11/29/2016   No results found for: "INSULIN" CBC    Component Value Date/Time   WBC 6.3 01/17/2023  1213   WBC 7.7 09/22/2018 0342   RBC 4.55 01/17/2023 1213   RBC 4.07 09/22/2018 0342   HGB 13.9 01/17/2023 1213   HCT 41.3 01/17/2023 1213   PLT 250 01/17/2023 1213   MCV 91 01/17/2023 1213   MCH 30.5 01/17/2023 1213   MCH 30.0 09/22/2018 0342   MCHC 33.7 01/17/2023 1213   MCHC 31.9 09/22/2018 0342   RDW 13.2 01/17/2023 1213   Iron/TIBC/Ferritin/ %Sat    Component Value Date/Time   FERRITIN 57 06/24/2019 1655   Lipid Panel     Component Value Date/Time   CHOL 212 (H) 01/17/2023 1213   TRIG 74 01/17/2023 1213   HDL 65 01/17/2023 1213    CHOLHDL 3.3 01/17/2023 1213   CHOLHDL 3.0 11/29/2016 0954   VLDL 14 11/29/2016 0954   LDLCALC 134 (H) 01/17/2023 1213   LDLDIRECT 100 (H) 04/16/2014 0945   Hepatic Function Panel     Component Value Date/Time   PROT 7.1 07/29/2023 1224   ALBUMIN 4.1 07/29/2023 1224   AST 17 07/29/2023 1224   ALT 15 07/29/2023 1224   ALKPHOS 98 07/29/2023 1224   BILITOT 0.3 07/29/2023 1224      Component Value Date/Time   TSH 1.310 10/11/2023 1227     Assessment and Plan:   Weight gain following gastric bypass surgery Assessment & Plan: S/p RYGB done at [facility] 2014 with pre op weight 350 lb and post op nadir weight 250 lb. Still has volume control at meals and some food aversions Has not taken anti obesity medication pre or post op  Will update annual bariatric labs next visit and will focus on lean protein with meals, reducing liquid calories and high sugar items   Class 3 obesity with alveolar hypoventilation, serious comorbidity, and body mass index (BMI) of 45.0 to 49.9 in adult [facility])  Primary hypertension Assessment & Plan: BP is elevated today.  She is on lisinopril/ hydrochlorothiazide 20/25 mg once daily Denies HA or CP Has some L knee pain today  Look for BP improvements with weight reduction   Post-surgical hypothyroidism Assessment & Plan: This started her weight gain in her mid 30s and she has struggled with her weight ever since.  Her levothyroxine dose was recently increased by per PCP and is now WNL.  Continue current dose of levothyroxine         Obesity Treatment / Action Plan:  Patient will work on garnering support from family and friends to begin weight loss journey. Will work on eliminating or reducing the presence of highly palatable, calorie dense foods in the home. Will complete provided nutritional and psychosocial assessment questionnaire before the next appointment. Will be scheduled for indirect calorimetry to determine resting energy expenditure  in a fasting state.  This will allow [place] to create a reduced calorie, high-protein meal plan to promote loss of fat mass while preserving muscle mass. Will think about ideas on how to incorporate physical activity into their daily routine. Counseled on the health benefits of losing 5%-15% of total body weight. Was counseled on nutritional approaches to weight loss and benefits of reducing processed foods and consuming plant-based foods and high quality protein as part of nutritional weight management. Was counseled on pharmacotherapy and role as an adjunct in weight management.   Obesity Education Performed Today:  She was weighed on the bioimpedance scale and results were discussed and documented in the synopsis.  We discussed obesity as a disease and the importance of a more detailed evaluation  of all the factors contributing to the disease.  We discussed the importance of long term lifestyle changes which include nutrition, exercise and behavioral modifications as well as the importance of customizing this to her specific health and social needs.  We discussed the benefits of reaching a healthier weight to alleviate the symptoms of existing conditions and reduce the risks of the biomechanical, metabolic and psychological effects of obesity.  Paula Austin appears to be in the action stage of change and states they are ready to start intensive lifestyle modifications and behavioral modifications.  30 minutes was spent today on this visit including the above counseling, pre-visit chart review, and post-visit documentation.  Reviewed by clinician on day of visit: allergies, medications, problem list, medical history, surgical history, family history, social history, and previous encounter notes pertinent to obesity diagnosis.    Seymour Bars, D.O. DABFM, DABOM [facility] [phone] [place], [place] [address] [phone]

## 2023-10-21 NOTE — Assessment & Plan Note (Signed)
BP is elevated today.  She is on lisinopril/ hydrochlorothiazide 20/25 mg once daily Denies HA or CP Has some L knee pain today  Look for BP improvements with weight reduction

## 2023-10-21 NOTE — Assessment & Plan Note (Signed)
This started her weight gain in her mid 30s and she has struggled with her weight ever since.  Her levothyroxine dose was recently increased by per PCP and is now WNL.  Continue current dose of levothyroxine

## 2023-10-21 NOTE — Assessment & Plan Note (Signed)
S/p RYGB done at [facility] 2014 with pre op weight 350 lb and post op nadir weight 250 lb. Still has volume control at meals and some food aversions Has not taken anti obesity medication pre or post op  Will update annual bariatric labs next visit and will focus on lean protein with meals, reducing liquid calories and high sugar items

## 2023-10-23 ENCOUNTER — Ambulatory Visit: Payer: No Typology Code available for payment source | Admitting: Orthopaedic Surgery

## 2023-10-31 ENCOUNTER — Ambulatory Visit: Payer: No Typology Code available for payment source | Admitting: Family Medicine

## 2023-10-31 ENCOUNTER — Encounter: Payer: Self-pay | Admitting: Family Medicine

## 2023-10-31 VITALS — BP 122/88 | HR 78 | Temp 98.0°F | Ht 67.0 in | Wt 298.0 lb

## 2023-10-31 DIAGNOSIS — Z1331 Encounter for screening for depression: Secondary | ICD-10-CM

## 2023-10-31 DIAGNOSIS — R5383 Other fatigue: Secondary | ICD-10-CM | POA: Diagnosis not present

## 2023-10-31 DIAGNOSIS — R7303 Prediabetes: Secondary | ICD-10-CM

## 2023-10-31 DIAGNOSIS — E785 Hyperlipidemia, unspecified: Secondary | ICD-10-CM | POA: Diagnosis not present

## 2023-10-31 DIAGNOSIS — K909 Intestinal malabsorption, unspecified: Secondary | ICD-10-CM

## 2023-10-31 DIAGNOSIS — R635 Abnormal weight gain: Secondary | ICD-10-CM

## 2023-10-31 DIAGNOSIS — R0602 Shortness of breath: Secondary | ICD-10-CM

## 2023-10-31 DIAGNOSIS — Z6841 Body Mass Index (BMI) 40.0 and over, adult: Secondary | ICD-10-CM | POA: Diagnosis not present

## 2023-10-31 DIAGNOSIS — E66813 Obesity, class 3: Secondary | ICD-10-CM | POA: Diagnosis not present

## 2023-10-31 DIAGNOSIS — Z9884 Bariatric surgery status: Secondary | ICD-10-CM

## 2023-10-31 DIAGNOSIS — I10 Essential (primary) hypertension: Secondary | ICD-10-CM | POA: Diagnosis not present

## 2023-10-31 NOTE — Assessment & Plan Note (Signed)
She has some volume restriction with mealtime from her gastric bypass surgery.  She is currently taking a multivitamin.  She denies previously needing IV iron infusions or oral iron.  She has reduced her B12 to every other day.  She denies any postop complications of ulcers.  She has been hungrier, consuming more snacks and sugar sweetened beverages between meals.  Will be working on getting back on track with a post bariatric surgery diet including lean protein with each meal, avoiding sugar sweetened beverages, drinking water outside of mealtime, adding in weight training.  Continue a multivitamin once daily and update labs today.

## 2023-10-31 NOTE — Progress Notes (Signed)
At a Glance:  Vitals Temp: 98 F (36.7 C) BP: 122/88 Pulse Rate: 78 SpO2: 99 %   Anthropometric Measurements Height: 5\' 7"  (1.702 m) Weight: 298 lb (135.2 kg) BMI (Calculated): 46.66 Starting Weight: 298lb Peak Weight: 350lb   Body Composition  Body Fat %: 51.5 % Fat Mass (lbs): 153.6 lbs Muscle Mass (lbs): 137.6 lbs Visceral Fat Rating : 19   Other Clinical Data Fasting: Yes Labs: Yes Today's Visit #: 1 Starting Date: 10/31/23    EKG: Normal sinus rhythm, rate 74.  Indirect Calorimeter completed today shows a VO2 of 281 and a REE of 1944.  Her calculated basal metabolic rate is 7829 thus her basal metabolic rate is worse than expected.  Chief Complaint:  Obesity   Subjective:  Paula Austin (MR# [id number]) is a 57 y.o. female who presents for evaluation and treatment of obesity and related comorbidities.   Paula Austin is currently in the action stage of change and ready to dedicate time achieving and maintaining a healthier weight. Paula Austin is interested in becoming our patient and working on intensive lifestyle modifications including (but not limited to) diet and exercise for weight loss.  Paula Austin has been struggling with her weight. She has been unsuccessful in either losing weight, maintaining weight loss, or reaching her healthy weight goal. She   Paula Austin's habits were reviewed today and are as follows: she has been heavy most of her life, she started gaining weight in childhood, she has significant food cravings issues, she snacks frequently in the evenings, she wakes up frequently in the middle of the night to eat, she skips meals frequently, she is frequently drinking liquids with calories, and she has problems with excessive hunger.  She has struggled with weight regain status post gastric bypass surgery.  She maintained her body weight around 260 pounds, losing from her max weight of 350 pounds.  She started to regain weight with the introduction of  sugar sweetened beverages and other sweets.  She has also been less physically active.  Other Fatigue Paula Austin admits to daytime somnolence and admits to waking up still tired. Patient has a history of symptoms of daytime fatigue. Hindy generally gets 6 hours of sleep per night, and states that she has nightime awakenings. Snoring is present. Apneic episodes is present. Epworth Sleepiness Score is 13.  She is scheduled to do in lab sleep study with Dr. Maple Hudson.  She is currently not wearing CPAP for known diagnosis of OSA.  She is looking at the inspire device.  Shortness of Breath Paula Austin notes increasing shortness of breath with exercising and seems to be worsening over time with weight gain. She notes getting out of breath sooner with activity than she used to. This has gotten worse recently. Mirta denies shortness of breath at rest or orthopnea.   Depression Screen Paula Austin's Food and Mood (modified PHQ-9) score was 11.     10/11/2023    8:59 AM  Depression screen PHQ 2/9  Decreased Interest 0  Down, Depressed, Hopeless 0  PHQ - 2 Score 0  Tired, decreased energy 2  Change in appetite 0  Feeling bad or failure about yourself  0  Trouble concentrating 0  Moving slowly or fidgety/restless 0  Suicidal thoughts 0     Assessment and Plan:   Other Fatigue Paula Austin does feel that her weight is causing her energy to be lower than it should be. Fatigue may be related to obesity, depression or many other causes. Labs will be ordered,  and in the meanwhile, Paula Austin will focus on self care including making healthy food choices, increasing physical activity and focusing on stress reduction.  Shortness of Breath Paula Austin does feel that she gets out of breath more easily that she used to when she exercises. Paula Austin's shortness of breath appears to be obesity related and exercise induced. She has agreed to work on weight loss and gradually increase exercise to treat her exercise induced shortness  of breath. Will continue to monitor closely.  Paula Austin had a positive depression screening. Depression is commonly associated with obesity and often results in emotional eating behaviors. We will monitor this closely and work on CBT to help improve the non-hunger eating patterns. Referral to Psychology may be required if no improvement is seen as she continues in our clinic.   Problem List Items Addressed This Visit     Hypertension    Blood pressure is well-controlled on lisinopril/HCTZ 20/25 mg once daily. She denies leg edema, chest pain or headaches  Look for improving blood pressure readings with weight reduction      Hyperlipidemia    Lab Results  Component Value Date   CHOL 212 (H) 01/17/2023   HDL 65 01/17/2023   LDLCALC 134 (H) 01/17/2023   LDLDIRECT 100 (H) 04/16/2014   TRIG 74 01/17/2023   CHOLHDL 3.3 01/17/2023  Currently not on lipid-lowering medication.  Her last LDL was elevated.  Begin prescribed diet which is low in saturated fat, actively working on weight loss and regular exercise.  Recheck lipid panel today The 10-year ASCVD risk score (Arnett DK, et al., 2019) is: 5.2%   Values used to calculate the score:     Age: 28 years     Sex: Female     Is Non-Hispanic African American: Yes     Diabetic: No     Tobacco smoker: No     Systolic Blood Pressure: 122 mmHg     Is BP treated: Yes     HDL Cholesterol: 65 mg/dL     Total Cholesterol: 212 mg/dL       Relevant Orders   Lipid panel   Prediabetes    Lab Results  Component Value Date   HGBA1C 5.9 (H) 07/29/2023   She has been in the prediabetic range, currently not on any treatment.  She admits to can over consuming added sugar and starchy foods.  She has not been consistent with exercise but plans to start a walking group at work.  Begin prescribed diet.  Recheck labs today.  Look for improvements with prediabetes over the next 6 months      Relevant Orders   Insulin, random   Hemoglobin A1c   Weight  gain following gastric bypass surgery    She has some volume restriction with mealtime from her gastric bypass surgery.  She is currently taking a multivitamin.  She denies previously needing IV iron infusions or oral iron.  She has reduced her B12 to every other day.  She denies any postop complications of ulcers.  She has been hungrier, consuming more snacks and sugar sweetened beverages between meals.  Will be working on getting back on track with a post bariatric surgery diet including lean protein with each meal, avoiding sugar sweetened beverages, drinking water outside of mealtime, adding in weight training.  Continue a multivitamin once daily and update labs today.      Other Visit Diagnoses     SOBOE (shortness of breath on exertion)    -  Primary  Other fatigue       Relevant Orders   EKG 12-Lead   VITAMIN D 25 Hydroxy (Vit-D Deficiency, Fractures)   Depression screen       Intestinal malabsorption, unspecified type       Relevant Orders   Folate   Vitamin B12   CBC with Differential/Platelet   Prealbumin   Vitamin B1   Ferritin   Iron and TIBC   Class 3 severe obesity due to excess calories with serious comorbidity and body mass index (BMI) of 45.0 to 49.9 in adult [facility])           Problem List Items Addressed This Visit     Hypertension    Blood pressure is well-controlled on lisinopril/HCTZ 20/25 mg once daily. She denies leg edema, chest pain or headaches  Look for improving blood pressure readings with weight reduction      Hyperlipidemia    Lab Results  Component Value Date   CHOL 212 (H) 01/17/2023   HDL 65 01/17/2023   LDLCALC 134 (H) 01/17/2023   LDLDIRECT 100 (H) 04/16/2014   TRIG 74 01/17/2023   CHOLHDL 3.3 01/17/2023  Currently not on lipid-lowering medication.  Her last LDL was elevated.  Begin prescribed diet which is low in saturated fat, actively working on weight loss and regular exercise.  Recheck lipid panel today The 10-year ASCVD risk  score (Arnett DK, et al., 2019) is: 5.2%   Values used to calculate the score:     Age: 39 years     Sex: Female     Is Non-Hispanic African American: Yes     Diabetic: No     Tobacco smoker: No     Systolic Blood Pressure: 122 mmHg     Is BP treated: Yes     HDL Cholesterol: 65 mg/dL     Total Cholesterol: 212 mg/dL       Relevant Orders   Lipid panel   Prediabetes    Lab Results  Component Value Date   HGBA1C 5.9 (H) 07/29/2023   She has been in the prediabetic range, currently not on any treatment.  She admits to can over consuming added sugar and starchy foods.  She has not been consistent with exercise but plans to start a walking group at work.  Begin prescribed diet.  Recheck labs today.  Look for improvements with prediabetes over the next 6 months      Relevant Orders   Insulin, random   Hemoglobin A1c   Weight gain following gastric bypass surgery    She has some volume restriction with mealtime from her gastric bypass surgery.  She is currently taking a multivitamin.  She denies previously needing IV iron infusions or oral iron.  She has reduced her B12 to every other day.  She denies any postop complications of ulcers.  She has been hungrier, consuming more snacks and sugar sweetened beverages between meals.  Will be working on getting back on track with a post bariatric surgery diet including lean protein with each meal, avoiding sugar sweetened beverages, drinking water outside of mealtime, adding in weight training.  Continue a multivitamin once daily and update labs today.      Other Visit Diagnoses     SOBOE (shortness of breath on exertion)    -  Primary   Other fatigue       Relevant Orders   EKG 12-Lead   VITAMIN D 25 Hydroxy (Vit-D Deficiency, Fractures)   Depression screen  Intestinal malabsorption, unspecified type       Relevant Orders   Folate   Vitamin B12   CBC with Differential/Platelet   Prealbumin   Vitamin B1   Ferritin   Iron  and TIBC   Class 3 severe obesity due to excess calories with serious comorbidity and body mass index (BMI) of 45.0 to 49.9 in adult [facility])           Kathyann is currently in the action stage of change and her goal is to continue with weight loss efforts. I recommend Zanaria begin the structured treatment plan as follows:  She has agreed to Category 3 Plan  Exercise goals: All adults should avoid inactivity. Some activity is better than none, and adults who participate in any amount of physical activity, gain some health benefits.  Behavioral modification strategies:increasing lean protein intake, increasing vegetables, increase H2O intake, decrease liquid calories, no skipping meals, meal planning and cooking strategies, better snacking choices, and emotional eating strategies   She was informed of the importance of frequent follow-up visits to maximize her success with intensive lifestyle modifications for her multiple health conditions. She was informed we would discuss her lab results at her next visit unless there is a critical issue that needs to be addressed sooner. Shelana agreed to keep her next visit at the agreed upon time to discuss these results.  Objective:  General: Cooperative, alert, well developed, in no acute distress. HEENT: Conjunctivae and lids unremarkable. Cardiovascular: Regular rhythm.  Lungs: Normal work of breathing. Neurologic: No focal deficits.   Lab Results  Component Value Date   CREATININE 0.84 07/29/2023   BUN 10 07/29/2023   NA 138 07/29/2023   K 4.0 07/29/2023   CL 100 07/29/2023   CO2 23 07/29/2023   Lab Results  Component Value Date   ALT 15 07/29/2023   AST 17 07/29/2023   ALKPHOS 98 07/29/2023   BILITOT 0.3 07/29/2023   Lab Results  Component Value Date   HGBA1C 5.9 (H) 07/29/2023   HGBA1C 5.7 (A) 01/17/2023   HGBA1C 5.7 (A) 06/24/2019   HGBA1C 5.4 11/29/2016   No results found for: "INSULIN" Lab Results  Component Value Date    TSH 1.310 10/11/2023   Lab Results  Component Value Date   CHOL 212 (H) 01/17/2023   HDL 65 01/17/2023   LDLCALC 134 (H) 01/17/2023   LDLDIRECT 100 (H) 04/16/2014   TRIG 74 01/17/2023   CHOLHDL 3.3 01/17/2023   Lab Results  Component Value Date   WBC 6.3 01/17/2023   HGB 13.9 01/17/2023   HCT 41.3 01/17/2023   MCV 91 01/17/2023   PLT 250 01/17/2023   Lab Results  Component Value Date   FERRITIN 57 06/24/2019    Attestation Statements:  Reviewed by clinician on day of visit: allergies, medications, problem list, medical history, surgical history, family history, social history, and previous encounter notes.  Time spent on visit including pre-visit chart review and post-visit charting and care was 45 minutes.   Glennis Brink, DO

## 2023-10-31 NOTE — Assessment & Plan Note (Signed)
Lab Results  Component Value Date   CHOL 212 (H) 01/17/2023   HDL 65 01/17/2023   LDLCALC 134 (H) 01/17/2023   LDLDIRECT 100 (H) 04/16/2014   TRIG 74 01/17/2023   CHOLHDL 3.3 01/17/2023  Currently not on lipid-lowering medication.  Her last LDL was elevated.  Begin prescribed diet which is low in saturated fat, actively working on weight loss and regular exercise.  Recheck lipid panel today The 10-year ASCVD risk score (Arnett DK, et al., 2019) is: 5.2%   Values used to calculate the score:     Age: 36 years     Sex: Female     Is Non-Hispanic African American: Yes     Diabetic: No     Tobacco smoker: No     Systolic Blood Pressure: 122 mmHg     Is BP treated: Yes     HDL Cholesterol: 65 mg/dL     Total Cholesterol: 212 mg/dL

## 2023-10-31 NOTE — Assessment & Plan Note (Signed)
Lab Results  Component Value Date   HGBA1C 5.9 (H) 07/29/2023   She has been in the prediabetic range, currently not on any treatment.  She admits to can over consuming added sugar and starchy foods.  She has not been consistent with exercise but plans to start a walking group at work.  Begin prescribed diet.  Recheck labs today.  Look for improvements with prediabetes over the next 6 months

## 2023-10-31 NOTE — Assessment & Plan Note (Signed)
Blood pressure is well-controlled on lisinopril/HCTZ 20/25 mg once daily. She denies leg edema, chest pain or headaches  Look for improving blood pressure readings with weight reduction

## 2023-11-03 LAB — CBC WITH DIFFERENTIAL/PLATELET
Basophils Absolute: 0 10*3/uL (ref 0.0–0.2)
Basos: 1 %
EOS (ABSOLUTE): 0.1 10*3/uL (ref 0.0–0.4)
Eos: 3 %
Hematocrit: 42.1 % (ref 34.0–46.6)
Hemoglobin: 13.5 g/dL (ref 11.1–15.9)
Immature Grans (Abs): 0 10*3/uL (ref 0.0–0.1)
Immature Granulocytes: 0 %
Lymphocytes Absolute: 2.2 10*3/uL (ref 0.7–3.1)
Lymphs: 42 %
MCH: 30.5 pg (ref 26.6–33.0)
MCHC: 32.1 g/dL (ref 31.5–35.7)
MCV: 95 fL (ref 79–97)
Monocytes Absolute: 0.4 10*3/uL (ref 0.1–0.9)
Monocytes: 7 %
Neutrophils Absolute: 2.4 10*3/uL (ref 1.4–7.0)
Neutrophils: 47 %
Platelets: 297 10*3/uL (ref 150–450)
RBC: 4.42 x10E6/uL (ref 3.77–5.28)
RDW: 13.2 % (ref 11.7–15.4)
WBC: 5.2 10*3/uL (ref 3.4–10.8)

## 2023-11-03 LAB — LIPID PANEL
Chol/HDL Ratio: 3.6 ratio (ref 0.0–4.4)
Cholesterol, Total: 221 mg/dL — ABNORMAL HIGH (ref 100–199)
HDL: 61 mg/dL (ref >39)
LDL Chol Calc (NIH): 145 mg/dL — ABNORMAL HIGH (ref 0–99)
Triglycerides: 85 mg/dL (ref 0–149)
VLDL Cholesterol Cal: 15 mg/dL (ref 5–40)

## 2023-11-03 LAB — IRON AND TIBC
Iron Saturation: 32 % (ref 15–55)
Iron: 111 ug/dL (ref 27–159)
Total Iron Binding Capacity: 344 ug/dL (ref 250–450)
UIBC: 233 ug/dL (ref 131–425)

## 2023-11-03 LAB — PREALBUMIN: PREALBUMIN: 24 mg/dL (ref 10–36)

## 2023-11-03 LAB — VITAMIN D 25 HYDROXY (VIT D DEFICIENCY, FRACTURES): Vit D, 25-Hydroxy: 39.4 ng/mL (ref 30.0–100.0)

## 2023-11-03 LAB — FOLATE: Folate: 19.1 ng/mL (ref >3.0)

## 2023-11-03 LAB — HEMOGLOBIN A1C
Est. average glucose Bld gHb Est-mCnc: 126 mg/dL
Hgb A1c MFr Bld: 6 % — ABNORMAL HIGH (ref 4.8–5.6)

## 2023-11-03 LAB — FERRITIN: Ferritin: 48 ng/mL (ref 15–150)

## 2023-11-03 LAB — VITAMIN B12: Vitamin B-12: 1116 pg/mL (ref 232–1245)

## 2023-11-03 LAB — VITAMIN B1: Thiamine: 90.7 nmol/L (ref 66.5–200.0)

## 2023-11-03 LAB — INSULIN, RANDOM: INSULIN: 21.4 u[IU]/mL (ref 2.6–24.9)

## 2023-11-05 ENCOUNTER — Ambulatory Visit: Payer: Self-pay | Admitting: Surgery

## 2023-11-05 DIAGNOSIS — K824 Cholesterolosis of gallbladder: Secondary | ICD-10-CM | POA: Diagnosis not present

## 2023-11-05 DIAGNOSIS — K8020 Calculus of gallbladder without cholecystitis without obstruction: Secondary | ICD-10-CM | POA: Diagnosis not present

## 2023-11-05 NOTE — H&P (Signed)
History of Present Illness: Paula Austin is a 57 y.o. female who is seen today as an office consultation for evaluation of New Consultation ( Symptomatic cholelithiasis, )   She has been having intermittent nausea, indigestion, and right flank pain for the last several years, however her symptoms were initially very infrequent.  The last few months she has started having symptoms more frequently, now at least once a month.  She has pain that starts in the right side of her back and moves around the right flank to the front of her abdomen.  She feels that there is a pressure sensation in the right side of the abdomen.  She also has a lot of nausea and vomiting with the symptoms, as well as reflux symptoms.  She takes Pepcid which helps a little bit.  The symptoms usually occur in the evening. She had a RUQ [place] on 10/31 showing cholelithiasis and a 7mm polyp.  She was referred to discuss cholecystectomy.   Previous abdominal surgeries include a tubal ligation and a laparoscopic gastric bypass (2014).     Review of Systems: A complete review of systems was obtained from the patient.  I have reviewed this information and discussed as appropriate with the patient.  See HPI as well for other ROS.     Medical History: Past Medical History Past Medical History: Diagnosis Date  Arthritis    Hypertension    Sleep apnea    Thyroid disease         Problem List There is no problem list on file for this patient.     Past Surgical History Past Surgical History: Procedure Laterality Date  THYROIDECTOMY TOTAL   10/09/2002  LAPAROSCOPIC GASTRIC BYPASS   2014  COMBINED REDUCTION MAMMAPLASTY W/ ABDOMINOPLASTY          Allergies Allergies Allergen Reactions  Oxycodone Nausea and Anxiety     Objects moving around room; felt she was out of control and felt she was going crazy      Medications Ordered Prior to Encounter Current Outpatient Medications on File Prior to  Visit Medication Sig Dispense Refill  levothyroxine (SYNTHROID) 125 MCG tablet Take 125 mcg by mouth every morning before breakfast (0630)      lisinopriL-hydroCHLOROthiazide (ZESTORETIC) 20-25 mg tablet Take 1 tablet by mouth once daily        No current facility-administered medications on file prior to visit.      Family History Family History Problem Relation Age of Onset  Obesity Mother    High blood pressure (Hypertension) Mother    Obesity Father    Obesity Brother        Tobacco Use History Social History    Tobacco Use Smoking Status Never Smokeless Tobacco Never      Social History Social History    Socioeconomic History  Marital status: Married Tobacco Use  Smoking status: Never  Smokeless tobacco: Never Substance and Sexual Activity  Alcohol use: Not Currently  Drug use: Never      Objective:   Vitals:   11/05/23 0903 BP: 112/75 Pulse: 85 Temp: 36.1 C (97 F) SpO2: 96% Weight: (!) 137.9 kg (304 lb) Height: 170.2 cm (5\' 7" )   Body mass index is 47.61 kg/m.   Physical Exam Vitals reviewed.  Constitutional:      General: She is not in acute distress.    Appearance: Normal appearance.  HENT:     Head: Normocephalic and atraumatic.  Eyes:     General: No scleral icterus.  Conjunctiva/sclera: Conjunctivae normal.  Cardiovascular:     Rate and Rhythm: Normal rate and regular rhythm.  Pulmonary:     Effort: Pulmonary effort is normal. No respiratory distress.     Breath sounds: Normal breath sounds. No wheezing.  Abdominal:     General: There is no distension.     Palpations: Abdomen is soft.     Tenderness: There is no abdominal tenderness.     Comments: Well-healed port site surgical scars  Skin:    General: Skin is warm and dry.  Neurological:     General: No focal deficit present.     Mental Status: She is alert and oriented to person, place, and time.            Labs, Imaging and Diagnostic Testing: US abdomen  10/17/23: IMPRESSION: 1. Cholelithiasis without secondary signs of acute cholecystitis. 2. Possible 7 mm gallbladder polyp. Recommend follow-up ultrasound in 6 months. 3. Increased hepatic parenchymal echogenicity suggestive of steatosis.     Assessment and Plan:   Assessment Diagnoses and all orders for this visit:   Symptomatic cholelithiasis   Gallbladder polyp     This is a 56 yo female with cholelithiasis and a small gallbladder polyp, which is likely benign.  Some of her symptoms are typical of biliary colic, however she also has some likely reflux symptoms which I discussed would not be relieved with cholecystectomy. Laparoscopic cholecystectomy was recommended. The details of this procedure were discussed with the patient, including the risks of bleeding, infection, bile leak, post-cholecystectomy diarrhea, and <0.5% risk of common bile duct injury. The patient expressed understanding and agrees to proceed with surgery.  She will be contacted to schedule an elective surgery date.  All questions were answered.   Sophronia Simas, MD [facility], Hepatobiliary and Pancreatic Surgery 11/05/23 10:02 AM

## 2023-11-13 ENCOUNTER — Ambulatory Visit: Payer: No Typology Code available for payment source | Admitting: Family Medicine

## 2023-11-13 ENCOUNTER — Encounter: Payer: Self-pay | Admitting: Family Medicine

## 2023-11-13 VITALS — BP 126/86 | HR 74 | Temp 98.0°F | Ht 67.0 in | Wt 305.0 lb

## 2023-11-13 DIAGNOSIS — R7303 Prediabetes: Secondary | ICD-10-CM

## 2023-11-13 DIAGNOSIS — Z6841 Body Mass Index (BMI) 40.0 and over, adult: Secondary | ICD-10-CM | POA: Diagnosis not present

## 2023-11-13 DIAGNOSIS — E88819 Insulin resistance, unspecified: Secondary | ICD-10-CM | POA: Diagnosis not present

## 2023-11-13 DIAGNOSIS — E66813 Obesity, class 3: Secondary | ICD-10-CM | POA: Diagnosis not present

## 2023-11-13 DIAGNOSIS — Z9884 Bariatric surgery status: Secondary | ICD-10-CM | POA: Diagnosis not present

## 2023-11-13 DIAGNOSIS — R635 Abnormal weight gain: Secondary | ICD-10-CM

## 2023-11-13 MED ORDER — SAXENDA 18 MG/3ML ~~LOC~~ SOPN: 0.6000 mg | PEN_INJECTOR | Freq: Every day | SUBCUTANEOUS | 0 refills | Status: DC

## 2023-11-13 MED ORDER — METFORMIN HCL 500 MG PO TABS: 500.0000 mg | ORAL_TABLET | Freq: Every day | ORAL | 0 refills | Status: DC

## 2023-11-13 MED ORDER — PEN NEEDLES 32G X 4 MM MISC: 0 refills | Status: DC

## 2023-11-13 NOTE — Assessment & Plan Note (Signed)
Reviewed lab with patient.  Fasting insulin elevated at 21.4.  Explained insulin resistance to patient including fat storing side effects, increased hunger.  She has started working on increasing physical activity, reducing added sugar and starches on her prescribed dietary plan and weight reduction.  She agrees to starting metformin 500 mg once daily with breakfast for insulin resistance and prediabetes.  Reviewed potential adverse side effects.  Repeat chemistry panel, B12 and fasting insulin in the next 2 to 3 months

## 2023-11-13 NOTE — Patient Instructions (Addendum)
You can try to track daily intake on the [employer] Ap Aim for 1500-1600 calories daily This should include 110+ g of protein daily Avoid foods and drinks with over 8 g of added sugar per serving Eat 1-2 servings of fresh fruit daily, preferably before 3 pm You may increase intake of non starchy veggies and supplement protein with protein shakes, protein bars, greek yogurt, cottage cheese Limit bread, crackers, sweets esp at night  Begin metformin 500 mg once daily with food for insulin resistance Send me a message if any problems or questions We will work on coverage for [employer] may check out info on [email or web address] to start after gall bladder surgery

## 2023-11-13 NOTE — Assessment & Plan Note (Signed)
She has struggled with postop regain since gastric bypass surgery done in 2014.  Her max weight was 350 pounds.  She has some restricted food volumes at mealtime from her gastric bypass surgery but has been craving sweets and consuming sugar sweetened beverages.  Her physical activity has been minimal.  She has started working on increasing physical activity and her prescribed dietary plan.  Recommend tracking daily calories with a target goal as reviewed together on after visit summary.  Will work on prior authorization process for Saxenda at 0.6 mg once daily injection to start.  Reviewed the [email or web address] website.  She will use this along with a reduced calorie high-protein low carbohydrate low sugar diet and regular exercise.  She will wait till after her laparoscopic cholecystectomy to get started.

## 2023-11-13 NOTE — Progress Notes (Signed)
Office: [phone]  /  Fax: [phone]  WEIGHT SUMMARY AND BIOMETRICS  Starting Date: 10/31/23  Starting Weight: 298lb   Weight Lost Since Last Visit: 0lb   Vitals Temp: 98 F (36.7 C) BP: 126/86 Pulse Rate: 74 SpO2: 97 %   Body Composition  Body Fat %: 54.6 % Fat Mass (lbs): 166.8 lbs Muscle Mass (lbs): 131.8 lbs Total Body Water (lbs): 107.8 lbs Visceral Fat Rating : 20   HPI  Chief Complaint: OBESITY  Paula Austin is here to discuss her progress with her obesity treatment plan. She is on the the Category 3 Plan and states she is following her eating plan approximately 50 % of the time. She states she is exercising 30 minutes 2 times per week.   Interval History:  Since last office visit she is up 7 lb She is trying to get started on her meal plan She has cut out sweet tea and switched to a diluted green tea She is eating all 3 meals and avoids late night eating She has been doing oats and eggs in the morning She is not naturally hungry at lunch - busy at work  She keeps crackers, nuts to have around 3pm when  She was sipping on her tea throughout the day and some water She feels adequately full with dinner, not eating all the meat She is having cheese and crackers or a peanut butter sandwich at night Her husband keeps cakes at home which has been tempting since menopause  Pharmacotherapy: none  PHYSICAL EXAM:  Blood pressure 126/86, pulse 74, temperature 98 F (36.7 C), height 5\' 7"  (1.702 m), weight (!) 305 lb (138.3 kg), last menstrual period 04/29/2016, SpO2 97%. Body mass index is 47.77 kg/m.  General: She is overweight, cooperative, alert, well developed, and in no acute distress. PSYCH: Has normal mood, affect and thought process.   Lungs: Normal breathing effort, no conversational dyspnea.   ASSESSMENT AND PLAN  TREATMENT PLAN FOR OBESITY:  Recommended Dietary Goals  Paula Austin is currently in the action stage of change. As such, her goal is  to continue weight management plan. She has agreed to keeping a food journal and adhering to recommended goals of 1500-1600 calories and 110 g of  protein.  Behavioral Intervention  We discussed the following Behavioral Modification Strategies today: increasing lean protein intake to established goals, work on meal planning and preparation, work on tracking and journaling calories using tracking application, reading food labels , keeping healthy foods at home, identifying sources and decreasing liquid calories, practice mindfulness eating and understand the difference between hunger signals and cravings, planning for success, and continue to work on maintaining a reduced calorie state, getting the recommended amount of protein, incorporating whole foods, making healthy choices, staying well hydrated and practicing mindfulness when eating..  Additional resources provided today: NA  Recommended Physical Activity Goals  Paula Austin has been advised to work up to 150 minutes of moderate intensity aerobic activity a week and strengthening exercises 2-3 times per week for cardiovascular health, weight loss maintenance and preservation of muscle mass.   She has agreed to Think about enjoyable ways to increase daily physical activity and overcoming barriers to exercise and Increase physical activity in their day and reduce sedentary time (increase NEAT).  Pharmacotherapy changes for the treatment of obesity: begin metformin 500 mg once daily Work on PA process for [employer]. Do not start prior to upcoming lap chole Patient denies a personal or family history of pancreatitis, medullary thyroid carcinoma or  multiple endocrine neoplasia type II. Recommend reviewing pen training video online.   ASSOCIATED CONDITIONS ADDRESSED TODAY  Insulin resistance Assessment & Plan: Reviewed lab with patient.  Fasting insulin elevated at 21.4.  Explained insulin resistance to patient including fat storing side effects,  increased hunger.  She has started working on increasing physical activity, reducing added sugar and starches on her prescribed dietary plan and weight reduction.  She agrees to starting metformin 500 mg once daily with breakfast for insulin resistance and prediabetes.  Reviewed potential adverse side effects.  Repeat chemistry panel, B12 and fasting insulin in the next 2 to 3 months  Orders: -     metFORMIN HCl; Take 1 tablet (500 mg total) by mouth daily with breakfast.  Dispense: 30 tablet; Refill: 0  Class 3 severe obesity due to excess calories with body mass index (BMI) of 45.0 to 49.9 in adult, unspecified whether serious comorbidity present (HCC) -     Saxenda; Inject 0.6 mg into the skin daily.  Dispense: 15 mL; Refill: 0 -     Pen Needles; Use once daily as directed  Dispense: 90 each; Refill: 0  Prediabetes Assessment & Plan: Lab Results  Component Value Date   HGBA1C 6.0 (H) 10/31/2023   Reviewed lab results with patient.  She is in the prediabetic range.  She has recently reduced her intake of added sugar and starchy foods.  She has never used medication for prediabetes and denies a previous history of gestational diabetes or type 2 diabetes.  She will be starting metformin 500 mg once daily with breakfast for both insulin resistance and prediabetes while working on weight reduction.   Weight gain following gastric bypass surgery Assessment & Plan: She has struggled with postop regain since gastric bypass surgery done in 2014.  Her max weight was 350 pounds.  She has some restricted food volumes at mealtime from her gastric bypass surgery but has been craving sweets and consuming sugar sweetened beverages.  Her physical activity has been minimal.  She has started working on increasing physical activity and her prescribed dietary plan.  Recommend tracking daily calories with a target goal as reviewed together on after visit summary.  Will work on prior authorization process  for Saxenda at 0.6 mg once daily injection to start.  Reviewed the [email or web address].com website.  She will use this along with a reduced calorie high-protein low carbohydrate low sugar diet and regular exercise.  She will wait till after her laparoscopic cholecystectomy to get started.        She was informed of the importance of frequent follow up visits to maximize her success with intensive lifestyle modifications for her multiple health conditions.   ATTESTASTION STATEMENTS:  Reviewed by clinician on day of visit: allergies, medications, problem list, medical history, surgical history, family history, social history, and previous encounter notes pertinent to obesity diagnosis.   I have personally spent 47 minutes total time today in preparation, patient care, nutritional counseling and documentation for this visit, including the following: review of clinical lab tests; review of medical tests/procedures/services.      Paula Brink, DO DABFM, DABOM [facility] [address] [place], [place] [address] [phone]

## 2023-11-13 NOTE — Assessment & Plan Note (Signed)
Lab Results  Component Value Date   HGBA1C 6.0 (H) 10/31/2023   Reviewed lab results with patient.  She is in the prediabetic range.  She has recently reduced her intake of added sugar and starchy foods.  She has never used medication for prediabetes and denies a previous history of gestational diabetes or type 2 diabetes.  She will be starting metformin 500 mg once daily with breakfast for both insulin resistance and prediabetes while working on weight reduction.

## 2023-11-18 NOTE — Telephone Encounter (Signed)
PCCs:  The Split Night study was ordered on 09/12/23, the PA was started on 10/24/23.  Can someone update the patient on when she may have her sleep study scheduled?  She is trying to be considered for the [facility] procedure.  Thank you.

## 2023-12-04 NOTE — Progress Notes (Signed)
Surgical Instructions   Your procedure is scheduled on December 30, 24. Report to [facility] Main Entrance "A" at 7:00 A.M., then check in with the Admitting office. Any questions or running late day of surgery: call [phone]  Questions prior to your surgery date: call [phone], Monday-Friday, 8am-4pm. If you experience any cold or flu symptoms such as cough, fever, chills, shortness of breath, etc. between now and your scheduled surgery, please notify us at the above number.     Remember:  Do not eat after midnight the night before your surgery   You may drink clear liquids until 6:00 the morning of your surgery.   Clear liquids allowed are: Water, Non-Citrus Juices (without pulp), Carbonated Beverages, Clear Tea (no milk, honey, etc.), Black Coffee Only (NO MILK, CREAM OR POWDERED CREAMER of any kind), and Gatorade.    Take these medicines the morning of surgery with A SIP OF WATER   levothyroxine (SYNTHROID)    Do not take metFORMIN (GLUCOPHAGE) the morning of surgery.  Do NOT take Liraglutide -Weight Management (SAXENDA) for 24 hours prior to surgery.  Your last dose should be on 12/14/23.   One week prior to surgery, STOP taking any Aspirin (unless otherwise instructed by your surgeon) Aleve, Naproxen, Ibuprofen, Motrin, Advil, Goody's, BC's, all herbal medications, fish oil, and non-prescription vitamins.                     Do NOT Smoke (Tobacco/Vaping) for 24 hours prior to your procedure.  If you use a CPAP at night, you may bring your mask/headgear for your overnight stay.   You will be asked to remove any contacts, glasses, piercing's, hearing aid's, dentures/partials prior to surgery. Please bring cases for these items if needed.    Patients discharged the day of surgery will not be allowed to drive home, and someone needs to stay with them for 24 hours.  SURGICAL WAITING ROOM VISITATION Patients may have no more than 2 support people in the waiting area -  these visitors may rotate.   Pre-op nurse will coordinate an appropriate time for 1 ADULT support person, who may not rotate, to accompany patient in pre-op.  Children under the age of 63 must have an adult with them who is not the patient and must remain in the main waiting area with an adult.  If the patient needs to stay at the hospital during part of their recovery, the visitor guidelines for inpatient rooms apply.  Please refer to the [facility] website for the visitor guidelines for any additional information.   If you received a COVID test during your pre-op visit  it is requested that you wear a mask when out in public, stay away from anyone that may not be feeling well and notify your surgeon if you develop symptoms. If you have been in contact with anyone that has tested positive in the last 10 days please notify you surgeon.      Pre-operative CHG Bathing Instructions   You can play a key role in reducing the risk of infection after surgery. Your skin needs to be as free of germs as possible. You can reduce the number of germs on your skin by washing with CHG (chlorhexidine gluconate) soap before surgery. CHG is an antiseptic soap that kills germs and continues to kill germs even after washing.   DO NOT use if you have an allergy to chlorhexidine/CHG or antibacterial soaps. If your skin becomes reddened or irritated, stop  using the CHG and notify one of our RNs at [phone].              TAKE A SHOWER THE NIGHT BEFORE SURGERY AND THE DAY OF SURGERY    Please keep in mind the following:  DO NOT shave, including legs and underarms, 48 hours prior to surgery.   You may shave your face before/day of surgery.  Place clean sheets on your bed the night before surgery Use a clean washcloth (not used since being washed) for each shower. DO NOT sleep with pet's night before surgery.  CHG Shower Instructions:  Wash your face and private area with normal soap. If you choose to wash  your hair, wash first with your normal shampoo.  After you use shampoo/soap, rinse your hair and body thoroughly to remove shampoo/soap residue.  Turn the water OFF and apply half the bottle of CHG soap to a CLEAN washcloth.  Apply CHG soap ONLY FROM YOUR NECK DOWN TO YOUR TOES (washing for 3-5 minutes)  DO NOT use CHG soap on face, private areas, open wounds, or sores.  Pay special attention to the area where your surgery is being performed.  If you are having back surgery, having someone wash your back for you may be helpful. Wait 2 minutes after CHG soap is applied, then you may rinse off the CHG soap.  Pat dry with a clean towel  Put on clean pajamas    Additional instructions for the day of surgery: DO NOT APPLY any lotions, deodorants, cologne, or perfumes.   Do not wear jewelry or makeup Do not wear nail polish, gel polish, artificial nails, or any other type of covering on natural nails (fingers and toes) Do not bring valuables to the hospital. [facility] is not responsible for valuables/personal belongings. Put on clean/comfortable clothes.  Please brush your teeth.  Ask your nurse before applying any prescription medications to the skin.

## 2023-12-05 ENCOUNTER — Encounter (HOSPITAL_COMMUNITY): Payer: Self-pay

## 2023-12-05 ENCOUNTER — Other Ambulatory Visit: Payer: Self-pay

## 2023-12-05 ENCOUNTER — Encounter (HOSPITAL_COMMUNITY)
Admission: RE | Admit: 2023-12-05 | Discharge: 2023-12-05 | Disposition: A | Discharge status: Home / Self Care | Payer: No Typology Code available for payment source | Source: Ambulatory Visit | Attending: Surgery | Admitting: Surgery

## 2023-12-05 VITALS — BP 147/90 | HR 89 | Temp 98.5°F | Resp 17 | Ht 67.0 in | Wt 308.8 lb

## 2023-12-05 DIAGNOSIS — F17200 Nicotine dependence, unspecified, uncomplicated: Secondary | ICD-10-CM | POA: Insufficient documentation

## 2023-12-05 DIAGNOSIS — Z01812 Encounter for preprocedural laboratory examination: Secondary | ICD-10-CM | POA: Diagnosis not present

## 2023-12-05 DIAGNOSIS — Z9089 Acquired absence of other organs: Secondary | ICD-10-CM | POA: Diagnosis not present

## 2023-12-05 DIAGNOSIS — K8020 Calculus of gallbladder without cholecystitis without obstruction: Secondary | ICD-10-CM | POA: Diagnosis not present

## 2023-12-05 DIAGNOSIS — E785 Hyperlipidemia, unspecified: Secondary | ICD-10-CM | POA: Insufficient documentation

## 2023-12-05 DIAGNOSIS — E039 Hypothyroidism, unspecified: Secondary | ICD-10-CM | POA: Insufficient documentation

## 2023-12-05 DIAGNOSIS — R7303 Prediabetes: Secondary | ICD-10-CM | POA: Insufficient documentation

## 2023-12-05 DIAGNOSIS — G4733 Obstructive sleep apnea (adult) (pediatric): Secondary | ICD-10-CM | POA: Insufficient documentation

## 2023-12-05 DIAGNOSIS — I10 Essential (primary) hypertension: Secondary | ICD-10-CM | POA: Diagnosis not present

## 2023-12-05 DIAGNOSIS — Z01818 Encounter for other preprocedural examination: Secondary | ICD-10-CM

## 2023-12-05 DIAGNOSIS — K219 Gastro-esophageal reflux disease without esophagitis: Secondary | ICD-10-CM | POA: Diagnosis not present

## 2023-12-05 HISTORY — DX: Unspecified osteoarthritis, unspecified site: M19.90

## 2023-12-05 LAB — BASIC METABOLIC PANEL
Anion gap: 4 — ABNORMAL LOW (ref 5–15)
BUN: 9 mg/dL (ref 6–20)
CO2: 27 mmol/L (ref 22–32)
Calcium: 8.7 mg/dL — ABNORMAL LOW (ref 8.9–10.3)
Chloride: 106 mmol/L (ref 98–111)
Creatinine, Ser: 0.68 mg/dL (ref 0.44–1.00)
GFR, Estimated: >60 mL/min (ref >60)
Glucose, Bld: 98 mg/dL (ref 70–99)
Potassium: 3.6 mmol/L (ref 3.5–5.1)
Sodium: 137 mmol/L (ref 135–145)

## 2023-12-05 LAB — GLUCOSE, CAPILLARY: Glucose-Capillary: 108 mg/dL — ABNORMAL HIGH (ref 70–99)

## 2023-12-05 LAB — CBC
HCT: 38.4 % (ref 36.0–46.0)
Hemoglobin: 12.6 g/dL (ref 12.0–15.0)
MCH: 30.6 pg (ref 26.0–34.0)
MCHC: 32.8 g/dL (ref 30.0–36.0)
MCV: 93.2 fL (ref 80.0–100.0)
Platelets: 253 10*3/uL (ref 150–400)
RBC: 4.12 MIL/uL (ref 3.87–5.11)
RDW: 13.4 % (ref 11.5–15.5)
WBC: 7 10*3/uL (ref 4.0–10.5)
nRBC: 0 % (ref 0.0–0.2)

## 2023-12-05 NOTE — Progress Notes (Signed)
PCP - Dr. Ellwood Dense Cardiologist - Denies  PPM/ICD - Denies Device Orders - n/a Rep Notified - n/a  Chest x-ray - n/a EKG - 10/31/23 Stress Test - Denies ECHO - Denies Cardiac Cath - Denies  Sleep Study - Y, going for second one on Jan 17, 25 to prepare for getting the inspire CPAP - Y, don't use because settings are to high, creating gas  Fasting Blood Sugar - Denies, pt states she is pre-diabetic Checks Blood Sugar _____ times a day, Denies, does not check  Last dose of GLP1 agonist-  Denies GLP1 instructions: Denies  Blood Thinner Instructions: N/A does not take Aspirin Instructions: Denies taking  ERAS Protcol - Y PRE-SURGERY Ensure or G2- N  COVID TEST- N   Anesthesia review: Y, difficult intubation, has cpap but don't use  Patient denies shortness of breath, fever, cough and chest pain at PAT appointment. Patient denies any respiratory issues at this time.    All instructions explained to the patient, with a verbal understanding of the material. Patient agrees to go over the instructions while at home for a better understanding. Patient also instructed to self quarantine after being tested for COVID-19. The opportunity to ask questions was provided.

## 2023-12-06 NOTE — Anesthesia Preprocedure Evaluation (Addendum)
Anesthesia Evaluation  Patient identified by MRN, date of birth, ID band Patient awake    Reviewed: Allergy & Precautions, NPO status , Patient's Chart, lab work & pertinent test results  History of Anesthesia Complications (+) PONV, DIFFICULT AIRWAY and history of anesthetic complications  Airway Mallampati: III  TM Distance: >3 FB Neck ROM: Full   Comment: 2014 - Previous grade III view with MAC 4, required NPA and 2-handed BMV (intubated with video laryngoscopy and bougie, 3 attempts) Dental  (+) Dental Advisory Given   Pulmonary neg shortness of breath, sleep apnea (does not tolerate CPAP) , neg COPD, neg recent URI, Current Smoker and Patient abstained from smoking.   Pulmonary exam normal breath sounds clear to auscultation       Cardiovascular hypertension (lisinopril-HCTZ), Pt. on medications (-) angina (-) Past MI, (-) Cardiac Stents and (-) CABG (-) dysrhythmias  Rhythm:Regular Rate:Normal  HLD   Neuro/Psych negative neurological ROS     GI/Hepatic Neg liver ROS,GERD  ,,S/p gastric roux-en-Y   Endo/Other  neg diabetesHypothyroidism  Class 3 obesityPre-diabetes  Renal/GU negative Renal ROS     Musculoskeletal  (+) Arthritis ,    Abdominal  (+) + obese  Peds  Hematology negative hematology ROS (+) Lab Results      Component                Value               Date                      WBC                      7.0                 12/05/2023                HGB                      12.6                12/05/2023                HCT                      38.4                12/05/2023                MCV                      93.2                12/05/2023                PLT                      253                 12/05/2023              Anesthesia Other Findings   Reproductive/Obstetrics                              Anesthesia Physical Anesthesia Plan  ASA: 3  Anesthesia Plan:  General   Post-op Pain Management: Tylenol PO (pre-op)*  Induction: Intravenous  PONV Risk Score and Plan: 3 and Ondansetron, Propofol infusion and Treatment may vary due to age or medical condition  Airway Management Planned: Oral ETT and Video Laryngoscope Planned  Additional Equipment:   Intra-op Plan:   Post-operative Plan: Extubation in OR  Informed Consent: I have reviewed the patients History and Physical, chart, labs and discussed the procedure including the risks, benefits and alternatives for the proposed anesthesia with the patient or authorized representative who has indicated his/her understanding and acceptance.     Dental advisory given  Plan Discussed with: CRNA and Anesthesiologist  Anesthesia Plan Comments: (PAT note written 12/06/2023 by Shonna Chock, PA-C. History of difficult intubation. See note or prior anesthesia record.   Patient with h/o difficult intubation. Plan for elective glidescope intubation with succinylcholine with patient in good sniffing position. Will have fiberoptic and LMA mmediately available.  Risks of general anesthesia discussed including, but not limited to, sore throat, hoarse voice, chipped/damaged teeth, injury to vocal cords, nausea and vomiting, allergic reactions, lung infection, heart attack, stroke, and death. All questions answered. )        Anesthesia Quick Evaluation

## 2023-12-06 NOTE — Progress Notes (Signed)
Anesthesia Chart Review:  Case: [id number] Date/Time: 12/16/23 0845   Procedure: LAPAROSCOPIC CHOLECYSTECTOMY   Anesthesia type: General   Pre-op diagnosis: GALLSTONES   [facility] ROOM 02 / [facility] OR   Surgeons: Fritzi Mandes, MD       DISCUSSION: Patient is a 57 year old female scheduled for the above procedure.  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   She reported difficulty with CPAP due to high pressures and is preparing for a repeat sleep study and potential evaluation for Inspire device.   In regards to DIFFICULT INTUBATION history per 09/07/13 Anesthesia Record: Preoxygenation: Pre-oxygenation with 100% oxygen (glidescope, bougee at bedside elective glidescope per Dr Okey Dupre) Intubation Type: IV induction Ventilation: Mask ventilation throughout procedure, Two handed mask ventilation required and Nasal airway inserted- appropriate to patient size Laryngoscope Size: Mac and 4 Grade View: Grade III Tube type: Glide rite Tube size: 7.5 mm Number of attempts: 3 Airway Equipment and Method: Video-laryngoscopy and Bougie stylet (elective ) Dental Injury: Bloody posterior oropharynx  Difficulty Due To: Difficulty was anticipated, Difficult Airway- due to anterior larynx, Difficult Airway- due to limited oral opening, Difficult Airway- due to dentition, Difficult Airway- due to reduced neck mobility and Difficult Airway- due to large tongue Future Recommendations: Recommend- induction with short-acting agent, and alternative techniques readily available   She is followed at [facility]. She was prescribed Saxenda daily injections, but advised by Dr. Seymour Bars not to start prior to cholecystectomy. She was started on metformin 500 mg daily for insulin resistance/pre-diabetes.  A1c 6.0% on 10/31/23. TSH 1.310 on 10/11/23.  Anesthesia team to evaluate on the day of surgery.    VS: BP (!) 147/90   Pulse 89   Temp 36.9 C   Resp 17   Ht 5\' 7"  (1.702 m)   Wt (!) 140.1 kg   LMP 04/29/2016   SpO2 99%   BMI 48.36 kg/m   PROVIDERS: Caro Laroche, DO is PCP    LABS: Labs reviewed: Acceptable for surgery. LFTs normal 07/29/23.  (all labs ordered are listed, but only abnormal results are displayed)  Labs Reviewed  GLUCOSE, CAPILLARY - Abnormal; Notable for the following components:      Result Value   Glucose-Capillary 108 (*)    All other components within normal limits  BASIC METABOLIC PANEL - Abnormal; Notable for the following components:   Calcium 8.7 (*)    Anion gap 4 (*)    All other components within normal limits  CBC    IMAGES: [place] Abd (limited) 10/17/23: IMPRESSION: 1. Cholelithiasis without secondary signs of acute cholecystitis. 2. Possible 7 mm gallbladder polyp. Recommend follow-up ultrasound in 6 months. 3. Increased hepatic parenchymal echogenicity suggestive of steatosis.    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   "had thick neck" , trouble with intubatuion sept sept 2014 also   Family history of diabetes mellitus 01/17/2023   Gallbladder disease    GERD (gastroesophageal reflux disease)    occasional, none lately   Goiter    removed in 1999   History of kidney stones    Hyperlipidemia    Hypertension    Hypothyroidism    Joint pain    Morbid obesity (HCC)  OSA on CPAP    uses cpap some nights, pt does know settings   PONV (postoperative nausea and vomiting) 08/17/2013   lots of coughing up phelgm   Pre-diabetes    Sleep apnea    cpap machine   Thyroid disease     Past Surgical History:  Procedure Laterality Date   BREAST SURGERY Bilateral 11/15/06   Reduction   BREATH TEK H PYLORI N/A 05/26/2013   Procedure:  BREATH TEK H PYLORI;  Surgeon: Valarie Merino, MD;  Location: [facility];  Service: General;  Laterality: N/A;   COLONOSCOPY WITH PROPOFOL N/A 06/17/2014   Procedure: COLONOSCOPY WITH PROPOFOL;  Surgeon: Rachael Fee, MD;  Location: [facility];  Service: Endoscopy;  Laterality: N/A;   GASTRIC ROUX-EN-Y N/A 09/07/2013   Procedure: LAPAROSCOPIC ROUX-EN-Y GASTRIC upper endoscopy;  Surgeon: Valarie Merino, MD;  Location: [facility];  Service: General;  Laterality: N/A;   THYROIDECTOMY  10/30/02   TUBAL LIGATION  08/25/98   had hard time getting ET tube down per pt.     MEDICATIONS:  cyanocobalamin 1000 MCG tablet   Insulin Pen Needle (PEN NEEDLES) 32G X 4 MM MISC   levothyroxine (SYNTHROID) 150 MCG tablet   Liraglutide -Weight Management (SAXENDA) 18 MG/3ML SOPN   lisinopril-hydrochlorothiazide (ZESTORETIC) 20-25 MG tablet   metFORMIN (GLUCOPHAGE) 500 MG tablet   predniSONE (DELTASONE) 50 MG tablet   No current facility-administered medications for this encounter.   Prednisone for 5 days was prescribed back on 10/02/23 for left knee pain.   Shonna Chock, PA-C Surgical Short Stay/Anesthesiology [facility] Phone [phone] [facility] Phone [phone] 12/06/2023 2:28 PM

## 2023-12-12 ENCOUNTER — Ambulatory Visit: Payer: No Typology Code available for payment source | Admitting: Family Medicine

## 2023-12-12 ENCOUNTER — Other Ambulatory Visit: Payer: Self-pay | Admitting: Family Medicine

## 2023-12-12 DIAGNOSIS — E88819 Insulin resistance, unspecified: Secondary | ICD-10-CM

## 2023-12-16 ENCOUNTER — Encounter (HOSPITAL_COMMUNITY)
Admission: RE | Disposition: A | Discharge status: Home / Self Care | Payer: Self-pay | Source: Home / Self Care | Attending: Surgery

## 2023-12-16 ENCOUNTER — Encounter (HOSPITAL_COMMUNITY): Payer: Self-pay | Admitting: Surgery

## 2023-12-16 ENCOUNTER — Ambulatory Visit (HOSPITAL_COMMUNITY)
Admission: RE | Admit: 2023-12-16 | Discharge: 2023-12-16 | Disposition: A | Discharge status: Home / Self Care | Payer: 59 | Attending: Surgery | Admitting: Surgery

## 2023-12-16 ENCOUNTER — Ambulatory Visit (HOSPITAL_BASED_OUTPATIENT_CLINIC_OR_DEPARTMENT_OTHER): Payer: 59 | Admitting: Anesthesiology

## 2023-12-16 ENCOUNTER — Ambulatory Visit (HOSPITAL_COMMUNITY): Payer: 59 | Admitting: Vascular Surgery

## 2023-12-16 ENCOUNTER — Other Ambulatory Visit: Payer: Self-pay

## 2023-12-16 DIAGNOSIS — R7303 Prediabetes: Secondary | ICD-10-CM | POA: Diagnosis not present

## 2023-12-16 DIAGNOSIS — I10 Essential (primary) hypertension: Secondary | ICD-10-CM

## 2023-12-16 DIAGNOSIS — F17290 Nicotine dependence, other tobacco product, uncomplicated: Secondary | ICD-10-CM | POA: Insufficient documentation

## 2023-12-16 DIAGNOSIS — Z8249 Family history of ischemic heart disease and other diseases of the circulatory system: Secondary | ICD-10-CM | POA: Diagnosis not present

## 2023-12-16 DIAGNOSIS — K8010 Calculus of gallbladder with chronic cholecystitis without obstruction: Secondary | ICD-10-CM | POA: Insufficient documentation

## 2023-12-16 DIAGNOSIS — K8020 Calculus of gallbladder without cholecystitis without obstruction: Secondary | ICD-10-CM

## 2023-12-16 DIAGNOSIS — F17200 Nicotine dependence, unspecified, uncomplicated: Secondary | ICD-10-CM | POA: Diagnosis not present

## 2023-12-16 DIAGNOSIS — E785 Hyperlipidemia, unspecified: Secondary | ICD-10-CM | POA: Diagnosis not present

## 2023-12-16 DIAGNOSIS — Z9884 Bariatric surgery status: Secondary | ICD-10-CM | POA: Diagnosis not present

## 2023-12-16 DIAGNOSIS — Z9851 Tubal ligation status: Secondary | ICD-10-CM | POA: Insufficient documentation

## 2023-12-16 DIAGNOSIS — Z01818 Encounter for other preprocedural examination: Secondary | ICD-10-CM

## 2023-12-16 HISTORY — PX: CHOLECYSTECTOMY: SHX55

## 2023-12-16 SURGERY — LAPAROSCOPIC CHOLECYSTECTOMY
Anesthesia: General | Site: Abdomen

## 2023-12-16 MED ORDER — LIDOCAINE 2% (20 MG/ML) 5 ML SYRINGE
INTRAMUSCULAR | Status: DC | PRN
  Administered 2023-12-16: 50 mg via INTRAVENOUS

## 2023-12-16 MED ORDER — FENTANYL CITRATE (PF) 250 MCG/5ML IJ SOLN
INTRAMUSCULAR | Status: AC
  Filled 2023-12-16: qty 5

## 2023-12-16 MED ORDER — HYDROCODONE-ACETAMINOPHEN 5-325 MG PO TABS: 1.0000 | ORAL_TABLET | Freq: Four times a day (QID) | ORAL | 0 refills | Status: AC | PRN

## 2023-12-16 MED ORDER — BUPIVACAINE-EPINEPHRINE (PF) 0.25% -1:200000 IJ SOLN
INTRAMUSCULAR | Status: AC
  Filled 2023-12-16: qty 30

## 2023-12-16 MED ORDER — ONDANSETRON HCL 4 MG/2ML IJ SOLN
INTRAMUSCULAR | Status: DC | PRN
  Administered 2023-12-16: 4 mg via INTRAVENOUS

## 2023-12-16 MED ORDER — ORAL CARE MOUTH RINSE: 15.0000 mL | Freq: Once | OROMUCOSAL | Status: AC

## 2023-12-16 MED ORDER — HYDROMORPHONE HCL 1 MG/ML IJ SOLN
INTRAMUSCULAR | Status: DC | PRN
  Administered 2023-12-16: .5 mg via INTRAVENOUS

## 2023-12-16 MED ORDER — ACETAMINOPHEN 500 MG PO TABS
1000.0000 mg | ORAL_TABLET | ORAL | Status: AC
  Administered 2023-12-16: 1000 mg via ORAL
  Filled 2023-12-16: qty 2

## 2023-12-16 MED ORDER — AMISULPRIDE (ANTIEMETIC) 5 MG/2ML IV SOLN: 10.0000 mg | Freq: Once | INTRAVENOUS | Status: DC | PRN

## 2023-12-16 MED ORDER — SODIUM CHLORIDE 0.9 % IV SOLN
3.0000 g | INTRAVENOUS | Status: AC
  Administered 2023-12-16: 3 g via INTRAVENOUS
  Filled 2023-12-16: qty 3

## 2023-12-16 MED ORDER — FENTANYL CITRATE (PF) 250 MCG/5ML IJ SOLN
INTRAMUSCULAR | Status: DC | PRN
  Administered 2023-12-16 (×2): 100 ug via INTRAVENOUS
  Administered 2023-12-16: 50 ug via INTRAVENOUS

## 2023-12-16 MED ORDER — PROPOFOL 500 MG/50ML IV EMUL
INTRAVENOUS | Status: DC | PRN
  Administered 2023-12-16: 125 ug/kg/min via INTRAVENOUS

## 2023-12-16 MED ORDER — CHLORHEXIDINE GLUCONATE 0.12 % MT SOLN
15.0000 mL | Freq: Once | OROMUCOSAL | Status: AC
  Administered 2023-12-16: 15 mL via OROMUCOSAL
  Filled 2023-12-16: qty 15

## 2023-12-16 MED ORDER — PROPOFOL 10 MG/ML IV BOLUS
INTRAVENOUS | Status: AC
  Filled 2023-12-16: qty 20

## 2023-12-16 MED ORDER — DEXAMETHASONE SODIUM PHOSPHATE 10 MG/ML IJ SOLN
INTRAMUSCULAR | Status: DC | PRN
  Administered 2023-12-16: 10 mg via INTRAVENOUS

## 2023-12-16 MED ORDER — LACTATED RINGERS IV SOLN: INTRAVENOUS | Status: DC

## 2023-12-16 MED ORDER — FENTANYL CITRATE (PF) 100 MCG/2ML IJ SOLN: 25.0000 ug | INTRAMUSCULAR | Status: DC | PRN

## 2023-12-16 MED ORDER — HYDROMORPHONE HCL 1 MG/ML IJ SOLN
INTRAMUSCULAR | Status: AC
  Filled 2023-12-16: qty 0.5

## 2023-12-16 MED ORDER — SUGAMMADEX SODIUM 200 MG/2ML IV SOLN
INTRAVENOUS | Status: DC | PRN
  Administered 2023-12-16: 200 mg via INTRAVENOUS

## 2023-12-16 MED ORDER — ROCURONIUM BROMIDE 10 MG/ML (PF) SYRINGE
PREFILLED_SYRINGE | INTRAVENOUS | Status: DC | PRN
  Administered 2023-12-16: 50 mg via INTRAVENOUS

## 2023-12-16 MED ORDER — 0.9 % SODIUM CHLORIDE (POUR BTL) OPTIME
TOPICAL | Status: DC | PRN
  Administered 2023-12-16: 1000 mL

## 2023-12-16 MED ORDER — BUPIVACAINE-EPINEPHRINE 0.25% -1:200000 IJ SOLN
INTRAMUSCULAR | Status: DC | PRN
  Administered 2023-12-16: 30 mL

## 2023-12-16 MED ORDER — PROPOFOL 10 MG/ML IV BOLUS
INTRAVENOUS | Status: DC | PRN
  Administered 2023-12-16: 200 mg via INTRAVENOUS

## 2023-12-16 MED ORDER — SODIUM CHLORIDE 0.9 % IR SOLN
Status: DC | PRN
  Administered 2023-12-16: 1000 mL

## 2023-12-16 MED ORDER — CELECOXIB 200 MG PO CAPS
200.0000 mg | ORAL_CAPSULE | ORAL | Status: AC
  Administered 2023-12-16: 200 mg via ORAL
  Filled 2023-12-16: qty 1

## 2023-12-16 SURGICAL SUPPLY — 40 items
APPLIER CLIP 5 13 M/L LIGAMAX5 (MISCELLANEOUS) ×1
BAG COUNTER SPONGE SURGICOUNT (BAG) ×2 IMPLANT
BLADE CLIPPER SURG (BLADE) IMPLANT
CANISTER SUCT 3000ML PPV (MISCELLANEOUS) ×2 IMPLANT
CHLORAPREP W/TINT 26 (MISCELLANEOUS) ×2 IMPLANT
CLIP APPLIE 5 13 M/L LIGAMAX5 (MISCELLANEOUS) ×2 IMPLANT
COVER SURGICAL LIGHT HANDLE (MISCELLANEOUS) ×2 IMPLANT
DERMABOND ADVANCED .7 DNX12 (GAUZE/BANDAGES/DRESSINGS) ×2 IMPLANT
ELECT REM PT RETURN 9FT ADLT (ELECTROSURGICAL) ×1
ELECTRODE REM PT RTRN 9FT ADLT (ELECTROSURGICAL) ×2 IMPLANT
GLOVE BIOGEL PI IND STRL 6 (GLOVE) ×2 IMPLANT
GLOVE BIOGEL PI MICRO STRL 5.5 (GLOVE) ×2 IMPLANT
GOWN STRL REUS W/ TWL LRG LVL3 (GOWN DISPOSABLE) ×6 IMPLANT
GRASPER SUT TROCAR 14GX15 (MISCELLANEOUS) IMPLANT
IRRIG SUCT STRYKERFLOW 2 WTIP (MISCELLANEOUS) ×1
IRRIGATION SUCT STRKRFLW 2 WTP (MISCELLANEOUS) ×2 IMPLANT
KIT BASIN OR (CUSTOM PROCEDURE TRAY) ×2 IMPLANT
KIT TURNOVER KIT B (KITS) ×2 IMPLANT
L-HOOK LAP DISP 36CM (ELECTROSURGICAL) ×1
LHOOK LAP DISP 36CM (ELECTROSURGICAL) ×2 IMPLANT
NDL INSUFFLATION 14GA 120MM (NEEDLE) IMPLANT
NEEDLE INSUFFLATION 14GA 120MM (NEEDLE)
NS IRRIG 1000ML POUR BTL (IV SOLUTION) ×2 IMPLANT
PAD ARMBOARD 7.5X6 YLW CONV (MISCELLANEOUS) ×2 IMPLANT
PENCIL BUTTON HOLSTER BLD 10FT (ELECTRODE) ×2 IMPLANT
POUCH RETRIEVAL ECOSAC 10 (ENDOMECHANICALS) IMPLANT
SCISSORS LAP 5X35 DISP (ENDOMECHANICALS) ×2 IMPLANT
SET TUBE SMOKE EVAC HIGH FLOW (TUBING) ×2 IMPLANT
SLEEVE Z-THREAD 5X100MM (TROCAR) ×4 IMPLANT
SUT MNCRL AB 4-0 PS2 18 (SUTURE) ×2 IMPLANT
SYS BAG RETRIEVAL 10MM (BASKET)
SYSTEM BAG RETRIEVAL 10MM (BASKET) IMPLANT
TOWEL GREEN STERILE (TOWEL DISPOSABLE) ×2 IMPLANT
TOWEL GREEN STERILE FF (TOWEL DISPOSABLE) ×2 IMPLANT
TRAY LAPAROSCOPIC MC (CUSTOM PROCEDURE TRAY) ×2 IMPLANT
TROCAR BALLN 12MMX100 BLUNT (TROCAR) ×2 IMPLANT
TROCAR Z THREAD OPTICAL 12X100 (TROCAR) IMPLANT
TROCAR Z-THREAD OPTICAL 5X100M (TROCAR) ×2 IMPLANT
WARMER LAPAROSCOPE (MISCELLANEOUS) ×2 IMPLANT
WATER STERILE IRR 1000ML POUR (IV SOLUTION) ×2 IMPLANT

## 2023-12-16 NOTE — Anesthesia Postprocedure Evaluation (Signed)
Anesthesia Post Note  Patient: Paula Austin  Procedure(s) Performed: LAPAROSCOPIC CHOLECYSTECTOMY (Abdomen)     Patient location during evaluation: PACU Anesthesia Type: General Level of consciousness: awake Pain management: pain level controlled Vital Signs Assessment: post-procedure vital signs reviewed and stable Respiratory status: spontaneous breathing, nonlabored ventilation and respiratory function stable Cardiovascular status: blood pressure returned to baseline and stable Postop Assessment: no apparent nausea or vomiting Anesthetic complications: no   No notable events documented.  Last Vitals:  Vitals:   12/16/23 1100 12/16/23 1115  BP: 125/86 121/77  Pulse: 76 76  Resp: 20 (!) 22  Temp:  (!) 36.3 C  SpO2: 95% 98%    Last Pain:  Vitals:   12/16/23 1115  TempSrc:   PainSc: 0-No pain                 Linton Rump

## 2023-12-16 NOTE — H&P (Signed)
Paula Austin Mar 31, 1966  [id number].     HPI:  Paula Austin is a 57 yo female who was referred with RUQ pain and cholelithiasis. She has been having intermittent nausea, indigestion, and right flank pain for the last several years, however her symptoms were initially very infrequent. The last few months she has started having symptoms more frequently, now at least once a month. She has pain that starts in the right side of her back and moves around the right flank to the front of her abdomen. She feels that there is a pressure sensation in the right side of the abdomen. She also has a lot of nausea and vomiting with the symptoms, as well as reflux symptoms. She takes Pepcid which helps a little bit. The symptoms usually occur in the evening. She had a RUQ [place] on 10/31 showing cholelithiasis and a 7mm polyp. She was referred to discuss cholecystectomy and presents today for surgery.  Previous abdominal surgeries include a tubal ligation and a laparoscopic gastric bypass (2014).   ROS: ROS  Family History  Problem Relation Age of Onset   Thyroid disease Mother    Hypertension Mother    Hyperlipidemia Mother    Obesity Mother    Hyperlipidemia Father    Diabetes Father    Sleep apnea Father    Cancer Maternal Grandfather        lung   Diabetes Paternal Grandmother    Colon cancer Neg Hx     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   "had thick neck" , trouble with intubatuion sept sept 2014 also   Family history of diabetes mellitus 01/17/2023   Gallbladder disease    GERD (gastroesophageal reflux disease)    occasional, none lately   Goiter    removed in 1999   History of kidney stones    Hyperlipidemia    Hypertension    Hypothyroidism    Joint pain    Morbid obesity (HCC)    OSA on CPAP    uses cpap some nights, pt does know settings   PONV (postoperative nausea and vomiting) 08/17/2013   lots of coughing up  phelgm   Pre-diabetes    Sleep apnea    cpap machine   Thyroid disease     Past Surgical History:  Procedure Laterality Date   BREAST SURGERY Bilateral 11/15/06   Reduction   BREATH TEK H PYLORI N/A 05/26/2013   Procedure: BREATH TEK H PYLORI;  Surgeon: Valarie Merino, MD;  Location: [facility];  Service: General;  Laterality: N/A;   COLONOSCOPY WITH PROPOFOL N/A 06/17/2014   Procedure: COLONOSCOPY WITH PROPOFOL;  Surgeon: Rachael Fee, MD;  Location: [facility];  Service: Endoscopy;  Laterality: N/A;   GASTRIC ROUX-EN-Y N/A 09/07/2013   Procedure: LAPAROSCOPIC ROUX-EN-Y GASTRIC upper endoscopy;  Surgeon: Valarie Merino, MD;  Location: [facility];  Service: General;  Laterality: N/A;   THYROIDECTOMY  10/30/02   TUBAL LIGATION  08/25/98   had hard time getting ET tube down per pt.     Social History:  reports that she has been smoking cigars. She has never used smokeless tobacco. She reports current alcohol use. She reports that she does not use drugs.  Allergies:  Allergies  Allergen Reactions   Oxycodone Anxiety    Objects moving around room; felt she was out of control and felt she was going crazy    Medications Prior  to Admission  Medication Sig Dispense Refill   cyanocobalamin 1000 MCG tablet Take 1,000 mcg by mouth daily.      levothyroxine (SYNTHROID) 150 MCG tablet Take 1 tablet (150 mcg total) by mouth daily before breakfast. 90 tablet 0   lisinopril-hydrochlorothiazide (ZESTORETIC) 20-25 MG tablet Take 1 tablet by mouth daily. 90 tablet 3   Insulin Pen Needle (PEN NEEDLES) 32G X 4 MM MISC Use once daily as directed 90 each 0   Liraglutide -Weight Management (SAXENDA) 18 MG/3ML SOPN Inject 0.6 mg into the skin daily. (Patient not taking: Reported on 12/05/2023) 15 mL 0   metFORMIN (GLUCOPHAGE) 500 MG tablet Take 1 tablet (500 mg total) by mouth daily with breakfast. 30 tablet 0   predniSONE (DELTASONE) 50 MG tablet Take one tablet daily for 5 days. 5 tablet 0      Physical Exam: Blood pressure (!) 129/93, pulse 95, temperature 98.1 F (36.7 C), temperature source Oral, resp. rate 18, height 5\' 7"  (1.702 m), weight 135.6 kg, last menstrual period 04/29/2016, SpO2 96%. General: resting comfortably, NAD Neuro: alert and oriented, no focal deficits Resp: normal work of breathing on room air CV: RRR Abdomen: soft, nondistended. Extremities: warm and well-perfused   No results found for this or any previous visit (from the past 48 hours). No results found.    Assessment/Plan 57 yo female with symptomatic cholelithiasis. Proceed to OR for laparoscopic cholecystectomy. Procedure details were reviewed. Plan for discharge home postoperatively.   Paula Simas, MD [facility] General, Hepatobiliary and Pancreatic Surgery 12/16/23 9:03 AM

## 2023-12-16 NOTE — Transfer of Care (Signed)
Immediate Anesthesia Transfer of Care Note  Patient: Paula Austin  Procedure(s) Performed: LAPAROSCOPIC CHOLECYSTECTOMY (Abdomen)  Patient Location: PACU  Anesthesia Type:General  Level of Consciousness: awake, alert , and oriented  Airway & Oxygen Therapy: Patient Spontanous Breathing  Post-op Assessment: Report given to RN and Post -op Vital signs reviewed and stable  Post vital signs: Reviewed and stable  Last Vitals:  Vitals Value Taken Time  BP 133/73 12/16/23 1042  Temp 36.1 C 12/16/23 1042  Pulse 73 12/16/23 1043  Resp 8 12/16/23 1043  SpO2 97 % 12/16/23 1043  Vitals shown include unfiled device data.  Last Pain:  Vitals:   12/16/23 0736  TempSrc:   PainSc: 0-No pain      Patients Stated Pain Goal: 0 (12/16/23 0736)  Complications: No notable events documented.

## 2023-12-16 NOTE — Op Note (Signed)
Date: 12/16/23  Patient: Paula Austin MRN: [id number]  Preoperative Diagnosis: Symptomatic cholelithiasis Postoperative Diagnosis: Same  Procedure: Laparoscopic cholecystectomy  Surgeon: Sophronia Simas, MD Assistant: Jeronimo Greaves, RNFA  EBL: Minimal  Anesthesia: General endotracheal  Specimens: Gallbladder  Indications: [name] is a 57 yo female who was referred with RUQ abdominal pain. RUQ US showed gallstones, and symptoms were consistent with biliary colic. After a discussion of the risks and benefits of surgery, she agreed to proceed with cholecystectomy.  Findings: No evidence of acute cholecystitis.  Procedure details: Informed consent was obtained in the preoperative area prior to the procedure. The patient was brought to the operating room and placed on the table in the supine position. General anesthesia was induced and appropriate lines and drains were placed for intraoperative monitoring. Perioperative antibiotics were administered per SCIP guidelines. The abdomen was prepped and draped in the usual sterile fashion. A pre-procedure timeout was taken verifying patient identity, surgical site and procedure to be performed.  A small supraumbilical skin incision was made, the subcutaneous tissue was divided with cautery, and the umbilical stalk was grasped and elevated. A Veress needle was inserted through the fascia, intraperitoneal placement was confirmed with the saline drop test, and the abdomen was insufflated. An 11mm trocar was placed and the peritoneal cavity was inspected with no evidence of visceral or vascular injury. Three 5mm ports were placed in the right subcostal margin, all under direct visualization. The fundus of the gallbladder was grasped and retracted cephalad. There were some omental adhesions to the gallbladder liver capsule, which were taken down with cautery. The infundibulum of the gallbladder was retracted laterally and the peritoneum overlying the  gallbladder was incised with cautery. The cystic triangle was dissected out using cautery and blunt dissection, and the critical view of safety was obtained. The cystic duct and cystic artery were clipped and divided, leaving two clips behind on the cystic duct stump. The gallbladder was taken off the liver using cautery, and the specimen was placed in an Ecosac and extracted via the umbilical port site. The surgical site was irrigated with saline until the effluent was clear. Hemostasis was achieved in the gallbladder fossa using cautery. The cystic duct and artery stumps were visually inspected and there was no evidence of bile leak or bleeding. The fascia at the 11mm port site was closed with a 0 Vicryl figure-of-eight suture using a PMI. The remaining ports were removed and the abdomen was desufflated. The specimen was extracted via the umbilical port site. The skin at all port sites was closed with 4-0 monocryl subcuticular suture. Dermabond was applied.  The patient tolerated the procedure well with no apparent complications. All counts were correct x2 at the end of the procedure. The patient was extubated and taken to PACU in stable condition.  Sophronia Simas, MD 12/16/23 10:32 AM

## 2023-12-16 NOTE — Discharge Instructions (Addendum)
[facility] DISCHARGE INSTRUCTIONS  Activity No heavy lifting greater than 15 pounds for 4 weeks after surgery. Ok to shower in 24 hours, but do not bathe or submerge incisions underwater. Do not drive while taking narcotic pain medication. You may drive when you are no longer taking prescription pain medication, you can comfortably wear a seatbelt, and you can safely maneuver your car and apply brakes.  Wound Care Your incisions are covered with skin glue called Dermabond. This will peel off on its own over time. You may shower and allow warm soapy water to run over your incisions. Gently pat dry. Do not submerge your incision underwater until cleared by your surgeon. Monitor your incision for any new redness, tenderness, or drainage. Many patients will experience some swelling and bruising at the incisions.  Ice packs will help.  Swelling and bruising can take several days to resolve.   Medications A  prescription for pain medication may be given to you upon discharge.  Take your pain medication as prescribed, if needed.  If narcotic pain medicine is not needed, then you may take acetaminophen (Tylenol) or ibuprofen (Advil) as needed. It is common to experience some constipation if taking pain medication after surgery.  Increasing fluid intake and taking a stool softener (such as Colace) will usually help or prevent this problem from occurring.  A mild laxative (Milk of Magnesia or Miralax) should be taken according to package directions if there are no bowel movements after 48 hours. Take your usually prescribed medications unless otherwise directed. If you need a refill on your pain medication, please contact your pharmacy.  They will contact our office to request authorization. Prescriptions will not be filled after 5 pm or on weekends.  When to Call us: Fever greater than 100.5 New redness, drainage, or swelling at incision site Severe pain, nausea, or  vomiting Persistent bleeding from incisions Jaundice (yellowing of the whites of the eyes or skin)  Follow-up You have an appointment scheduled with Dr. Freida Busman on January 08, 2024 at 9:30am. This will be at the [facility] Surgery office at [address], [place], [place]. Please arrive at least 15 minutes prior to your scheduled appointment time.  IF YOU HAVE DISABILITY OR FAMILY LEAVE FORMS, YOU MUST BRING THEM TO THE OFFICE FOR PROCESSING.   DO NOT GIVE THEM TO YOUR DOCTOR.  The clinic staff is available to answer your questions during regular business hours.  Please don't hesitate to call and ask to speak to one of the nurses for clinical concerns.  If you have a medical emergency, go to the nearest emergency room or call 911.  A surgeon from [facility] is always on call at the hospital  [address], [place], [place]  [address] ?  [address] [address], [place], [place]   [address] [phone] ? Toll Free: [phone] ? FAX [phone] Web site: [email or web address]      Managing Your Pain After Surgery Without Opioids    Thank you for participating in our program to help patients manage their pain after surgery without opioids. This is part of our effort to provide you with the best care possible, without exposing you or your family to the risk that opioids pose.  What pain can I expect after surgery? You can expect to have some pain after surgery. This is normal. The pain is typically worse the day after surgery, and quickly begins to get better. Many studies have  found that many patients are able to manage their pain after surgery with Over-the-Counter (OTC) medications such as Tylenol and Motrin. If you have a condition that does not allow you to take Tylenol or Motrin, notify your surgical team.  How will I manage my pain? The best strategy for controlling your pain after surgery is around the clock pain control with  Tylenol (acetaminophen) and Motrin (ibuprofen or Advil). Alternating these medications with each other allows you to maximize your pain control. In addition to Tylenol and Motrin, you can use heating pads or ice packs on your incisions to help reduce your pain.  How will I alternate your regular strength over-the-counter pain medication? You will take a dose of pain medication every three hours. Start by taking 650 mg of Tylenol (2 pills of 325 mg) 3 hours later take 600 mg of Motrin (3 pills of 200 mg) 3 hours after taking the Motrin take 650 mg of Tylenol 3 hours after that take 600 mg of Motrin.   - 1 -  See example - if your first dose of Tylenol is at 12:00 PM   12:00 PM Tylenol 650 mg (2 pills of 325 mg)  3:00 PM Motrin 600 mg (3 pills of 200 mg)  6:00 PM Tylenol 650 mg (2 pills of 325 mg)  9:00 PM Motrin 600 mg (3 pills of 200 mg)  Continue alternating every 3 hours   We recommend that you follow this schedule around-the-clock for at least 3 days after surgery, or until you feel that it is no longer needed. Use the table on the last page of this handout to keep track of the medications you are taking. Important: Do not take more than 3000mg  of Tylenol or 3200mg  of Motrin in a 24-hour period. Do not take ibuprofen/Motrin if you have a history of bleeding stomach ulcers, severe kidney disease, &/or actively taking a blood thinner  What if I still have pain? If you have pain that is not controlled with the over-the-counter pain medications (Tylenol and Motrin or Advil) you might have what we call "breakthrough" pain. You will receive a prescription for a small amount of an opioid pain medication such as Oxycodone, Tramadol, or Tylenol with Codeine. Use these opioid pills in the first 24 hours after surgery if you have breakthrough pain. Do not take more than 1 pill every 4-6 hours.  If you still have uncontrolled pain after using all opioid pills, don't hesitate to call our staff  using the number provided. We will help make sure you are managing your pain in the best way possible, and if necessary, we can provide a prescription for additional pain medication.   Day 1    Time  Name of Medication Number of pills taken  Amount of Acetaminophen  Pain Level   Comments  AM PM       AM PM       AM PM       AM PM       AM PM       AM PM       AM PM       AM PM       Total Daily amount of Acetaminophen Do not take more than  3,000 mg per day      Day 2    Time  Name of Medication Number of pills taken  Amount of Acetaminophen  Pain Level   Comments  AM PM  AM PM       AM PM       AM PM       AM PM       AM PM       AM PM       AM PM       Total Daily amount of Acetaminophen Do not take more than  3,000 mg per day      Day 3    Time  Name of Medication Number of pills taken  Amount of Acetaminophen  Pain Level   Comments  AM PM       AM PM       AM PM       AM PM         AM PM       AM PM       AM PM       AM PM       Total Daily amount of Acetaminophen Do not take more than  3,000 mg per day      Day 4    Time  Name of Medication Number of pills taken  Amount of Acetaminophen  Pain Level   Comments  AM PM       AM PM       AM PM       AM PM       AM PM       AM PM       AM PM       AM PM       Total Daily amount of Acetaminophen Do not take more than  3,000 mg per day      Day 5    Time  Name of Medication Number of pills taken  Amount of Acetaminophen  Pain Level   Comments  AM PM       AM PM       AM PM       AM PM       AM PM       AM PM       AM PM       AM PM       Total Daily amount of Acetaminophen Do not take more than  3,000 mg per day      Day 6    Time  Name of Medication Number of pills taken  Amount of Acetaminophen  Pain Level  Comments  AM PM       AM PM       AM PM       AM PM       AM PM       AM PM       AM PM       AM PM       Total Daily amount of  Acetaminophen Do not take more than  3,000 mg per day      Day 7    Time  Name of Medication Number of pills taken  Amount of Acetaminophen  Pain Level   Comments  AM PM       AM PM       AM PM       AM PM       AM PM       AM PM       AM PM       AM PM  Total Daily amount of Acetaminophen Do not take more than  3,000 mg per day        For additional information about how and where to safely dispose of unused opioid medications - [email or web address]  Disclaimer: This document contains information and/or instructional materials adapted from [facility] for the typical patient with your condition. It does not replace medical advice from your health care provider because your experience may differ from that of the typical patient. Talk to your health care provider if you have any questions about this document, your condition or your treatment plan. Adapted from [facility]

## 2023-12-17 ENCOUNTER — Encounter (HOSPITAL_COMMUNITY): Payer: Self-pay | Admitting: Surgery

## 2023-12-17 ENCOUNTER — Other Ambulatory Visit: Payer: Self-pay | Admitting: Family Medicine

## 2023-12-17 DIAGNOSIS — E88819 Insulin resistance, unspecified: Secondary | ICD-10-CM

## 2023-12-17 LAB — SURGICAL PATHOLOGY

## 2023-12-19 DIAGNOSIS — R051 Acute cough: Secondary | ICD-10-CM | POA: Diagnosis not present

## 2023-12-19 DIAGNOSIS — J019 Acute sinusitis, unspecified: Secondary | ICD-10-CM | POA: Diagnosis not present

## 2023-12-23 ENCOUNTER — Ambulatory Visit: Payer: No Typology Code available for payment source | Admitting: Family Medicine

## 2023-12-23 ENCOUNTER — Encounter (INDEPENDENT_AMBULATORY_CARE_PROVIDER_SITE_OTHER): Payer: Self-pay

## 2023-12-26 ENCOUNTER — Other Ambulatory Visit: Payer: Self-pay | Admitting: Family Medicine

## 2023-12-26 DIAGNOSIS — E88819 Insulin resistance, unspecified: Secondary | ICD-10-CM

## 2023-12-31 ENCOUNTER — Ambulatory Visit (HOSPITAL_BASED_OUTPATIENT_CLINIC_OR_DEPARTMENT_OTHER): Payer: 59 | Attending: Internal Medicine | Admitting: Internal Medicine

## 2023-12-31 VITALS — Ht 67.0 in | Wt 304.0 lb

## 2023-12-31 DIAGNOSIS — G4733 Obstructive sleep apnea (adult) (pediatric): Secondary | ICD-10-CM | POA: Diagnosis not present

## 2024-01-04 DIAGNOSIS — G4733 Obstructive sleep apnea (adult) (pediatric): Secondary | ICD-10-CM | POA: Diagnosis not present

## 2024-01-04 NOTE — Procedures (Signed)
 Patient Name: Paula Austin, Paula Austin Date: 12/31/2023 Gender: Female D.O.B: Sep 03, 1966 Age (years): 9 Referring Provider: Reggy Salt MD, ABSM Height (inches): 67 Interpreting Physician: Reggy Salt MD, ABSM Weight (lbs): 304 RPSGT: Primus Norris BMI: 48 MRN: [id number] Neck Size: 18.00  CLINICAL INFORMATION Sleep Study Type: Split Night CPAP Indication for sleep study: Obesity, OSA, Snoring, Witnessed Apneas Epworth Sleepiness Score: 12  SLEEP STUDY TECHNIQUE As per the AASM Manual for the Scoring of Sleep and Associated Events v2.3 (April 2016) with a hypopnea requiring 4% desaturations.  The channels recorded and monitored were frontal, central and occipital EEG, electrooculogram (EOG), submentalis EMG (chin), nasal and oral airflow, thoracic and abdominal wall motion, anterior tibialis EMG, snore microphone, electrocardiogram, and pulse oximetry. Continuous positive airway pressure (CPAP) was initiated when the patient met split night criteria and was titrated according to treat sleep-disordered breathing.  MEDICATIONS Medications self-administered by patient taken the night of the study : N/A  RESPIRATORY PARAMETERS Diagnostic  Total AHI (/hr): 57.9 RDI (/hr): 62.6 OA Index (/hr): 46.4 CA Index (/hr): 0.0 REM AHI (/hr): N/A NREM AHI (/hr): 57.9 Supine AHI (/hr): 108.2 Non-supine AHI (/hr): 45 Min O2 Sat (%): 77.00 Mean O2 (%): 94.78 Time below 88% (min): 10.5   Titration  Optimal Pressure (cm): 13 AHI at Optimal Pressure (/hr): 0 Min O2 at Optimal Pressure (%): 90.0 Supine % at Optimal (%): 100 Sleep % at Optimal (%): 100   SLEEP ARCHITECTURE The recording time for the entire night was 357.3 minutes.  During a baseline period of 190.5 minutes, the patient slept for 125.5 minutes in REM and nonREM, yielding a sleep efficiency of 65.9%. Sleep onset after lights out was 25.0 minutes with a REM latency of N/A minutes. The patient spent 21.91% of the night in stage  N1 sleep, 78.09% in stage N2 sleep, 0.00% in stage N3 and 0% in REM.  During the titration period of 161.9 minutes, the patient slept for 144.8 minutes in REM and nonREM, yielding a sleep efficiency of 89.5%. Sleep onset after CPAP initiation was 9.0 minutes with a REM latency of 16.0 minutes. The patient spent 3.11% of the night in stage N1 sleep, 55.93% in stage N2 sleep, 0.00% in stage N3 and 41% in REM.  CARDIAC DATA The 2 lead EKG demonstrated sinus rhythm. The mean heart rate was 82.02 beats per minute. Other EKG findings include: None.  LEG MOVEMENT DATA The total Periodic Limb Movements of Sleep (PLMS) were 108. The PLMS index was 23.97 .  IMPRESSIONS - Severe obstructive sleep apnea occurred during the diagnostic portion of the study (AHI = 57.9/hour). An optimal PAP pressure was selected for this patient ( 13 cm of water) - Mild oxygen desaturation was noted during the diagnostic portion of the study (Min O2 = 77.00%). On CPAP 13, minimum O2 saturation was 90%. - No snoring was audible during the diagnostic portion of the study. - No cardiac abnormalities were noted during this study. - Clinically significant periodic limb movements did not occur during sleep.  DIAGNOSIS - Obstructive Sleep Apnea (G47.33)  RECOMMENDATIONS - Trial of CPAP therapy on 13 cm H2O or autopap 8-18. - Patient wore a Medium size Resmed Full Face AirFit F20 mask and heated humidification. - Be careful with alcohol, sedatives and other CNS depressants that may worsen sleep apnea and disrupt normal sleep architecture. - Sleep hygiene should be reviewed to assess factors that may improve sleep quality. - Weight management and regular exercise should be initiated or  continued.  [Electronically signed] 01/04/2024 12:17 PM  Reggy Salt MD, ABSM Diplomate, American Board of Sleep Medicine NPI: [id number]                         Reggy Salt Diplomate, American Board of Sleep  Medicine  ELECTRONICALLY SIGNED ON:  01/04/2024, 12:15 PM [facility] PH: [phone]   FX: [phone] ACCREDITED BY THE AMERICAN ACADEMY OF SLEEP MEDICINE

## 2024-01-22 ENCOUNTER — Other Ambulatory Visit: Payer: Self-pay

## 2024-01-22 DIAGNOSIS — E890 Postprocedural hypothyroidism: Secondary | ICD-10-CM

## 2024-01-23 MED ORDER — LEVOTHYROXINE SODIUM 150 MCG PO TABS: 150.0000 ug | ORAL_TABLET | Freq: Every day | ORAL | 3 refills | Status: AC

## 2024-01-30 NOTE — Progress Notes (Unsigned)
02/26/23- -----Paula Austin 5 yoF never smoker for sleep evaluation courtesy of Dr Janit Pagan with concern of OSA Medical problem list includes HTN, Cholelithiasis, Hypothyroid, Morbid Obesity, hx Bariatric RnY 2014, Hyperlipidemia,  NPSG 06/13/11- AHI 101.2/ hr, desaturation to 68%, body weight 320 lbs, CPAP to 17 Epworth score-13 Body weight today-300 lbs. Patient had a sleep study in 2012. Was on a cpap machine previously, stopped using machine over a year ago. Patient is interested in [place] After bariatric surgery in 2014 she lost weight and adjusted away from CPAP.  She was finding it made her swallow air, suggesting pressure at that point was too high.  Also mask was irritating and it does not sound as if she got any help with refitting of mask. ENT surgery limited to thyroid resection.  Denies heart or lung disease.  No sleep medicines.  Drinks sweet tea but no other stimulants.  Family tells her that her snoring is very loud.  Some daytime sleepiness.  I reviewed the basics of sleep apnea, medical concerns, contributing factors, diagnostic and treatment options.  Because she is interested in [place] I recommended a split-night sleep study since some insurances will not except a home sleep test for this consideration.  09/12/23-  57 yoF never smoker followed for OSA, complicated by HTN, Cholelithiasis, Hypothyroid, Morbid Obesity, hx Bariatric RnY 2014, Hyperlipidemia,  NPSG 06/13/11- AHI 101.2/ hr, desaturation to 68%, body weight 320 lbs, CPAP to 17 Body weight today-299 lbs Has not followed through on orders this year for either HST or Split-nght study. She had been interested in [place]. Arrival BP 150/90. Sleep study was deferred due to insurance change, but ok to go ahead now.  Discussed diuretic. She wants to separate from other meds, to see if she can skip it when she has to drive. Remains interested in [place], but too heavy. Encouraged weight loss effort.  01/31/24- 57 yoF never smoker  followed for OSA, complicated by HTN, Cholelithiasis, Hypothyroid, Morbid Obesity, hx Bariatric RnY 2014, Hyperlipidemia NPSG 12/31/23- AHI 57.9/hr, desat to 77%, body weight 309 lbs, CPAP> 8-18 For treatment decision Had cholecystectomy in January Body weight today-309 lbs We discussed FDA approval of Zepbound to treat OSA by way of weight loss, and will suggest this to her PCP. Discussed the use of AI scribe software for clinical note transcription with the patient, who gave verbal consent to proceed.  History of Present Illness   The patient, with a history of sleep apnea, recently underwent a sleep study and gallbladder removal. She reports that the gallbladder surgery went well. The sleep study showed an improvement from previous studies, with the patient stopping breathing about fifty-seven times an hour, down from a hundred times an hour in previous studies. Despite this improvement, the patient's sleep apnea is still severe. The patient has previously tried CPAP therapy but found the mask uncomfortable and experienced issues with swallowing air, indicating that the pressure was too high. The patient is also struggling with weight gain, which is contributing to her sleep apnea and causing plantar fasciitis, making it difficult for her to walk. She has tried various weight loss methods without success. She asks about Zepbound, which is now FDA approved for managing OSA via weight loss. I suggested she ask her PCP to consider ordering it on that basis.   Assessment and Plan    Obstructive Sleep Apnea (OSA) Recent sleep study showed an improvement in apnea-hypopnea index (AHI) from 100 to 57 events per hour. Discussed the benefits of CPAP  therapy and the potential for Inspire therapy with further weight loss. Patient had previous issues with CPAP therapy including mask discomfort and aerophagia. -Plan to initiate CPAP therapy with a self-adjusting machine to improve comfort and  adherence. -Consider Inspire therapy in the future if significant weight loss is achieved.  Weight Management Patient has struggled with weight loss and is currently participating in a medical weight loss program. Discussed the potential use of tirzepatide for weight loss and its potential impact on OSA. -Recommend primary care provider consider prescribing tirzepatide for weight loss and OSA management. -Continue current weight loss efforts.  Follow-up in 3 months to assess CPAP adherence and effectiveness.      ROS-see HPI   + = positive Constitutional:    weight loss, night sweats, fevers, chills, fatigue, lassitude. HEENT:    headaches, difficulty swallowing, tooth/dental problems, sore throat,       sneezing, itching, ear ache, nasal congestion, post nasal drip, snoring CV:    chest pain, orthopnea, PND, swelling in lower extremities, anasarca,                                   dizziness, palpitations Resp:   shortness of breath with exertion or at rest.                productive cough,   non-productive cough, coughing up of blood.              change in color of mucus.  wheezing.   Skin:    rash or lesions. GI:  No-   heartburn, indigestion, abdominal pain, nausea, vomiting, diarrhea,                 change in bowel habits, loss of appetite GU: dysuria, change in color of urine, no urgency or frequency.   flank pain. MS:   joint pain, +stiffness, decreased range of motion, back pain. Neuro-     nothing unusual Psych:  change in mood or affect.  depression or anxiety.   memory loss.  OBJ- Physical Exam General- Alert, Oriented, Affect-appropriate, Distress- none acute, +morbid obesity,  Skin- rash-none, lesions- none, excoriation- none Lymphadenopathy- none Head- atraumatic            Eyes- Gross vision intact, PERRLA, conjunctivae and secretions clear            Ears- Hearing, canals-normal            Nose- Clear, no-Septal dev, mucus, polyps, erosion, perforation              Throat- Mallampati IV , mucosa clear , drainage- none, tonsils- atrophic, +teeth Neck- flexible , trachea midline, no stridor , thyroid nl, carotid no bruit Chest - symmetrical excursion , unlabored           Heart/CV- RRR , no murmur , no gallop  , no rub, nl s1 s2                           - JVD- none , edema- none, stasis changes- none, varices- none           Lung- clear to P&A, wheeze- none, cough- none , dullness-none, rub- none           Chest wall-  Abd-  Br/ Gen/ Rectal- Not done, not indicated Extrem- cyanosis- none, clubbing, none, atrophy- none, strength- nl Neuro- grossly  intact to observation

## 2024-01-31 ENCOUNTER — Ambulatory Visit: Payer: 59 | Admitting: Internal Medicine

## 2024-01-31 ENCOUNTER — Encounter: Payer: Self-pay | Admitting: Internal Medicine

## 2024-01-31 VITALS — BP 118/76 | HR 89 | Temp 98.2°F | Ht 67.0 in | Wt 309.6 lb

## 2024-01-31 DIAGNOSIS — G4733 Obstructive sleep apnea (adult) (pediatric): Secondary | ICD-10-CM

## 2024-01-31 NOTE — Patient Instructions (Signed)
Order- new DME, new CPAP auto 8-18, mask of choice, humidifier, supplies, AirView/ card  I recommend that your PCP consider starting you on Zepbound, which is now FDA approved to treat obstructive sleep apnea. If it helps enough, we might eventually be able to get off CPAP.

## 2024-02-01 ENCOUNTER — Encounter: Payer: Self-pay | Admitting: Family Medicine

## 2024-02-13 ENCOUNTER — Telehealth (INDEPENDENT_AMBULATORY_CARE_PROVIDER_SITE_OTHER): Payer: 59 | Admitting: Family Medicine

## 2024-02-13 ENCOUNTER — Encounter: Payer: Self-pay | Admitting: Family Medicine

## 2024-02-13 DIAGNOSIS — Z9884 Bariatric surgery status: Secondary | ICD-10-CM | POA: Diagnosis not present

## 2024-02-13 DIAGNOSIS — E88819 Insulin resistance, unspecified: Secondary | ICD-10-CM | POA: Diagnosis not present

## 2024-02-13 DIAGNOSIS — Z7985 Long-term (current) use of injectable non-insulin antidiabetic drugs: Secondary | ICD-10-CM | POA: Diagnosis not present

## 2024-02-13 DIAGNOSIS — E785 Hyperlipidemia, unspecified: Secondary | ICD-10-CM

## 2024-02-13 DIAGNOSIS — G4733 Obstructive sleep apnea (adult) (pediatric): Secondary | ICD-10-CM | POA: Diagnosis not present

## 2024-02-13 DIAGNOSIS — R7303 Prediabetes: Secondary | ICD-10-CM

## 2024-02-13 DIAGNOSIS — I10 Essential (primary) hypertension: Secondary | ICD-10-CM | POA: Diagnosis not present

## 2024-02-13 MED ORDER — TIRZEPATIDE-WEIGHT MANAGEMENT 2.5 MG/0.5ML ~~LOC~~ SOLN: 2.5000 mg | SUBCUTANEOUS | 0 refills | Status: DC

## 2024-02-13 NOTE — Progress Notes (Signed)
 Virtual Visit via Video Note  I connected with Paula Austin on 02/13/24 at  9:10 AM EST by a video enabled telemedicine application and verified that I am speaking with the correct person using two identifiers.  Location: Patient: car Provider: home   I discussed the limitations of evaluation and management by telemedicine and the availability of in person appointments. The patient expressed understanding and agreed to proceed.  History of Present Illness:  OBESITY - Meds: metformin 500mg  daily. Previously unable to get saxenda, wegovy. Now has newly diagnosed OSA, willing to try zepbound.  - [facility] with good efforts at diet and exercise.  - Walking getting more difficult due to chronic knee pain, working to get monovisc injections as CSI don't work anymore. Has had good efforts with diet. - H/o Roux-en-Y surgery 2014. Currently prediabetic with family history of diabetes.  - Complications of obesity: OSA, osteoarthritis, HTN, HLD, prediabetes - Peak weight: 343lb (prior to gastric bypass) - Current weight: 309lb - not on current GLP-1 agonist - no family history of thyroid cancer - is not pregnant or breastfeeding.   Observations/Objective: Well appearing, in NAD. Speaks in full sentences. Comfortable WOB on RA. No resp distress.    Assessment and Plan:  Problem List Items Addressed This Visit       Cardiovascular and Mediastinum   Hypertension   Relevant Medications   tirzepatide (ZEPBOUND) 2.5 MG/0.5ML injection vial     Respiratory   OSA (obstructive sleep apnea) - Primary   Severe. Now with CPAP but historically difficult due to sinus issues. Sleep medicine considering Inspire device but has to lose weight first. Rx Zepbound.      Relevant Medications   tirzepatide (ZEPBOUND) 2.5 MG/0.5ML injection vial     Endocrine   Insulin resistance   Relevant Medications   tirzepatide (ZEPBOUND) 2.5 MG/0.5ML injection vial      Other   Hyperlipidemia   Relevant Medications   tirzepatide (ZEPBOUND) 2.5 MG/0.5ML injection vial   Morbid obesity (HCC)   Continue efforts with diet, exercise. [facility] to [facility]. Rx zepbound.      Relevant Medications   tirzepatide (ZEPBOUND) 2.5 MG/0.5ML injection vial   Prediabetes   Relevant Medications   tirzepatide (ZEPBOUND) 2.5 MG/0.5ML injection vial   Weight gain following gastric bypass surgery   Relevant Medications   tirzepatide (ZEPBOUND) 2.5 MG/0.5ML injection vial   F/u 1 month.     I discussed the assessment and treatment plan with the patient. The patient was provided an opportunity to ask questions and all were answered. The patient agreed with the plan and demonstrated an understanding of the instructions.   The patient was advised to call back or seek an in-person evaluation if the symptoms worsen or if the condition fails to improve as anticipated.  I provided 6 minutes of non-face-to-face time during this encounter.   Caro Laroche, DO

## 2024-02-13 NOTE — Assessment & Plan Note (Signed)
 Continue efforts with diet, exercise. Continue to follow with [facility]. Rx zepbound.

## 2024-02-13 NOTE — Assessment & Plan Note (Signed)
 Severe. Now with CPAP but historically difficult due to sinus issues. Sleep medicine considering Inspire device but has to lose weight first. Rx Zepbound.

## 2024-02-13 NOTE — Patient Instructions (Signed)
 It was great to see you!  Our plans for today:  - We are going to try Zepbound.  - Come back in 1 month for follow up.   Take care and seek immediate care sooner if you develop any concerns.   Dr. Linwood Dibbles

## 2024-02-24 DIAGNOSIS — Z1231 Encounter for screening mammogram for malignant neoplasm of breast: Secondary | ICD-10-CM | POA: Diagnosis not present

## 2024-02-24 LAB — HM MAMMOGRAPHY

## 2024-02-26 DIAGNOSIS — J0140 Acute pansinusitis, unspecified: Secondary | ICD-10-CM | POA: Diagnosis not present

## 2024-02-28 ENCOUNTER — Encounter: Payer: Self-pay | Admitting: Family Medicine

## 2024-03-23 ENCOUNTER — Other Ambulatory Visit (INDEPENDENT_AMBULATORY_CARE_PROVIDER_SITE_OTHER): Payer: Self-pay

## 2024-03-23 DIAGNOSIS — E88819 Insulin resistance, unspecified: Secondary | ICD-10-CM

## 2024-04-08 ENCOUNTER — Ambulatory Visit (INDEPENDENT_AMBULATORY_CARE_PROVIDER_SITE_OTHER): Admitting: Orthopaedic Surgery

## 2024-04-08 ENCOUNTER — Encounter: Payer: Self-pay | Admitting: Orthopaedic Surgery

## 2024-04-08 ENCOUNTER — Telehealth: Payer: Self-pay

## 2024-04-08 DIAGNOSIS — G8929 Other chronic pain: Secondary | ICD-10-CM | POA: Diagnosis not present

## 2024-04-08 DIAGNOSIS — M1712 Unilateral primary osteoarthritis, left knee: Secondary | ICD-10-CM

## 2024-04-08 DIAGNOSIS — M25562 Pain in left knee: Secondary | ICD-10-CM | POA: Diagnosis not present

## 2024-04-08 NOTE — Telephone Encounter (Signed)
 Left knee gel injection ?

## 2024-04-08 NOTE — Progress Notes (Signed)
 The patient is well-known to us .  She has significant tricompartment arthritis in her left knee.  She has tried and failed all forms of conservative treatment and it has been 6 months since she had hyaluronic acid with Monovisc in her left knee to treat the pain from osteoarthritis.  It is just hard to wear off and she is interested in having that type of injection again.  She is an active 58 year old female.  She still wants to hold off on any type of surgical intervention and I agree with this given the fact that hyaluronic acid has helped her significantly.  She denies any acute change in her medical status.  I was able to review her medications and past medical history within epic.  Examination of her left knee today shows varus malalignment that is correctable.  There is significant medial joint line tenderness and patellofemoral crepitation throughout the arc of motion of her left knee.  There is no effusion.  The knee is ligamentously stable.  Previous x-rays of the left knee show tricompartmental osteoarthritis.  She is still an excellent candidate for hyaluronic acid for her left knee given the fact that this has helped her significantly in the past and she has failed other conservative treatment measures.  Will work on getting this ordered for her left knee once again and get her back in the office for the injection as soon as it is approved and ordered.  This patient is diagnosed with osteoarthritis of the knee(s).    Radiographs show evidence of joint space narrowing, osteophytes, subchondral sclerosis and/or subchondral cysts.  This patient has knee pain which interferes with functional and activities of daily living.    This patient has experienced inadequate response, adverse effects and/or intolerance with conservative treatments such as acetaminophen , NSAIDS, topical creams, physical therapy or regular exercise, knee bracing and/or weight loss.   This patient has experienced inadequate  response or has a contraindication to intra articular steroid injections for at least 3 months.   This patient is not scheduled to have a total knee replacement within 6 months of starting treatment with viscosupplementation.

## 2024-04-16 ENCOUNTER — Encounter: Payer: Self-pay | Admitting: Orthopaedic Surgery

## 2024-04-20 NOTE — Telephone Encounter (Signed)
 VOB has been submitted for Monovisc, left knee.

## 2024-04-23 ENCOUNTER — Telehealth: Payer: Self-pay | Admitting: Internal Medicine

## 2024-04-23 NOTE — Telephone Encounter (Signed)
 Cmn received from [employer] for "REPLMT FULL FACE CUSH 1/month".

## 2024-04-27 NOTE — Telephone Encounter (Signed)
 CMN faxed successfully and signed.

## 2024-04-28 ENCOUNTER — Other Ambulatory Visit: Payer: Self-pay

## 2024-04-28 DIAGNOSIS — M1712 Unilateral primary osteoarthritis, left knee: Secondary | ICD-10-CM

## 2024-05-07 NOTE — Telephone Encounter (Signed)
 Copied from CRM [id number]. Topic: Appointments - Scheduling Inquiry for Clinic >> May 07, 2024  9:02 AM Ilene Malling wrote: Reason for CRM: Patient [phone] states [employer] advised patient needs to bee seen before 06/10/24 for cpap equipment and supplies coverage, if not patient will have to pay out of pocket. Patient has an appointment for 07/06/24 with Dr. Linder Revere and is on the wait list, however patient wants to see if she can be added in due to the timefrace required from [facility]. Patient is asking for a response, she states she message on Mychart and no responses. Please call back or communicate via Mychart today. Please advise.   Called pt lvm to call back routing to front desk for her to scheduled with either NP Fleet Huh for cpap f/u before June 25th

## 2024-05-12 ENCOUNTER — Telehealth: Payer: Self-pay

## 2024-05-12 NOTE — Telephone Encounter (Signed)
 Copied from CRM [id number]. Topic: Appointments - Scheduling Inquiry for Clinic >> May 07, 2024  9:02 AM Ilene Malling wrote: Reason for CRM: Patient [phone] states [employer] advised patient needs to bee seen before 06/10/24 for cpap equipment and supplies coverage, if not patient will have to pay out of pocket. Patient has an appointment for 07/06/24 with Dr. Linder Revere and is on the wait list, however patient wants to see if she can be added in due to the timefrace required from [facility]. Patient is asking for a response, she states she message on Mychart and no responses. Please call back or communicate via Mychart today. Please advise.   Scheduled patient with tammy parrett In [facility] on June 19th at 11:30

## 2024-05-14 ENCOUNTER — Encounter: Payer: Self-pay | Admitting: Physician Assistant

## 2024-05-14 ENCOUNTER — Ambulatory Visit: Admitting: Physician Assistant

## 2024-05-14 DIAGNOSIS — M1712 Unilateral primary osteoarthritis, left knee: Secondary | ICD-10-CM

## 2024-05-14 MED ORDER — HYALURONAN 88 MG/4ML IX SOSY
88.0000 mg | PREFILLED_SYRINGE | INTRA_ARTICULAR | Status: AC | PRN
  Administered 2024-05-14: 88 mg via INTRA_ARTICULAR

## 2024-05-14 MED ORDER — LIDOCAINE HCL 1 % IJ SOLN
4.0000 mL | INTRAMUSCULAR | Status: AC | PRN
  Administered 2024-05-14: 4 mL

## 2024-05-14 NOTE — Progress Notes (Addendum)
   Procedure Note  Patient: Paula Austin             Date of Birth: [birth year]           MRN: [id number]             Visit Date: 05/14/2024  HPI: Paula Austin 58 year old female with known osteoarthritis left knee.  She has failed conservative treatment which included cortisone injections and exercise.  She has no scheduled surgery on the left knee in the next 6 months.  She has had no new injury to the knee.  The previous viscosupplementation injections have been very beneficial.  Left knee: Good range of motion.  Patellofemoral crepitus with range of motion.  No abnormal warmth erythema.  No instability.  Slight edema plus minus effusion.  Varus malalignment.  Procedures: Visit Diagnoses:  1. Unilateral primary osteoarthritis, left knee     Large Joint Inj: L knee on 05/14/2024 3:08 PM Indications: pain Details: 22 G 1.5 in needle, superolateral approach  Arthrogram: No  Medications: 88 mg Hyaluronan 88 MG/4ML; 4 mL lidocaine  1 % Aspirate: 1 mL yellow Outcome: tolerated well, no immediate complications Procedure, treatment alternatives, risks and benefits explained, specific risks discussed. Consent was given by the patient. Immediately prior to procedure a time out was called to verify the correct patient, procedure, equipment, support staff and site/side marked as required. Patient was prepped and draped in the usual sterile fashion.     Plan: She knows to wait 6 months between viscosupplementation injections.  She tolerated the injection well today.  Questions were encouraged and answered.

## 2024-06-04 ENCOUNTER — Ambulatory Visit (HOSPITAL_BASED_OUTPATIENT_CLINIC_OR_DEPARTMENT_OTHER): Admitting: Adult Health

## 2024-06-04 ENCOUNTER — Encounter (HOSPITAL_BASED_OUTPATIENT_CLINIC_OR_DEPARTMENT_OTHER): Payer: Self-pay | Admitting: Adult Health

## 2024-06-04 VITALS — BP 119/78 | HR 81 | Ht 67.0 in | Wt 305.0 lb

## 2024-06-04 DIAGNOSIS — G4733 Obstructive sleep apnea (adult) (pediatric): Secondary | ICD-10-CM

## 2024-06-04 NOTE — Progress Notes (Signed)
 @Patient  ID: Odean Bend, female    DOB: 05/17/1966, 58 y.o.   MRN: [id number]  Chief Complaint  Patient presents with   Follow-up    Obstructive sleep apnea    Referring provider: Kandis Ormond, DO  HPI: 58 yo female followed for severe OSA  Medical history significant for morbid obesity status post bariatric surgery in 2014  TEST/EVENTS :  NPSG 06/13/11- AHI 101.2/ hr, desaturation to 68%, body weight 320 lbs, CPAP to 17   06/04/2024 Follow up: OSA  Patient returns for a follow-up visit.  She has severe obstructive sleep apnea is on nocturnal CPAP.  Patient says she is doing very well on CPAP.  She wears her CPAP every single night.  Feels that she benefits from CPAP.  Patient recently got a new CPAP machine and says it is working well.  CPAP download shows excellent compliance with daily average usage at 5 hours.  She is on auto CPAP 8 to 18 cm H2O.  Daily average pressure of 14.9 cm H2O.  AHI 1.5/hour.      Allergies  Allergen Reactions   Oxycodone Anxiety    Objects moving around room; felt she was out of control and felt she was going crazy    Immunization History  Administered Date(s) Administered   Influenza Split 11/23/2011   Influenza, Seasonal, Injecte, Preservative Fre 10/11/2023   Influenza,inj,Quad PF,6+ Mos 09/09/2013, 08/16/2016, 09/18/2021, 01/17/2023   Influenza-Unspecified 09/30/2019   Moderna Sars-Covid-2 Vaccination 03/04/2020, 04/05/2020, 10/07/2020   PPD Test 06/22/2016   Pfizer(Comirnaty)Fall Seasonal Vaccine 12 years and older 02/04/2023, 04/01/2023   Tdap 08/16/2016    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   had thick neck , trouble with intubatuion sept sept 2014 also   Family history of diabetes mellitus 01/17/2023   Gallbladder disease    GERD (gastroesophageal reflux disease)    occasional, none lately   Goiter    removed in 1999   History of kidney stones     Hyperlipidemia    Hypertension    Hypothyroidism    Joint pain    Morbid obesity (HCC)    OSA on CPAP    uses cpap some nights, pt does know settings   PONV (postoperative nausea and vomiting) 08/17/2013   lots of coughing up phelgm   Pre-diabetes    Sleep apnea    cpap machine   Thyroid  disease     Tobacco History: Social History   Tobacco Use  Smoking Status Former   Types: Cigars  Smokeless Tobacco Never   Counseling given: Not Answered   Outpatient Medications Prior to Visit  Medication Sig Dispense Refill   cyanocobalamin  1000 MCG tablet Take 1,000 mcg by mouth daily.      levothyroxine  (SYNTHROID ) 150 MCG tablet Take 1 tablet (150 mcg total) by mouth daily before breakfast. 90 tablet 3   lisinopril -hydrochlorothiazide  (ZESTORETIC ) 20-25 MG tablet Take 1 tablet by mouth daily. 90 tablet 3   metFORMIN  (GLUCOPHAGE ) 500 MG tablet Take 1 tablet (500 mg total) by mouth daily with breakfast. (Patient not taking: Reported on 06/04/2024) 30 tablet 0   tirzepatide  (ZEPBOUND ) 2.5 MG/0.5ML injection vial Inject 2.5 mg into the skin once a week. (Patient not taking: Reported on 06/04/2024) 2 mL 0   No facility-administered medications prior to visit.     Review of Systems:   Constitutional:   No  weight loss, night sweats,  Fevers, chills,  fatigue, or  lassitude.  HEENT:   No headaches,  Difficulty swallowing,  Tooth/dental problems, or  Sore throat,                No sneezing, itching, ear ache, nasal congestion, post nasal drip,   CV:  No chest pain,  Orthopnea, PND, swelling in lower extremities, anasarca, dizziness, palpitations, syncope.   GI  No heartburn, indigestion, abdominal pain, nausea, vomiting, diarrhea, change in bowel habits, loss of appetite, bloody stools.   Resp: No shortness of breath with exertion or at rest.  No excess mucus, no productive cough,  No non-productive cough,  No coughing up of blood.  No change in color of mucus.  No wheezing.  No chest wall  deformity  Skin: no rash or lesions.  GU: no dysuria, change in color of urine, no urgency or frequency.  No flank pain, no hematuria   MS:  No joint pain or swelling.  No decreased range of motion.  No back pain.    Physical Exam  BP 119/78   Pulse 81   Ht 5' 7 (1.702 m)   Wt (!) 305 lb (138.3 kg)   LMP 04/29/2016   SpO2 100%   BMI 47.77 kg/m   GEN: A/Ox3; pleasant , NAD, well nourished    HEENT:  [place]/AT,  EACs-clear, TMs-wnl, NOSE-clear, THROAT-clear, no lesions, no postnasal drip or exudate noted.   NECK:  Supple w/ fair ROM; no JVD; normal carotid impulses w/o bruits; no thyromegaly or nodules palpated; no lymphadenopathy.    RESP  Clear  P & A; w/o, wheezes/ rales/ or rhonchi. no accessory muscle use, no dullness to percussion  CARD:  RRR, no m/r/g, no peripheral edema, pulses intact, no cyanosis or clubbing.  GI:   Soft & nt; nml bowel sounds; no organomegaly or masses detected.   Musco: Warm bil, no deformities or joint swelling noted.   Neuro: alert, no focal deficits noted.    Skin: Warm, no lesions or rashes    Lab Results:  CBC    BNP No results found for: BNP  ProBNP No results found for: PROBNP  Imaging:       No data to display          No results found for: NITRICOXIDE      Assessment & Plan:  Assessment and Plan    Obstructive Sleep Apnea   She has severe obstructive sleep apnea with 101 episodes per hour and associated hypoxemia. She is compliant with CPAP therapy using a full face mask, achieving good pressure levels.Consistent CPAP use is crucial to prevent complications such as cardiovascular and neurological issues. The potential use of Zepbound  for weight loss, which could improve sleep apnea, was explored, but insurance coverage is a barrier. Zepbound  shows promise in improving sleep apnea and aiding weight loss, but it is not covered by her current insurance plan. A cash pay program is available but costly.  Encourage consistent CPAP use for >6hr use each night. Consider setting alarms or reminders to ensure CPAP use prior to  falling asleep. Discuss with insurance about Zepbound  coverage for sleep apnea and potential appeal process.   Prediabetes   She is prediabetic, . Continue to discuss Zepbound  with PCP as a potential option with insurance, emphasizing its benefits for prediabetes. Encourage lifestyle modifications to manage prediabetes.      Morbid obesity  Healthy weight loss. Consider Zepbound  .   Roena Clark, NP 06/04/2024

## 2024-06-04 NOTE — Patient Instructions (Signed)
 Continue on CPAP At bedtime   Keep up good work.  Do not drive if sleepy  Work on healthy weight loss  Continue to discuss if Zepbound  would be an option for your to help with weight loss with Sleep apnea.  Follow up in 1 year and As needed

## 2024-06-05 ENCOUNTER — Encounter: Payer: Self-pay | Admitting: Family Medicine

## 2024-06-08 ENCOUNTER — Other Ambulatory Visit (HOSPITAL_COMMUNITY): Payer: Self-pay

## 2024-06-08 ENCOUNTER — Telehealth: Payer: Self-pay

## 2024-06-08 NOTE — Telephone Encounter (Signed)
 Pharmacy Patient Advocate Encounter   Received notification from Physician's Office that prior authorization for ZEPBOUND  2.5MG  is required/requested.   Insurance verification completed.   The patient is insured through [facility] .   PA required; PA submitted to above mentioned insurance via CoverMyMeds Key/confirmation #/EOC [id number]. Status is pending

## 2024-06-09 ENCOUNTER — Telehealth: Payer: Self-pay

## 2024-06-09 NOTE — Telephone Encounter (Signed)
 Pharmacy Patient Advocate Encounter  Received notification from [facility] / [employer] that Prior Authorization for ZEPBOUND  2.5MG  has been DENIED.  Full denial letter will be uploaded to the media tab. See denial reason below.  *Drug Not Covered/Plan Exclusion- Your request for coverage was denied because your prescription benefit plan does not cover the requested medication. This decision relates specifically to coverage provided under your prescription benefit plan and does not involve any determination of medical judgment.

## 2024-06-09 NOTE — Telephone Encounter (Signed)
 Patient messaged via MyChart to have an appointment to discuss appeal for Zepbound  with PCP.  Called patient and was able to schedule patient a MyChart Video to inform Dr. Madelon what the Nurse practitioner from pulmonary office stated about an appeal for Zepbound .  MyChart Video appointment is set for 06/15/2024 at 9:30 am  Harlene Reiter, CMA

## 2024-06-12 DIAGNOSIS — G4733 Obstructive sleep apnea (adult) (pediatric): Secondary | ICD-10-CM | POA: Diagnosis not present

## 2024-06-13 DIAGNOSIS — G4733 Obstructive sleep apnea (adult) (pediatric): Secondary | ICD-10-CM | POA: Diagnosis not present

## 2024-06-15 ENCOUNTER — Encounter: Payer: Self-pay | Admitting: Family Medicine

## 2024-06-15 ENCOUNTER — Telehealth: Admitting: Family Medicine

## 2024-06-15 DIAGNOSIS — Z9884 Bariatric surgery status: Secondary | ICD-10-CM

## 2024-06-15 DIAGNOSIS — G4733 Obstructive sleep apnea (adult) (pediatric): Secondary | ICD-10-CM

## 2024-06-15 DIAGNOSIS — R7303 Prediabetes: Secondary | ICD-10-CM | POA: Diagnosis not present

## 2024-06-15 NOTE — Progress Notes (Signed)
 Virtual Visit via Video Note  I connected with Paula Austin on 06/15/24 at  9:30 AM EDT by a video enabled telemedicine application and verified that I am speaking with the correct person using two identifiers.  Location: Patient: work Provider: [facility]   I discussed the limitations of evaluation and management by telemedicine and the availability of in person appointments. The patient expressed understanding and agreed to proceed.  History of Present Illness:  Zepbound  authorization - saw Pulmonary/Sleep Medicine for OSA, recommended retrying Zepbound  - initially prescribed in August for weight loss, wasn't covered by insurance - Resubmitted for approval last week with OSA diagnosis, again wasn't covered.  Drug Not Covered/Plan Exclusion- Your request for coverage was denied because your prescription benefit plan does not cover the requested medication. This decision relates specifically to coverage provided under your prescription benefit plan and does not involve any determination of medical judgment.   - still trying to lose weight with good efforts with diet and exercise though walking is difficult with chronic knee pain, working to get monovisc injections.  - H/o Roux-en-Y surgery 2014. Currently prediabetic with family history of diabetes.  - Complications of obesity: OSA, osteoarthritis, HTN, HLD, prediabetes - Peak weight: 343lb (prior to gastric bypass) - Current weight: 305lb (down 4 lbs since last visit 4 months ago) - not on current GLP-1 agonist - no family history of thyroid  cancer - is not pregnant or breastfeeding. - unable to take metformin  due to awaiting gallbladder surgery.    Observations/Objective: Well appearing, in NAD. Speaks in full sentences. Comfortable WOB on RA. No resp distress.   Assessment and Plan:  OSA, Obesity PA for Zepbound  denied twice. Appears due to prescription benefit plan, not due to medical necessity. Sent patient MyChart message  with verbage received from PA. Agree needs assistance with weight loss for obesity, OSA, HLD, HTN, prediabetes. Continue with lifestyle efforts.    I discussed the assessment and treatment plan with the patient. The patient was provided an opportunity to ask questions and all were answered. The patient agreed with the plan and demonstrated an understanding of the instructions.   The patient was advised to call back or seek an in-person evaluation if the symptoms worsen or if the condition fails to improve as anticipated.  I provided 7 minutes of non-face-to-face time during this encounter.   Donald CHRISTELLA Lai, DO

## 2024-07-03 ENCOUNTER — Other Ambulatory Visit: Payer: Self-pay | Admitting: Family Medicine

## 2024-07-03 DIAGNOSIS — I10 Essential (primary) hypertension: Secondary | ICD-10-CM

## 2024-07-04 NOTE — Progress Notes (Deleted)
 HPI F never smoker followed for OSA, complicated by HTN, Cholelithiasis, Hypothyroid, Morbid Obesity, hx Bariatric RnY 2014, Hyperlipidemia NPSG 06/13/11- AHI 101.2/ hr, desaturation to 68%, body weight 320 lbs, CPAP to 17 NPSG 12/31/23- AHI 57.9/hr, desat to 77%, body weight 309 lbs, CPAP> 8-18  ==========================================================================================   01/31/24- 57 yoF never smoker followed for OSA, complicated by HTN, Cholelithiasis, Hypothyroid, Morbid Obesity, hx Bariatric RnY 2014, Hyperlipidemia NPSG 12/31/23- AHI 57.9/hr, desat to 77%, body weight 309 lbs, CPAP> 8-18 For treatment decision Had cholecystectomy in January Body weight today-309 lbs We discussed FDA approval of Zepbound  to treat OSA by way of weight loss, and will suggest this to her PCP. Discussed the use of AI scribe software for clinical note transcription with the patient, who gave verbal consent to proceed.  History of Present Illness   The patient, with a history of sleep apnea, recently underwent a sleep study and gallbladder removal. She reports that the gallbladder surgery went well. The sleep study showed an improvement from previous studies, with the patient stopping breathing about fifty-seven times an hour, down from a hundred times an hour in previous studies. Despite this improvement, the patient's sleep apnea is still severe. The patient has previously tried CPAP therapy but found the mask uncomfortable and experienced issues with swallowing air, indicating that the pressure was too high. The patient is also struggling with weight gain, which is contributing to her sleep apnea and causing plantar fasciitis, making it difficult for her to walk. She has tried various weight loss methods without success. She asks about Zepbound , which is now FDA approved for managing OSA via weight loss. I suggested she ask her PCP to consider ordering it on that basis.   Assessment and Plan     Obstructive Sleep Apnea (OSA) Recent sleep study showed an improvement in apnea-hypopnea index (AHI) from 100 to 57 events per hour. Discussed the benefits of CPAP therapy and the potential for Inspire therapy with further weight loss. Patient had previous issues with CPAP therapy including mask discomfort and aerophagia. -Plan to initiate CPAP therapy with a self-adjusting machine to improve comfort and adherence. -Consider Inspire therapy in the future if significant weight loss is achieved.  Weight Management Patient has struggled with weight loss and is currently participating in a medical weight loss program. Discussed the potential use of tirzepatide  for weight loss and its potential impact on OSA. -Recommend primary care provider consider prescribing tirzepatide  for weight loss and OSA management. -Continue current weight loss efforts.  Follow-up in 3 months to assess CPAP adherence and effectiveness.     07/06/24- 58 yoF never smoker followed for OSA, complicated by HTN, Cholelithiasis, Hypothyroid, Morbid Obesity, hx Bariatric RnY 2014, Hyperlipidemia NPSG 12/31/23- AHI 57.9/hr, desat to 77%, body weight 309 lbs, CPAP> 8-18 CPAP 8/18/[employer]  ordered 01/31/24 Download compliance Body weight today- OV with NP in June documented good compliance and control. Discussed Zepbound  insurance difficulty.    ROS-see HPI   + = positive Constitutional:    weight loss, night sweats, fevers, chills, fatigue, lassitude. HEENT:    headaches, difficulty swallowing, tooth/dental problems, sore throat,       sneezing, itching, ear ache, nasal congestion, post nasal drip, snoring CV:    chest pain, orthopnea, PND, swelling in lower extremities, anasarca,                                   dizziness, palpitations  Resp:   shortness of breath with exertion or at rest.                productive cough,   non-productive cough, coughing up of blood.              change in color of mucus.  wheezing.   Skin:     rash or lesions. GI:  No-   heartburn, indigestion, abdominal pain, nausea, vomiting, diarrhea,                 change in bowel habits, loss of appetite GU: dysuria, change in color of urine, no urgency or frequency.   flank pain. MS:   joint pain, +stiffness, decreased range of motion, back pain. Neuro-     nothing unusual Psych:  change in mood or affect.  depression or anxiety.   memory loss.  OBJ- Physical Exam General- Alert, Oriented, Affect-appropriate, Distress- none acute, +morbid obesity,  Skin- rash-none, lesions- none, excoriation- none Lymphadenopathy- none Head- atraumatic            Eyes- Gross vision intact, PERRLA, conjunctivae and secretions clear            Ears- Hearing, canals-normal            Nose- Clear, no-Septal dev, mucus, polyps, erosion, perforation             Throat- Mallampati IV , mucosa clear , drainage- none, tonsils- atrophic, +teeth Neck- flexible , trachea midline, no stridor , thyroid  nl, carotid no bruit Chest - symmetrical excursion , unlabored           Heart/CV- RRR , no murmur , no gallop  , no rub, nl s1 s2                           - JVD- none , edema- none, stasis changes- none, varices- none           Lung- clear to P&A, wheeze- none, cough- none , dullness-none, rub- none           Chest wall-  Abd-  Br/ Gen/ Rectal- Not done, not indicated Extrem- cyanosis- none, clubbing, none, atrophy- none, strength- nl Neuro- grossly intact to observation

## 2024-07-06 ENCOUNTER — Ambulatory Visit: Admitting: Internal Medicine

## 2024-07-13 DIAGNOSIS — G4733 Obstructive sleep apnea (adult) (pediatric): Secondary | ICD-10-CM | POA: Diagnosis not present

## 2024-07-27 ENCOUNTER — Encounter: Payer: Self-pay | Admitting: Family Medicine

## 2024-07-27 ENCOUNTER — Ambulatory Visit: Admitting: Family Medicine

## 2024-07-27 VITALS — BP 127/72 | HR 92 | Ht 67.0 in | Wt 308.8 lb

## 2024-07-27 DIAGNOSIS — Z1211 Encounter for screening for malignant neoplasm of colon: Secondary | ICD-10-CM | POA: Diagnosis not present

## 2024-07-27 DIAGNOSIS — E785 Hyperlipidemia, unspecified: Secondary | ICD-10-CM

## 2024-07-27 DIAGNOSIS — E890 Postprocedural hypothyroidism: Secondary | ICD-10-CM

## 2024-07-27 DIAGNOSIS — Z9884 Bariatric surgery status: Secondary | ICD-10-CM | POA: Diagnosis not present

## 2024-07-27 DIAGNOSIS — R7303 Prediabetes: Secondary | ICD-10-CM | POA: Diagnosis not present

## 2024-07-27 DIAGNOSIS — K8020 Calculus of gallbladder without cholecystitis without obstruction: Secondary | ICD-10-CM | POA: Diagnosis not present

## 2024-07-27 DIAGNOSIS — Z789 Other specified health status: Secondary | ICD-10-CM | POA: Diagnosis not present

## 2024-07-27 DIAGNOSIS — I10 Essential (primary) hypertension: Secondary | ICD-10-CM

## 2024-07-27 LAB — POCT GLYCOSYLATED HEMOGLOBIN (HGB A1C): HbA1c, POC (controlled diabetic range): 5.7 % (ref 0.0–7.0)

## 2024-07-27 NOTE — Patient Instructions (Signed)
 It was great to see you!  Our plans for today:  - We are referring you to GI for your colonoscopy. Let us  know if you 't hear about an appointment in the next few weeks.  - No changes to your medications.   We are checking some labs today, we will release these results to your MyChart.  Take care and seek immediate care sooner if you develop any concerns.   Dr. Antrone Walla

## 2024-07-27 NOTE — Assessment & Plan Note (Signed)
-   Checked lipids

## 2024-07-27 NOTE — Assessment & Plan Note (Signed)
-   A1c improved to 5.7 from 6.0  - Continue lifestyle modifications with focus on diet and exercise, since insurance did not coverZepbound

## 2024-07-27 NOTE — Progress Notes (Signed)
    SUBJECTIVE:   CHIEF COMPLAINT / HPI:   Patient is here for referral to colonoscopy and for routine labs/screenings. Patient does not endorse any GI symptoms such as nausea/vomiting, changes in stool frequency/consistency, abdominal pain, or bloody stools.  PERTINENT  PMH / PSH: HTN, OSA, Cholecystectomy, hypothyroidism, Lap Roux Y Gastric Bypass, prediabetes  OBJECTIVE:   BP 127/72   Pulse 92   Ht 5' 7 (1.702 m)   Wt (!) 308 lb 12.8 oz (140.1 kg)   LMP 04/29/2016   SpO2 99%   BMI 48.36 kg/m   General: Well-appearing, NAD Cardio: Regular rate and rhythm, no M/R/G Respiratory: Normal WOB, no wheezes or crackles Abdominal: non-distended, no tenderness to palpation  ASSESSMENT/PLAN:   Assessment & Plan Prediabetes - A1c improved to 5.7 from 6.0  - Continue lifestyle modifications with focus on diet and exercise, since insurance did not coverZepbound Screening for colon cancer - Patient had colonoscopy in 2015 which was negative for polyps or cancerous lesions.   - Referral for colonoscopy Varicella vaccination status unknown - Discussed shingles vaccination, patient believes she never had chicken pox. 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 I developed and verified the  management plan that is described in the medical student's note, and I agree with the content with my edits above.   Donald Lai, DO [facility]

## 2024-07-27 NOTE — Assessment & Plan Note (Signed)
-   BP well-controlled today at 127/72 on lisinopril  and hydrochlorothiazide

## 2024-07-27 NOTE — Assessment & Plan Note (Signed)
-   On Synthroid ; TSH ordered today to assess dosing adequacy

## 2024-07-27 NOTE — Assessment & Plan Note (Signed)
-   S/p cholecystectomy, no current issues

## 2024-07-27 NOTE — Assessment & Plan Note (Signed)
-   Monitor nutritional status with periodic labs; encourage compliance with supplementation

## 2024-07-28 ENCOUNTER — Ambulatory Visit: Payer: Self-pay | Admitting: Family Medicine

## 2024-07-28 DIAGNOSIS — E88819 Insulin resistance, unspecified: Secondary | ICD-10-CM

## 2024-07-28 LAB — COMPREHENSIVE METABOLIC PANEL WITH GFR
ALT: 21 IU/L (ref 0–32)
AST: 24 IU/L (ref 0–40)
Albumin: 4.1 g/dL (ref 3.8–4.9)
Alkaline Phosphatase: 106 IU/L (ref 44–121)
BUN/Creatinine Ratio: 14 (ref 12–28)
BUN: 12 mg/dL (ref 8–27)
Bilirubin Total: 0.5 mg/dL (ref 0.0–1.2)
CO2: 22 mmol/L (ref 20–29)
Calcium: 9.7 mg/dL (ref 8.7–10.3)
Chloride: 103 mmol/L (ref 96–106)
Creatinine, Ser: 0.83 mg/dL (ref 0.57–1.00)
Globulin, Total: 3.3 g/dL (ref 1.5–4.5)
Glucose: 107 mg/dL — ABNORMAL HIGH (ref 70–99)
Potassium: 4.1 mmol/L (ref 3.5–5.2)
Sodium: 144 mmol/L (ref 134–144)
Total Protein: 7.4 g/dL (ref 6.0–8.5)
eGFR: 81 mL/min/1.73 (ref >59)

## 2024-07-28 LAB — VARICELLA ZOSTER ABS, IGG/IGM
Varicella IgM: <0.91 {index} (ref 0.00–0.90)
Varicella zoster IgG: REACTIVE

## 2024-07-28 LAB — TSH: TSH: 1.22 u[IU]/mL (ref 0.450–4.500)

## 2024-08-07 MED ORDER — METFORMIN HCL 500 MG PO TABS: 500.0000 mg | ORAL_TABLET | Freq: Every day | ORAL | 0 refills | Status: DC

## 2024-08-13 DIAGNOSIS — G4733 Obstructive sleep apnea (adult) (pediatric): Secondary | ICD-10-CM | POA: Diagnosis not present

## 2024-09-10 ENCOUNTER — Encounter: Payer: Self-pay | Admitting: Gastroenterology

## 2024-09-10 DIAGNOSIS — G4733 Obstructive sleep apnea (adult) (pediatric): Secondary | ICD-10-CM | POA: Diagnosis not present

## 2024-09-13 DIAGNOSIS — G4733 Obstructive sleep apnea (adult) (pediatric): Secondary | ICD-10-CM | POA: Diagnosis not present

## 2024-09-14 ENCOUNTER — Telehealth: Payer: Self-pay | Admitting: *Deleted

## 2024-09-14 ENCOUNTER — Other Ambulatory Visit: Payer: Self-pay

## 2024-09-14 NOTE — Telephone Encounter (Signed)
  Paula Austin,  This pt is a documented difficult intubation and her procedure will need to be done at the hospital.   Regards,  Norleen EMERSON Schillings

## 2024-09-14 NOTE — Telephone Encounter (Signed)
 TE has been sent by Orie Chihuahua, RN making the MD aware

## 2024-09-15 ENCOUNTER — Encounter

## 2024-09-15 ENCOUNTER — Telehealth: Payer: Self-pay

## 2024-09-15 NOTE — Telephone Encounter (Signed)
 LVM reminding patient pre-visit, in person, appointment for today (09/15/2024) at 4:00pm has been cancelled. [facility] number given for patient to call us  back.

## 2024-09-15 NOTE — Telephone Encounter (Signed)
 RN left another voicemail reminding patient in-person PV appointment today (09/15/24) at 4:00pm was cancelled.  RN Informed patient will call her again before 5PM to go over everything.

## 2024-09-15 NOTE — Telephone Encounter (Signed)
 Rn explained to patient that pre-visit and colonoscopy at [facility] was cancelled due to prior history of difficult intubation. RN explained, per policy, procedure will be performed at the hospital for her safety.  Informed patient that Dr. Clayburn nurse will contact her with the new date and time for her colonoscopy at the hospital, along with her pre-visit. Patient verbalizes understanding.

## 2024-09-15 NOTE — Telephone Encounter (Signed)
 Left voicemail to notify patient in person pre-visit was cancelled today at 4:00 pm. RN will call back.

## 2024-09-15 NOTE — Telephone Encounter (Signed)
 Left message informing in person pre-visit was cancelled today at 4:00pm. RN will call back to explain why.

## 2024-09-15 NOTE — Telephone Encounter (Signed)
 Spoke with patient and informed her that you will be contacting her about scheduling her procedure at the hospital.

## 2024-09-16 NOTE — Telephone Encounter (Signed)
 This patient has not previously been seen by me or another provider at this practice. She can be put on for the next available hospital outpatient procedure slot with any physician.  Please communicate with clinic nursing regarding any wait list that they may be keeping for that purpose.  H Danis

## 2024-09-17 NOTE — Telephone Encounter (Signed)
 See telephone encounter from Dr Legrand of 09/14/24. She was previously a Dr Rolan Cedar patient. Per Dr Legrand the patient can be schedule with any available provider for her hospital case-screening colonoscopy.

## 2024-09-17 NOTE — Telephone Encounter (Signed)
 Clarified with pt that procedure and PV originally has been canceled and that once new time and date for hospital jprocedure has been made she will here from MD RN. Pt states she understands and has no questions at this time.

## 2024-09-18 ENCOUNTER — Other Ambulatory Visit: Payer: Self-pay

## 2024-09-18 DIAGNOSIS — Z1211 Encounter for screening for malignant neoplasm of colon: Secondary | ICD-10-CM

## 2024-09-18 NOTE — Telephone Encounter (Signed)
 Spoke with the patient. Scheduled for her pre-visit 10/12/24 at 10:30 am. Colonoscopy at [facility] on 10/22/24 start time 11:00 am.

## 2024-09-29 ENCOUNTER — Encounter: Admitting: Gastroenterology

## 2024-09-30 ENCOUNTER — Other Ambulatory Visit: Payer: Self-pay | Admitting: Family Medicine

## 2024-09-30 DIAGNOSIS — I10 Essential (primary) hypertension: Secondary | ICD-10-CM

## 2024-10-07 ENCOUNTER — Telehealth: Payer: Self-pay | Admitting: *Deleted

## 2024-10-07 NOTE — Telephone Encounter (Signed)
 RN attempted to call patient. No answer. Left voicemail informing colonoscopy will be preformed at [facility] endo on 10-22-2024 with Dr. Avram.  Reminded patient her video pre-visit appointment is on 10/12/2024 at 10:30AM, and would go over further details and instructions then. Informed patient to call [facility] if any further questions.

## 2024-10-07 NOTE — Telephone Encounter (Signed)
 Pts procedure is scheduled with Dr. Avram at [facility] endo  Thank you

## 2024-10-07 NOTE — Telephone Encounter (Signed)
  Team,  This pt is a documented difficult intubation and her procedure will need to be done at the hospital.   Thanks,  Norleen EMERSON Schillings

## 2024-10-12 ENCOUNTER — Ambulatory Visit

## 2024-10-12 ENCOUNTER — Telehealth: Payer: Self-pay

## 2024-10-12 VITALS — Ht 67.0 in | Wt 290.0 lb

## 2024-10-12 DIAGNOSIS — Z1211 Encounter for screening for malignant neoplasm of colon: Secondary | ICD-10-CM

## 2024-10-12 MED ORDER — NA SULFATE-K SULFATE-MG SULF 17.5-3.13-1.6 GM/177ML PO SOLN: 1.0000 | Freq: Once | ORAL | 0 refills | Status: AC

## 2024-10-12 NOTE — Progress Notes (Signed)
 No egg or soy allergy known to patient  No issues known to pt with past sedation with any surgeries or procedures Patient is a difficult intubation and having procedure at [facility]  No FH of Malignant Hyperthermia Pt is not on diet pills Pt is not on  home 02  Pt is not on blood thinners  Pt denies issues with constipation  No A fib or A flutter Have any cardiac testing pending--No Pt can ambulate  Pt denies use of chewing tobacco Discussed diabetic I weight loss medication holds Discussed NSAID holds Checked BMI Pt instructed to use Single[employer] or GoodRx for a price reduction on prep  Patient's chart reviewed

## 2024-10-12 NOTE — Telephone Encounter (Signed)
 Opened in error

## 2024-10-13 DIAGNOSIS — G4733 Obstructive sleep apnea (adult) (pediatric): Secondary | ICD-10-CM | POA: Diagnosis not present

## 2024-10-14 ENCOUNTER — Telehealth: Payer: Self-pay | Admitting: Gastroenterology

## 2024-10-14 ENCOUNTER — Encounter: Payer: Self-pay | Admitting: Internal Medicine

## 2024-10-14 NOTE — Telephone Encounter (Addendum)
 Procedure:Colonoscopy Procedure date: 10/22/24 Procedure location: [facility] Arrival Time: 9:30 am Spoke with the patient Y/N:   No, I left a detailed message on [phone] on 10/14/24 @ 11:35 am for the patient to return call Yes, 10/15/24 @ 10:26 am    Any prep concerns? No  Has the patient obtained the prep from the pharmacy ? No but will pick it up today Do you have a care partner and transportation: Yes Any additional concerns? No

## 2024-10-16 ENCOUNTER — Encounter (HOSPITAL_COMMUNITY): Payer: Self-pay | Admitting: Internal Medicine

## 2024-10-19 ENCOUNTER — Ambulatory Visit: Admitting: Family Medicine

## 2024-10-19 ENCOUNTER — Encounter: Payer: Self-pay | Admitting: Radiology

## 2024-10-19 VITALS — BP 121/74 | HR 78 | Wt 305.0 lb

## 2024-10-19 DIAGNOSIS — R1024 Suprapubic pain: Secondary | ICD-10-CM | POA: Diagnosis not present

## 2024-10-19 LAB — POCT URINALYSIS DIP (MANUAL ENTRY)
Bilirubin, UA: NEGATIVE
Glucose, UA: NEGATIVE mg/dL
Ketones, POC UA: NEGATIVE mg/dL
Nitrite, UA: NEGATIVE
Spec Grav, UA: 1.025 (ref 1.010–1.025)
Urobilinogen, UA: 2 U/dL — AB
pH, UA: 6 (ref 5.0–8.0)

## 2024-10-19 MED ORDER — TAMSULOSIN HCL 0.4 MG PO CAPS: 0.4000 mg | ORAL_CAPSULE | Freq: Every day | ORAL | 0 refills | Status: AC | PRN

## 2024-10-19 NOTE — Telephone Encounter (Signed)
 PT is calling to have procedure rescheduled. She has kidney stones and her doctor wants to do a CT to fix them. Please advise of available appointments.

## 2024-10-19 NOTE — Patient Instructions (Signed)
 It was wonderful to see you today.  Please bring ALL of your medications with you to every visit.   Today we talked about:  Abdominal pain - you may have a kidney stone or urinary tract infection based on your symptoms. I am going to get some labs and have ordered a CT stone study. We have a follow up scheduled in 2 days. Until then please stay hydrated. I have prescribed a medication called flomax that you can take if needed. Please avoid NSAIDs due to your history of gastric bypass. Take tylenol  for pain.   Try to get your colonoscopy post poned one week if possible .   Thank you for choosing [facility].   Please call [phone] with any questions about today's appointment.   [name], MD  Family Medicine

## 2024-10-19 NOTE — Telephone Encounter (Signed)
 Patient hospital procedure date changed to 11/16/24 @ 9:30 am CASE ID  [id number] Rick)  Patient aware of new date and time , new instructions sent through MyChart.

## 2024-10-19 NOTE — Progress Notes (Signed)
" ° °  SUBJECTIVE:   CHIEF COMPLAINT / HPI:  Discussed the use of AI scribe software for clinical note transcription with the patient, who gave verbal consent to proceed.  History of Present Illness Paula Austin is a 58 year old female who presents with abdominal cramping and back pain.  Abdominal and back pain - Onset late Friday night - Pain resembles menstrual cramps, not accompanied by any vaginal bleeding. - Pain located in the abdomen and back - Over-the-counter medications (Advil, Aleve, Tylenol ) provide temporary relief  Urinary symptoms started next day on Saturday - Urine is darker yellow with noticeable odor - Sensation of possible stones causing irritation  Constitutional and gastrointestinal symptoms - Suspected low-grade fever over the weekend, not measured - Nausea upon waking this morning  Relevant medical history - History of kidney stones, though she is not sure how she knows about this history could have been found on imaging or she was told that she may have kidney stones.  However did not have this presentation at those times.  Those  were years ago. - History of gallstones, resolved by cholecystectomy    PERTINENT  PMH / PSH: HTN, OSA, Hypothyroidism, H/o gastric bypass  OBJECTIVE:  BP 121/74   Pulse 78   Wt (!) 305 lb (138.3 kg)   LMP 04/29/2016   SpO2 100%   BMI 47.77 kg/m   Afebrile  General: well appearing, in no acute distress Resp: Normal work of breathing on room air Abd: Soft, non distended, tender to palpation in suprapubic area and mildly tender in lower left and right quadrants.  No CVA tenderness Neuro: Alert & Oriented x 4   ASSESSMENT/PLAN:   Assessment & Plan Suprapubic pain, acute Suspected nephrolithiasis due to pain characteristics and history of kidney stones. Differential includes urinary tract infection.  Less likely pyelonephritis or very large stone given patient presentation. -UA, CBC, BMP - Prescribe Flomax  as needed  for assistance of stone passage. - Recommend Tylenol  for pain. - Avoid ibuprofen due to gastric bypass history. - Schedule follow-up for Wednesday 9:10 AM. -Patient has colonoscopy scheduled for Thursday, advised to consider postponing colonoscopy for another week    [name], MD [facility] "

## 2024-10-20 DIAGNOSIS — R1024 Suprapubic pain: Secondary | ICD-10-CM | POA: Diagnosis not present

## 2024-10-20 LAB — BASIC METABOLIC PANEL WITH GFR
BUN/Creatinine Ratio: 16 (ref 12–28)
BUN: 11 mg/dL (ref 8–27)
CO2: 22 mmol/L (ref 20–29)
Calcium: 9.5 mg/dL (ref 8.7–10.3)
Chloride: 101 mmol/L (ref 96–106)
Creatinine, Ser: 0.67 mg/dL (ref 0.57–1.00)
Glucose: 89 mg/dL (ref 70–99)
Potassium: 4.3 mmol/L (ref 3.5–5.2)
Sodium: 137 mmol/L (ref 134–144)
eGFR: 100 mL/min/1.73 (ref >59)

## 2024-10-20 LAB — CBC WITH DIFFERENTIAL/PLATELET
Basophils Absolute: 0 x10E3/uL (ref 0.0–0.2)
Basos: 0 %
EOS (ABSOLUTE): 0.3 x10E3/uL (ref 0.0–0.4)
Eos: 3 %
Hematocrit: 38 % (ref 34.0–46.6)
Hemoglobin: 12.6 g/dL (ref 11.1–15.9)
Immature Grans (Abs): 0 x10E3/uL (ref 0.0–0.1)
Immature Granulocytes: 0 %
Lymphocytes Absolute: 2.4 x10E3/uL (ref 0.7–3.1)
Lymphs: 20 %
MCH: 31.3 pg (ref 26.6–33.0)
MCHC: 33.2 g/dL (ref 31.5–35.7)
MCV: 94 fL (ref 79–97)
Monocytes Absolute: 0.9 x10E3/uL (ref 0.1–0.9)
Monocytes: 7 %
Neutrophils Absolute: 8.6 x10E3/uL — ABNORMAL HIGH (ref 1.4–7.0)
Neutrophils: 70 %
Platelets: 264 x10E3/uL (ref 150–450)
RBC: 4.03 x10E6/uL (ref 3.77–5.28)
RDW: 12.7 % (ref 11.7–15.4)
WBC: 12.2 x10E3/uL — ABNORMAL HIGH (ref 3.4–10.8)

## 2024-10-20 LAB — POCT UA - MICROSCOPIC ONLY: Epithelial cells, urine per micros: >20

## 2024-10-20 NOTE — Addendum Note (Signed)
 Addended by: Candiace West on: 10/20/2024 08:55 AM   Modules accepted: Orders

## 2024-10-21 ENCOUNTER — Ambulatory Visit (INDEPENDENT_AMBULATORY_CARE_PROVIDER_SITE_OTHER): Payer: Self-pay | Admitting: Family Medicine

## 2024-10-21 ENCOUNTER — Other Ambulatory Visit (HOSPITAL_COMMUNITY)
Admission: RE | Admit: 2024-10-21 | Discharge: 2024-10-21 | Disposition: A | Discharge status: Home / Self Care | Source: Ambulatory Visit | Attending: Family Medicine | Admitting: Family Medicine

## 2024-10-21 ENCOUNTER — Encounter: Payer: Self-pay | Admitting: Family Medicine

## 2024-10-21 VITALS — BP 137/89 | HR 80 | Temp 98.3°F | Ht 67.0 in

## 2024-10-21 DIAGNOSIS — R1024 Suprapubic pain: Secondary | ICD-10-CM | POA: Insufficient documentation

## 2024-10-21 MED ORDER — CIPROFLOXACIN HCL 500 MG PO TABS: 500.0000 mg | ORAL_TABLET | Freq: Two times a day (BID) | ORAL | 0 refills | Status: DC

## 2024-10-21 MED ORDER — ONDANSETRON 4 MG PO TBDP: 4.0000 mg | ORAL_TABLET | Freq: Three times a day (TID) | ORAL | 0 refills | Status: AC | PRN

## 2024-10-21 MED ORDER — METRONIDAZOLE 500 MG PO TABS: 500.0000 mg | ORAL_TABLET | Freq: Four times a day (QID) | ORAL | 0 refills | Status: DC

## 2024-10-21 NOTE — Patient Instructions (Signed)
 It was wonderful to see you today.  Please bring ALL of your medications with you to every visit.   Today we talked about:  Abdominal pain - this could be due to a lot of  factors. We are still waiting on your CT to be approved and waiting on your urine culture. In the meantime please take the flomax. I have also sent two antibiotics. Take tylenol  1000 mg every 6 hours. Please let us  know if you get worse before Monday   Please follow up on Monday    Thank you for choosing [facility].   Please call [phone] with any questions about today's appointment.  Please be sure to schedule follow up at the front desk before you leave today.   [name], MD  Family Medicine

## 2024-10-21 NOTE — Progress Notes (Signed)
" ° °  SUBJECTIVE:   CHIEF COMPLAINT / HPI:  Discussed the use of AI scribe software for clinical note transcription with the patient, who gave verbal consent to proceed.  History of Present Illness Paula Austin is a 58 year old female who presents with nausea, vomiting, and abdominal pain.  Seen 2 days ago for cramping abdominal pain and some nausea. At that time was a concern for possible UTI or kidney stone.   Gastrointestinal symptoms - Nausea and vomiting have worsened over the past two days - Small episode of vomiting this morning - Appetite present but minimal food intake despite feeling hungry  Abdominal and back pain - Abdominal and back pain have worsened, primarily on the right side - Pain described as 'flutter' and pressure in the urinary tract - Pain intensity rated as 8.5 out of 10 - Constant pain with a 'crampy' sensation similar to menstrual cramps. Although no vaginal bleeding.  - No fevers.  - Endorses dysuria.  - Tylenol  500 mg every four hours has been ineffective for pain - Regular bowel movements.      PERTINENT  PMH / PSH: HTN, OSA, H/o cholecystectomy, H/o gastric bypass   OBJECTIVE:  BP 137/89   Pulse 80   Temp 98.3 F (36.8 C)   LMP 04/29/2016   SpO2 100%    General: uncomfortable appearing, in no acute distress CV: RRR, radial pulses equal and palpable, no BLE edema  Resp: Normal work of breathing on room air, CTAB Abd: Soft, non distended, patient reports pain in lower abdomen, but no worsening tenderness with palpation, no guarding, no hepatomegaly, No CVA tenderness   GU (chaperoned by CMA): no abnormal discharge, no CMT, bimanual exam with difficulty palpating ovaries, uterus with normal contours.  Neuro: Alert & Oriented   ASSESSMENT/PLAN:   Assessment & Plan Suprapubic pain, acute Is now suprapubic/right lower abdominal pain.  Given afebrile with pretty benign abdominal exam unlikely appendicitis.  Urinalysis less likely UTI however  patient did have leukocytosis on CBC.  Patient could have diverticulitis or PID.  Low risk given 1 sexual partner and no history of STIs.  Could also still have renal stone unlikely large or obstructing.  Less likely ovarian concern patient did not have cervical motion tenderness, no history of STIs, ovaries most likely very atrophic at this age. - Increased Tylenol  to 1000 mg every six hours for pain. - Prescribed antibiotics for potential urinary tract infection and/or diverticulitis - Will follow-up on CT scan for urinary tract stone evaluation. - Prescribed antiemetic for nausea. - Tested for STIs.  [name], MD [facility] "

## 2024-10-22 ENCOUNTER — Ambulatory Visit: Payer: Self-pay | Admitting: Family Medicine

## 2024-10-22 LAB — URINE CULTURE

## 2024-10-23 LAB — CERVICOVAGINAL ANCILLARY ONLY
Chlamydia: NEGATIVE
Comment: NEGATIVE
Comment: NEGATIVE
Comment: NORMAL
Neisseria Gonorrhea: NEGATIVE
Trichomonas: POSITIVE — AB

## 2024-10-26 ENCOUNTER — Ambulatory Visit: Payer: Self-pay | Admitting: Family Medicine

## 2024-10-26 ENCOUNTER — Ambulatory Visit: Admitting: Family Medicine

## 2024-10-26 ENCOUNTER — Encounter: Payer: Self-pay | Admitting: Family Medicine

## 2024-10-26 VITALS — BP 121/70 | HR 80 | Ht 67.0 in

## 2024-10-26 DIAGNOSIS — R1031 Right lower quadrant pain: Secondary | ICD-10-CM | POA: Insufficient documentation

## 2024-10-26 NOTE — Progress Notes (Signed)
   SUBJECTIVE:   CHIEF COMPLAINT / HPI:   Discussed the use of AI scribe software for clinical note transcription with the patient, who gave verbal consent to proceed.  History of Present Illness AKAYA PROFFIT is a 58 year old female who presents with persistent lower abdominal pain and recent diagnosis of trichomonas infection. She is accompanied by her husband.  Lower abdominal pain - Persistent pain localized to the lower abdomen, primarily on the right side, for the past week - Pain described as excruciating, with a sensation of gas and bubbles in the abdomen and back - Pain is sore during urination - Pain began to ease only the day before the visit after a week of severity - Tylenol  did not alleviate the pain - New onset diarrhea began yesterday - Episodes of nausea without vomiting - No burning or blood in urine - New onset back pain described as feeling gassy - No vaginal bleeding or abnormal discharge, postmenopausal - history of kidney stones, obtaining CT renal study Wednesday. - h/o cholecystectomy  Trichomonas infection - Recent diagnosis of trichomonas infection - Completing a course of metronidazole, with last dose today - Concern regarding source of infection due to monogamous relationship  Planned procedures - Scheduled for colonoscopy in early December   OBJECTIVE:   BP 121/70   Pulse 80   Ht 5' 7 (1.702 m)   LMP 04/29/2016   SpO2 100%   BMI 47.77 kg/m   Gen: well appearing, in NAD Card: RRR Lungs: CTAB Abd: soft, TTP in RLQ, no rebound tenderness or guarding. Ext: WWP, no edema  ASSESSMENT/PLAN:   RLQ abdominal pain Persistent but with some improvement with antibiotics. Etiology remains unclear however considering PID with +trich, diverticulitis, renal stone. Await CT renal study, finishing antibiotics today. 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 Will plan  to retest for trich in 3 weeks. If symptoms remain after treatment and CT renal study negative, plan to f/u with pelvic US .   STI Completing treatment today. Patient evaluated for EPT and meets criteria. Prescription completed, partner name, DOB and allergies confirmed with partner in room. Prescription faxed to pharmacy by nursing staff. Copy given to nursing team for records.     Donald CHRISTELLA Lai, DO

## 2024-10-26 NOTE — Patient Instructions (Signed)
 It was great to see you!  Our plans for today:  - Finish your antibiotics. Try probiotics for diarrhea - We will let you know the results of your CT scan.  - You can continue to take tylenol /ibuprofen for pain.  - We will do infectious retesting in about 3 weeks.   Take care and seek immediate care sooner if you develop any concerns.   Dr. Andrienne Havener

## 2024-10-26 NOTE — Assessment & Plan Note (Signed)
 Persistent but with some improvement with antibiotics. Etiology remains unclear however considering PID with +trich, diverticulitis, renal stone. Await CT renal study, finishing antibiotics today. 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 Will plan to retest for trich in 3 weeks. If symptoms remain after treatment and CT renal study negative, plan to f/u with pelvic US .

## 2024-10-26 NOTE — Progress Notes (Signed)
 Discussed at appt with Dr. Madelon on 11/10. Will follow up CT renal that she is already scheduled for and do further PID workup if necessary. Dr. Rumball to treat trich.

## 2024-10-28 ENCOUNTER — Ambulatory Visit
Admission: RE | Admit: 2024-10-28 | Discharge: 2024-10-28 | Disposition: A | Discharge status: Home / Self Care | Source: Ambulatory Visit | Attending: Family Medicine | Admitting: Family Medicine

## 2024-10-28 ENCOUNTER — Telehealth: Payer: Self-pay

## 2024-10-28 DIAGNOSIS — N200 Calculus of kidney: Secondary | ICD-10-CM | POA: Diagnosis not present

## 2024-10-28 DIAGNOSIS — R1024 Suprapubic pain: Secondary | ICD-10-CM

## 2024-10-28 NOTE — Telephone Encounter (Signed)
 Patient calls nurse line regarding issues with her husband picking up prescription for metronidazole.   Husband is not patient in our office, however, was receiving EPT.   Faxed prescription to [facility].   Chiquita JAYSON English, RN

## 2024-10-29 NOTE — Telephone Encounter (Signed)
 Discussed CT results with Dr. Madelon. Patient has small non obstructing stone. No evidence of PID on CT. If symptoms continue can consider pelvic ultrasound. Otherwise recommend hydration for patient to help with stone.   Patient did not answer. Will send this as mychart message as well.

## 2024-11-02 ENCOUNTER — Other Ambulatory Visit: Payer: Self-pay | Admitting: Family Medicine

## 2024-11-02 DIAGNOSIS — E88819 Insulin resistance, unspecified: Secondary | ICD-10-CM

## 2024-11-02 NOTE — Telephone Encounter (Signed)
 Called patient per Dr. Roark request to clarify if she is taking the Metformin  medication.  Patient stated that she is taking the medication metformin .  Harlene Reiter, CMA

## 2024-11-05 ENCOUNTER — Encounter (HOSPITAL_COMMUNITY): Payer: Self-pay | Admitting: Internal Medicine

## 2024-11-05 NOTE — Progress Notes (Signed)
 Attempted to obtain medical history for pre op call via telephone, unable to reach at this time. HIPAA compliant voicemail message left requesting return call to pre surgical testing department.

## 2024-11-10 ENCOUNTER — Telehealth: Payer: Self-pay

## 2024-11-10 NOTE — Telephone Encounter (Signed)
 Procedure:COLON Procedure date: 11/16/24 Procedure location: [facility] Arrival Time: 8:03 Spoke with the patient Y/N: Y Any prep concerns? N  Has the patient obtained the prep from the pharmacy ? Y Do you have a care partner and transportation: Y Any additional concerns? N

## 2024-11-12 ENCOUNTER — Other Ambulatory Visit: Payer: Self-pay | Admitting: Family Medicine

## 2024-11-13 DIAGNOSIS — G4733 Obstructive sleep apnea (adult) (pediatric): Secondary | ICD-10-CM | POA: Diagnosis not present

## 2024-11-15 DIAGNOSIS — T884XXA Failed or difficult intubation, initial encounter: Secondary | ICD-10-CM | POA: Insufficient documentation

## 2024-11-15 NOTE — H&P (Signed)
 [facility] History and Physical   Primary Care Physician:  Madelon Donald HERO, DO   Reason for Procedure:  Colon cancer screening  Plan:    Colonoscopy, to be performed in the hospital setting because of a history of difficult intubation   The patient was provided an opportunity to ask questions and all were answered. The patient agreed with the plan.   HPI: Paula Austin is a 58 y.o. female presenting for a screening colonoscopy.  She had a normal colonoscopy performed by Dr. Teressa in 2015.   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   OSA on CPAP    uses cpap some nights, pt does know settings   PONV (postoperative nausea and vomiting) 08/17/2013   lots of coughing up phelgm   Pre-diabetes    Sleep apnea    cpap machine   Thyroid  disease     Past Surgical History:  Procedure Laterality Date   BREAST SURGERY Bilateral 11/15/06   Reduction   BREATH TEK H PYLORI N/A 05/26/2013   Procedure: BREATH TEK H PYLORI;  Surgeon: Donnice KATHEE Lunger, MD;  Location: [facility];  Service: General;  Laterality: N/A;   CHOLECYSTECTOMY N/A 12/16/2023   Procedure: LAPAROSCOPIC CHOLECYSTECTOMY;  Surgeon: Dasie Leonor CROME, MD;  Location: [facility];  Service: General;  Laterality: N/A;   COLONOSCOPY WITH PROPOFOL  N/A 06/17/2014   Procedure: COLONOSCOPY WITH PROPOFOL ;  Surgeon: Toribio SHAUNNA Teressa, MD;  Location: [facility];  Service: Endoscopy;  Laterality: N/A;   GASTRIC ROUX-EN-Y N/A  09/07/2013   Procedure: LAPAROSCOPIC ROUX-EN-Y GASTRIC upper endoscopy;  Surgeon: Donnice KATHEE Lunger, MD;  Location: [facility];  Service: General;  Laterality: N/A;   THYROIDECTOMY  10/30/02   TUBAL LIGATION  08/25/98   had hard time getting ET tube down per pt.      No current facility-administered medications for this encounter.   Current Outpatient Medications  Medication Sig Dispense Refill   ciprofloxacin  (CIPRO ) 500 MG tablet Take 1 tablet (500 mg total) by mouth 2 (two) times daily. 6 tablet 0   cyanocobalamin  1000 MCG tablet Take 1,000 mcg by mouth daily.      levothyroxine  (SYNTHROID ) 150 MCG tablet Take 1 tablet (150 mcg total) by mouth daily before breakfast. 90 tablet 3   lisinopril -hydrochlorothiazide  (ZESTORETIC ) 20-25 MG tablet Take 1 tablet by mouth once daily 90 tablet 3   metFORMIN  (GLUCOPHAGE ) 500 MG tablet TAKE 1 TABLET BY MOUTH EVERY DAY WITH BREAKFAST 90 tablet 3   metroNIDAZOLE  (FLAGYL ) 500 MG tablet Take 1 tablet (500 mg total) by mouth 4 (four) times daily. 21 tablet 0   ondansetron  (ZOFRAN -ODT) 4 MG disintegrating tablet Take 1 tablet (4 mg total) by mouth every 8 (eight) hours as needed for nausea or vomiting. 15 tablet 0   tamsulosin  (FLOMAX ) 0.4 MG CAPS capsule Take 1 capsule (0.4 mg total) by mouth daily as needed. 30 capsule 0  Allergies as of 09/18/2024 - Review Complete 07/27/2024  Allergen Reaction Noted   Oxycodone Anxiety 06/17/2013    Family History  Problem Relation Age of Onset   Thyroid  disease Mother    Hypertension Mother    Hyperlipidemia Mother    Obesity Mother    Hyperlipidemia Father    Diabetes Father    Sleep apnea Father    Stomach cancer Maternal Aunt    Cancer Maternal Grandfather        lung   Diabetes Paternal Grandmother    Colon cancer Neg Hx    Rectal cancer Neg Hx    Esophageal cancer Neg Hx     Social History   Socioeconomic History   Marital status: Married    Spouse name: Not on file   Number of children: Not on  file   Years of education: Not on file   Highest education level: Not on file  Occupational History   Not on file  Tobacco Use   Smoking status: Former   Smokeless tobacco: Never  Vaping Use   Vaping status: Never Used  Substance and Sexual Activity   Alcohol use: Not Currently   Drug use: No   Sexual activity: Not on file  Other Topics Concern   Not on file  Social History Narrative   Not on file   Social Drivers of Health   Financial Resource Strain: Not on file  Food Insecurity: Not on file  Transportation Needs: Not on file  Physical Activity: Not on file  Stress: Not on file  Social Connections: Not on file  Intimate Partner Violence: Not on file    Review of Systems: Positive for *** All other review of systems negative except as mentioned in the HPI.  Physical Exam: Vital signs LMP 04/29/2016   General:   Alert,  Well-developed, well-nourished, pleasant and cooperative in NAD Lungs:  Clear throughout to auscultation.   Heart:  Regular rate and rhythm; no murmurs, clicks, rubs,  or gallops. Abdomen:  Soft, nontender and nondistended. Normal bowel sounds.   Neuro/Psych:  Alert and cooperative. Normal mood and affect. A and O x 3   @Arnisha Laffoon  CHARLENA Commander, MD, [facility] [phone] (pager) 11/15/2024 9:05 PM@

## 2024-11-16 ENCOUNTER — Encounter (HOSPITAL_COMMUNITY): Payer: Self-pay | Admitting: Internal Medicine

## 2024-11-16 ENCOUNTER — Ambulatory Visit (HOSPITAL_COMMUNITY): Admitting: Certified Registered Nurse Anesthetist

## 2024-11-16 ENCOUNTER — Encounter (HOSPITAL_COMMUNITY)
Admission: RE | Disposition: A | Discharge status: Home / Self Care | Payer: Self-pay | Source: Home / Self Care | Attending: Internal Medicine

## 2024-11-16 ENCOUNTER — Ambulatory Visit (HOSPITAL_COMMUNITY)
Admission: RE | Admit: 2024-11-16 | Discharge: 2024-11-16 | Disposition: A | Attending: Internal Medicine | Admitting: Internal Medicine

## 2024-11-16 ENCOUNTER — Other Ambulatory Visit: Payer: Self-pay

## 2024-11-16 DIAGNOSIS — Z1211 Encounter for screening for malignant neoplasm of colon: Secondary | ICD-10-CM | POA: Diagnosis not present

## 2024-11-16 DIAGNOSIS — T884XXA Failed or difficult intubation, initial encounter: Secondary | ICD-10-CM | POA: Insufficient documentation

## 2024-11-16 DIAGNOSIS — G4733 Obstructive sleep apnea (adult) (pediatric): Secondary | ICD-10-CM | POA: Diagnosis not present

## 2024-11-16 DIAGNOSIS — I10 Essential (primary) hypertension: Secondary | ICD-10-CM | POA: Diagnosis not present

## 2024-11-16 DIAGNOSIS — Z87891 Personal history of nicotine dependence: Secondary | ICD-10-CM | POA: Diagnosis not present

## 2024-11-16 HISTORY — PX: COLONOSCOPY: SHX5424

## 2024-11-16 SURGERY — COLONOSCOPY
Anesthesia: Monitor Anesthesia Care

## 2024-11-16 MED ORDER — PROPOFOL 10 MG/ML IV BOLUS
INTRAVENOUS | Status: DC | PRN
  Administered 2024-11-16: 30 mg via INTRAVENOUS

## 2024-11-16 MED ORDER — LIDOCAINE 2% (20 MG/ML) 5 ML SYRINGE
INTRAMUSCULAR | Status: DC | PRN
  Administered 2024-11-16: 40 mg via INTRAVENOUS

## 2024-11-16 MED ORDER — SODIUM CHLORIDE 0.9 % IV SOLN: INTRAVENOUS | Status: DC

## 2024-11-16 MED ORDER — PROPOFOL 500 MG/50ML IV EMUL
INTRAVENOUS | Status: DC | PRN
  Administered 2024-11-16: 125 ug/kg/min via INTRAVENOUS

## 2024-11-16 NOTE — Op Note (Signed)
 [facility] Patient Name: Paula Austin Procedure Date: 11/16/2024 MRN: [id number] Attending MD: Lupita FORBES Commander , MD, [id number] Date of Birth: [birth year] CSN: [id number] Age: 58 Admit Type: Outpatient Procedure:                Colonoscopy Indications:              Screening for colorectal malignant neoplasm, Last                            colonoscopy: 2015 Providers:                Lupita CHARLENA Commander, MD, Ozell Pouch, Coye Bade, Technician Referring MD:              Medicines:                Monitored Anesthesia Care Complications:            No immediate complications. Estimated Blood Loss:     Estimated blood loss: none. Procedure:                Pre-Anesthesia Assessment:                           - Prior to the procedure, a History and Physical                            was performed, and patient medications and                            allergies were reviewed. The patient's tolerance of                            previous anesthesia was also reviewed. The risks                            and benefits of the procedure and the sedation                            options and risks were discussed with the patient.                            All questions were answered, and informed consent                            was obtained. Prior Anticoagulants: The patient has                            taken no anticoagulant or antiplatelet agents. ASA                            Grade Assessment: II - A patient with mild systemic                            disease.  After reviewing the risks and benefits,                            the patient was deemed in satisfactory condition to                            undergo the procedure.                           After obtaining informed consent, the colonoscope                            was passed under direct vision. Throughout the                            procedure, the patient's  blood pressure, pulse, and                            oxygen saturations were monitored continuously. The                            CF-HQ190L ([id number]) Olympus colonoscope was                            introduced through the anus and advanced to the the                            cecum, identified by appendiceal orifice and                            ileocecal valve. The colonoscopy was performed                            without difficulty. The patient tolerated the                            procedure well. The quality of the bowel                            preparation was excellent. The ileocecal valve,                            appendiceal orifice, and rectum were photographed.                            The bowel preparation used was SUPREP via split                            dose instruction. Scope In: 9:46:44 AM Scope Out: 9:56:39 AM Scope Withdrawal Time: 0 hours 6 minutes 49 seconds  Total Procedure Duration: 0 hours 9 minutes 55 seconds  Findings:      The perianal and digital rectal examinations were normal.      The entire examined colon appeared normal on direct and retroflexion       views.  Impression:               - The entire examined colon is normal on direct and                            retroflexion views.                           - No specimens collected. Moderate Sedation:      Not Applicable - Patient had care per Anesthesia. Recommendation:           - Patient has a contact number available for                            emergencies. The signs and symptoms of potential                            delayed complications were discussed with the                            patient. Return to normal activities tomorrow.                            Written discharge instructions were provided to the                            patient.                           - Resume previous diet.                           - Continue present medications.                            - Repeat colonoscopy in 10 days for screening                            purposes. Procedure Code(s):        --- Professional ---                           H9878, Colorectal cancer screening; colonoscopy on                            individual not meeting criteria for high risk Diagnosis Code(s):        --- Professional ---                           Z12.11, Encounter for screening for malignant                            neoplasm of colon CPT copyright 2022 American [employer] Association. All rights reserved. The codes documented in this report are preliminary and upon coder review may  be revised to meet current compliance requirements. Lupita FORBES Commander, MD 11/16/2024 10:03:34 AM This report has been signed electronically. Number of  Addenda: 0

## 2024-11-16 NOTE — Anesthesia Preprocedure Evaluation (Signed)
 Anesthesia Evaluation  Patient identified by MRN, date of birth, ID band Patient awake    Reviewed: Allergy & Precautions, NPO status , Patient's Chart, lab work & pertinent test results, reviewed documented beta blocker date and time   History of Anesthesia Complications (+) PONV, DIFFICULT AIRWAY and history of anesthetic complications  Airway Mallampati: III  TM Distance: >3 FB     Dental no notable dental hx.    Pulmonary sleep apnea and Continuous Positive Airway Pressure Ventilation , neg COPD, former smoker   breath sounds clear to auscultation       Cardiovascular hypertension, (-) angina (-) CAD and (-) Past MI  Rhythm:Regular Rate:Normal     Neuro/Psych neg Seizures    GI/Hepatic ,GERD  ,,(+) neg Cirrhosis        Endo/Other  Hypothyroidism    Renal/GU Renal disease     Musculoskeletal  (+) Arthritis ,    Abdominal   Peds  Hematology   Anesthesia Other Findings   Reproductive/Obstetrics                              Anesthesia Physical Anesthesia Plan  ASA: 2  Anesthesia Plan: MAC   Post-op Pain Management:    Induction: Intravenous  PONV Risk Score and Plan: 3 and Ondansetron  and Dexamethasone   Airway Management Planned: Natural Airway and Simple Face Mask  Additional Equipment:   Intra-op Plan:   Post-operative Plan: Extubation in OR  Informed Consent: I have reviewed the patients History and Physical, chart, labs and discussed the procedure including the risks, benefits and alternatives for the proposed anesthesia with the patient or authorized representative who has indicated his/her understanding and acceptance.     Dental advisory given  Plan Discussed with: CRNA  Anesthesia Plan Comments:         Anesthesia Quick Evaluation

## 2024-11-16 NOTE — Transfer of Care (Signed)
 Immediate Anesthesia Transfer of Care Note  Patient: Paula Austin  Procedure(s) Performed: COLONOSCOPY  Patient Location: PACU  Anesthesia Type:MAC  Level of Consciousness: drowsy  Airway & Oxygen Therapy: Patient Spontanous Breathing and Patient connected to nasal cannula oxygen  Post-op Assessment: Report given to RN and Post -op Vital signs reviewed and stable  Post vital signs: Reviewed and stable  Last Vitals:  Vitals Value Taken Time  BP 102/60 11/16/24 10:02  Temp 36.6 C 11/16/24 10:02  Pulse 86 11/16/24 10:03  Resp 26 11/16/24 10:03  SpO2 96 % 11/16/24 10:03  Vitals shown include unfiled device data.  Last Pain:  Vitals:   11/16/24 1002  TempSrc: Temporal  PainSc: Asleep         Complications: No notable events documented.

## 2024-11-16 NOTE — Discharge Instructions (Signed)
 Normal exam - no polyps or cancer were seen.  Next routine colonoscopy or other screening test in 10 years - 2035.  I appreciate the opportunity to care for you. Lupita CHARLENA Commander, MD, FACG  YOU HAD AN ENDOSCOPIC PROCEDURE TODAY: Refer to the procedure report and other information in the discharge instructions given to you for any specific questions about what was found during the examination. If this information does not answer your questions, please call Dr. Darilyn office at [phone] to clarify.   YOU SHOULD EXPECT: Some feelings of bloating in the abdomen. Passage of more gas than usual. Walking can help get rid of the air that was put into your GI tract during the procedure and reduce the bloating. If you had a lower endoscopy (such as a colonoscopy or flexible sigmoidoscopy) you may notice spotting of blood in your stool or on the toilet paper. Some abdominal soreness may be present for a day or two, also.  DIET: Your first meal following the procedure should be a light meal and then it is ok to progress to your normal diet. A half-sandwich or bowl of soup is an example of a good first meal. Heavy or fried foods are harder to digest and may make you feel nauseous or bloated. Drink plenty of fluids but you should avoid alcoholic beverages for 24 hours.   ACTIVITY: Your care partner should take you home directly after the procedure. You should plan to take it easy, moving slowly for the rest of the day. You can resume normal activity the day after the procedure however YOU SHOULD NOT DRIVE, use power tools, machinery or perform tasks that involve climbing or major physical exertion for 24 hours (because of the sedation medicines used during the test).   SYMPTOMS TO REPORT IMMEDIATELY: A gastroenterologist can be reached at any hour. Please call [phone]  for any of the following symptoms:  Following lower endoscopy (colonoscopy, flexible sigmoidoscopy) Excessive amounts of blood in the  stool  Significant tenderness, worsening of abdominal pains  Swelling of the abdomen that is new, acute  Fever of 100 or higher

## 2024-11-16 NOTE — Telephone Encounter (Signed)
 No longer needed

## 2024-11-17 ENCOUNTER — Encounter (HOSPITAL_COMMUNITY): Payer: Self-pay | Admitting: Internal Medicine

## 2024-11-17 NOTE — Anesthesia Postprocedure Evaluation (Signed)
 Anesthesia Post Note  Patient: Paula Austin  Procedure(s) Performed: COLONOSCOPY     Patient location during evaluation: PACU Anesthesia Type: MAC Level of consciousness: awake and alert Pain management: pain level controlled Vital Signs Assessment: post-procedure vital signs reviewed and stable Respiratory status: spontaneous breathing, nonlabored ventilation, respiratory function stable and patient connected to nasal cannula oxygen Cardiovascular status: blood pressure returned to baseline and stable Postop Assessment: no apparent nausea or vomiting Anesthetic complications: no   No notable events documented.  Last Vitals:  Vitals:   11/16/24 1010 11/16/24 1018  BP: 119/82 103/78  Pulse: 86 71  Resp: 12 17  Temp:    SpO2: 95% 96%    Last Pain:  Vitals:   11/16/24 1005  TempSrc:   PainSc: 0-No pain                 Lynwood MARLA Cornea

## 2024-11-25 ENCOUNTER — Encounter: Payer: Self-pay | Admitting: Orthopaedic Surgery

## 2024-11-25 ENCOUNTER — Ambulatory Visit: Admitting: Orthopaedic Surgery

## 2024-11-25 DIAGNOSIS — M1712 Unilateral primary osteoarthritis, left knee: Secondary | ICD-10-CM | POA: Diagnosis not present

## 2024-11-25 DIAGNOSIS — G8929 Other chronic pain: Secondary | ICD-10-CM | POA: Diagnosis not present

## 2024-11-25 DIAGNOSIS — M25562 Pain in left knee: Secondary | ICD-10-CM

## 2024-11-25 NOTE — Progress Notes (Signed)
 The patient is a 58 year old female well-known to us .  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  She responds most to hyaluronic acid injections.  She last had hyaluronic acid injection in her left knee back in May of this year to treat the pain from osteoarthritis.  That has done well for her and she is interested in having this type of injection again.  She has tried and failed other forms of conservative treatment.  She is diabetic but under good control.  She is working on weight loss and is in water aerobics now as well.  Examination of her left knee shows varus malalignment that is correctable.  There is medial joint line tenderness and patellofemoral crepitation to the arc of motion of her knee which is painful.  It is definitely arthritic pain.  I agree that she is a candidate again for hyaluronic acid since it has been 7 months since she has had that type of injection and it does work best to treat the pain from osteoarthritis of her left knee.  She has tried and failed other forms of conservative treatment as well.  Will work on getting this hopefully approved in order to that in the knee fracture and have her back in follow-up to place this injection in her left knee.  This patient is diagnosed with osteoarthritis of the knee(s).    Radiographs show evidence of joint space narrowing, osteophytes, subchondral sclerosis and/or subchondral cysts.  This patient has knee pain which interferes with functional and activities of daily living.    This patient has experienced inadequate response, adverse effects and/or intolerance with conservative treatments such as acetaminophen , NSAIDS, topical creams, physical therapy or regular exercise, knee bracing and/or weight loss.   This patient has experienced inadequate response or has a contraindication to intra articular steroid injections for at least 3 months.   This patient is not scheduled to have a total knee replacement within 6  months of starting treatment with viscosupplementation.

## 2024-11-26 ENCOUNTER — Other Ambulatory Visit: Payer: Self-pay

## 2024-11-26 DIAGNOSIS — M1712 Unilateral primary osteoarthritis, left knee: Secondary | ICD-10-CM

## 2024-11-26 DIAGNOSIS — G8929 Other chronic pain: Secondary | ICD-10-CM

## 2024-12-11 DIAGNOSIS — G4733 Obstructive sleep apnea (adult) (pediatric): Secondary | ICD-10-CM | POA: Diagnosis not present

## 2024-12-13 DIAGNOSIS — G4733 Obstructive sleep apnea (adult) (pediatric): Secondary | ICD-10-CM | POA: Diagnosis not present

## 2025-01-06 ENCOUNTER — Ambulatory Visit: Admitting: Orthopaedic Surgery

## 2025-01-14 ENCOUNTER — Encounter: Payer: Self-pay | Admitting: Physician Assistant

## 2025-01-14 ENCOUNTER — Ambulatory Visit: Admitting: Physician Assistant

## 2025-01-14 DIAGNOSIS — M1712 Unilateral primary osteoarthritis, left knee: Secondary | ICD-10-CM

## 2025-01-14 MED ORDER — HYALURONAN 88 MG/4ML IX SOSY
88.0000 mg | PREFILLED_SYRINGE | INTRA_ARTICULAR | Status: AC | PRN
  Administered 2025-01-14: 88 mg via INTRA_ARTICULAR

## 2025-01-14 NOTE — Progress Notes (Signed)
" ° °  Procedure Note  Patient: Paula Austin             Date of Birth: 03-Aug-1966           MRN: [id number]             Visit Date: 01/14/2025 This patient is diagnosed with osteoarthritis of the left knee    Radiographs show evidence of joint space narrowing, osteophytes, subchondral sclerosis and/or subchondral cysts.  This patient has knee pain which interferes with functional and activities of daily living.    This patient has experienced inadequate response, adverse effects and/or intolerance with conservative treatments such as acetaminophen , NSAIDS, topical creams, physical therapy or regular exercise, knee bracing and/or weight loss.   This patient has experienced inadequate response or has a contraindication to intra articular steroid injections for at least 3 months.   This patient is not scheduled to have a total knee replacement within 6 months of starting treatment with viscosupplementation.   Physical exam: Left knee: Good range of motion no abnormal warmth erythema or effusion. Procedures: Visit Diagnoses:  1. Unilateral primary osteoarthritis, left knee     Large Joint Inj: L knee on 01/14/2025 2:56 PM Indications: pain Details: 22 G 1.5 in needle, anterolateral approach  Arthrogram: No  Medications: 88 mg Hyaluronan 88 MG/4ML Outcome: tolerated well, no immediate complications Procedure, treatment alternatives, risks and benefits explained, specific risks discussed. Consent was given by the patient. Immediately prior to procedure a time out was called to verify the correct patient, procedure, equipment, support staff and site/side marked as required. Patient was prepped and draped in the usual sterile fashion.    Patient tolerated injection well.  Should follow-up with us  as needed.  Knows to wait at least 6 months between viscosupplementation injections.    "
